# Patient Record
Sex: Female | Born: 1950
Health system: Southern US, Community
[De-identification: ages and names within clinical notes are randomized; demographics above are authoritative.]

## PROBLEM LIST (undated history)

## (undated) DIAGNOSIS — G8929 Other chronic pain: Secondary | ICD-10-CM

## (undated) DIAGNOSIS — K601 Chronic anal fissure: Secondary | ICD-10-CM

## (undated) DIAGNOSIS — M545 Low back pain, unspecified: Secondary | ICD-10-CM

## (undated) DIAGNOSIS — K641 Second degree hemorrhoids: Secondary | ICD-10-CM

## (undated) DIAGNOSIS — Z973 Presence of spectacles and contact lenses: Secondary | ICD-10-CM

## (undated) DIAGNOSIS — K644 Residual hemorrhoidal skin tags: Secondary | ICD-10-CM

## (undated) DIAGNOSIS — F419 Anxiety disorder, unspecified: Secondary | ICD-10-CM

## (undated) DIAGNOSIS — Z86010 Personal history of colonic polyps: Secondary | ICD-10-CM

## (undated) DIAGNOSIS — Z860101 Personal history of adenomatous and serrated colon polyps: Secondary | ICD-10-CM

## (undated) DIAGNOSIS — I10 Essential (primary) hypertension: Secondary | ICD-10-CM

## (undated) DIAGNOSIS — M1990 Unspecified osteoarthritis, unspecified site: Secondary | ICD-10-CM

## (undated) HISTORY — DX: Essential (primary) hypertension: I10

## (undated) HISTORY — DX: Anxiety disorder, unspecified: F41.9

## (undated) HISTORY — PX: BREAST BIOPSY: SHX20

## (undated) HISTORY — PX: OTHER SURGICAL HISTORY: SHX169

---

## 2014-02-23 ENCOUNTER — Emergency Department (HOSPITAL_COMMUNITY)
Admission: EM | Admit: 2014-02-23 | Discharge: 2014-02-23 | Disposition: A | Discharge status: Home / Self Care | Payer: Self-pay | Attending: Emergency Medicine | Admitting: Emergency Medicine

## 2014-02-23 ENCOUNTER — Encounter (HOSPITAL_COMMUNITY): Payer: Self-pay | Admitting: Emergency Medicine

## 2014-02-23 DIAGNOSIS — M5430 Sciatica, unspecified side: Secondary | ICD-10-CM | POA: Insufficient documentation

## 2014-02-23 DIAGNOSIS — Z880 Allergy status to penicillin: Secondary | ICD-10-CM | POA: Insufficient documentation

## 2014-02-23 DIAGNOSIS — R030 Elevated blood-pressure reading, without diagnosis of hypertension: Secondary | ICD-10-CM

## 2014-02-23 DIAGNOSIS — M25559 Pain in unspecified hip: Secondary | ICD-10-CM | POA: Insufficient documentation

## 2014-02-23 DIAGNOSIS — IMO0001 Reserved for inherently not codable concepts without codable children: Secondary | ICD-10-CM

## 2014-02-23 DIAGNOSIS — M5442 Lumbago with sciatica, left side: Secondary | ICD-10-CM

## 2014-02-23 DIAGNOSIS — M25569 Pain in unspecified knee: Secondary | ICD-10-CM | POA: Insufficient documentation

## 2014-02-23 DIAGNOSIS — I10 Essential (primary) hypertension: Secondary | ICD-10-CM | POA: Insufficient documentation

## 2014-02-23 MED ORDER — CYCLOBENZAPRINE HCL 10 MG PO TABS
10.0000 mg | ORAL_TABLET | Freq: Once | ORAL | Status: AC
Start: 2014-02-23 — End: 2014-02-23
  Administered 2014-02-23: 10 mg via ORAL
  Filled 2014-02-23: qty 1

## 2014-02-23 MED ORDER — HYDROCODONE-ACETAMINOPHEN 5-325 MG PO TABS: 1.0000 | ORAL_TABLET | ORAL | Status: DC | PRN

## 2014-02-23 MED ORDER — CYCLOBENZAPRINE HCL 10 MG PO TABS: 10.0000 mg | ORAL_TABLET | Freq: Two times a day (BID) | ORAL | Status: DC | PRN

## 2014-02-23 MED ORDER — HYDROCODONE-ACETAMINOPHEN 5-325 MG PO TABS
2.0000 | ORAL_TABLET | Freq: Once | ORAL | Status: AC
  Administered 2014-02-23: 2 via ORAL

## 2014-02-23 MED ORDER — IBUPROFEN 800 MG PO TABS: 800.0000 mg | ORAL_TABLET | Freq: Once | ORAL | Status: DC

## 2014-02-23 NOTE — ED Notes (Signed)
Pt /o lower back pain that radiates down left leg that started Wednesday. Denies any injury, pain is better after taking ibuprofen prior to arrival in er. Positive distal pulse present

## 2014-02-23 NOTE — ED Notes (Signed)
Pt c/o lower back pain that radiates down left leg. Pt describes pain as sharp and shooting. Pain began Wednesday morning. Pt denies hx of back pain. Denies any injury or strenuous activity prior.

## 2014-02-23 NOTE — ED Provider Notes (Signed)
CSN: [id number]     Arrival date & time 02/23/14  0900 History   First MD Initiated Contact with Patient 02/23/14 [phone]     Chief Complaint  Patient presents with  . Back Pain     (Consider location/radiation/quality/duration/timing/severity/associated sxs/prior Treatment) HPI Pt is a 63yo female presenting to ED today c/o left lower back and hip pain that started 5 days ago when she woke up.  Pain is constant, aching and sharp, waxing and waning, 10/10 at worse. Pain radiates from left lower back and buttock into left knee.  She has taken 800mg  ibuprofen with mild to moderate relief. Denies heavy lifting or falls. Denies hx of similar pain. Pt does report she does a lot of sitting during the day. Denies fever, n/v/d. Denies change in bowel or bladder habits. Pt does not have a PCP.    History reviewed. No pertinent past medical history. History reviewed. No pertinent past surgical history. History reviewed. No pertinent family history. History  Substance Use Topics  . Smoking status: Never Smoker   . Smokeless tobacco: Not on file  . Alcohol Use: No   OB History   Grav Para Term Preterm Abortions TAB SAB Ect Mult Living                 Review of Systems  Constitutional: Negative for fever, chills, diaphoresis and fatigue.  Respiratory: Negative for shortness of breath.   Cardiovascular: Negative for chest pain.  Gastrointestinal: Negative for nausea, vomiting and abdominal pain.  Genitourinary: Negative for dysuria, urgency, frequency, hematuria, flank pain and pelvic pain.  Musculoskeletal: Positive for arthralgias ( left hip), back pain and myalgias. Negative for neck pain and neck stiffness.  Skin: Negative for rash and wound.  All other systems reviewed and are negative.      Allergies  Codeine and Penicillins  Home Medications   Current Outpatient Rx  Name  Route  Sig  Dispense  Refill  . cyclobenzaprine (FLEXERIL) 10 MG tablet   Oral   Take 1 tablet (10 mg  total) by mouth 2 (two) times daily as needed for muscle spasms.   20 tablet   0   . HYDROcodone-acetaminophen (NORCO/VICODIN) 5-325 MG per tablet   Oral   Take 1-2 tablets by mouth every 4 (four) hours as needed for moderate pain or severe pain.   10 tablet   0    BP 175/89  Pulse 74  Temp(Src) 98.6 F (37 C) (Oral)  Resp 20  Ht 5\' 4"  (1.626 m)  Wt 195 lb (88.451 kg)  BMI 33.46 kg/m2  SpO2 96% Physical Exam  Nursing note and vitals reviewed. Constitutional: She appears well-developed and well-nourished. No distress.  HENT:  Head: Normocephalic and atraumatic.  Eyes: Conjunctivae are normal. No scleral icterus.  Neck: Normal range of motion.  Cardiovascular: Normal rate, regular rhythm and normal heart sounds.   Pulmonary/Chest: Effort normal. No respiratory distress.  Abdominal: Soft. She exhibits no distension. There is no tenderness.  Musculoskeletal: Normal range of motion. She exhibits tenderness. She exhibits no edema.  Tenderness in left buttock over sciatic notch and posterio-lateral left leg.   Neurological: She is alert.  Skin: Skin is warm and dry. No rash noted. She is not diaphoretic. No erythema.  Psychiatric: She has a normal mood and affect. Her behavior is normal.     ED Course  Procedures (including critical care time) Labs Review Labs Reviewed - No data to display Imaging Review No results found.  EKG Interpretation None      MDM   Final diagnoses:  Left-sided low back pain with sciatica  Elevated BP    Pt c/o left lower back and hip pain radiating into her left knee. Denies hx of trauma. No red flag symptoms.  Do not believe imaging needed at this time. Not concerned for emergent process taking place. Will tx symptomatically as needed for pain. Symptoms likely due to sciatica.  In triage pt's BP was 189/128.  Pt denies hx of HTN but she does not have a PCP. Pain medication given in ED and pt allowed to relax before recheck of BP.  Recheck-  175/89.  Will discharge pt home. Rx: flexeril, norco, and advised to continue taking ibuprofen. Strongly encouraged pt f/u with PCP for elevated BP and ongoing healthcare needs, resource guide provided.  Return precautions provided. Pt verbalized understanding and agreement with tx plan.      Noland Fordyce, PA-C 02/23/14 1006

## 2014-02-23 NOTE — ED Notes (Signed)
Erin PA at bedside,

## 2014-02-23 NOTE — ED Provider Notes (Signed)
Medical screening examination/treatment/procedure(s) were performed by non-physician practitioner and as supervising physician I was immediately available for consultation/collaboration.   EKG Interpretation None       Nat Christen, MD 02/23/14 1511

## 2014-02-23 NOTE — Discharge Instructions (Signed)
Emergency Department Resource Guide 1) Find a Doctor and Pay Out of Pocket Although you won't have to find out who is covered by your insurance plan, it is a good idea to ask around and get recommendations. You will then need to call the office and see if the doctor you have chosen will accept you as a new patient and what types of options they offer for patients who are self-pay. Some doctors offer discounts or will set up payment plans for their patients who do not have insurance, but you will need to ask so you aren't surprised when you get to your appointment.  2) Contact Your Local Health Department Not all health departments have doctors that can see patients for sick visits, but many do, so it is worth a call to see if yours does. If you don't know where your local health department is, you can check in your phone book. The CDC also has a tool to help you locate your state's health department, and many state websites also have listings of all of their local health departments.  3) Find a [facility] If your illness is not likely to be very severe or complicated, you may want to try a walk in clinic. These are popping up all over the country in pharmacies, drugstores, and shopping centers. They're usually staffed by nurse practitioners or physician assistants that have been trained to treat common illnesses and complaints. They're usually fairly quick and inexpensive. However, if you have serious medical issues or chronic medical problems, these are probably not your best option.  No Primary Care Doctor: - Call Health Connect at  [phone] - they can help you locate a primary care doctor that  accepts your insurance, provides certain services, etc. - Physician Referral Service- [phone]  Chronic Pain Problems: Organization         Address  Phone   Notes  [facility]  [phone] Patients need to be referred by their primary care doctor.   Medication  Assistance: Organization         Address  Phone   Notes  [facility] Medication [facility] [address] [place], [place] [address] [phone] --Must be a resident of [facility] -- Must have NO insurance coverage whatsoever (no Medicaid/ Medicare, etc.) -- The pt. MUST have a primary care doctor that directs their care regularly and follows them in the community   [facility]  [phone]   [employer]  [phone]    Agencies that provide inexpensive medical care: Organization         Address                                                       Phone                                                                            Notes  [place]  [phone]   [facility]    [phone])  [facility] [place], [place] [address] [phone]   [place]. [address], [place] [phone]   [facility]    [phone]   [facility]    [phone]   [place] and [place] [address], [place] Phone:  [phone], Fax:  [phone] Hours of Operation:  9 am - 6 pm, M-F.  Also accepts Medicaid/Medicare and self-pay.  [facility] for [address] [place], [address], [place] Phone: [phone], Fax: [phone]. Hours of Operation:  8:30 am - 5:30 pm, M-F.  Also accepts Medicaid and self-pay.  [facility] [address], [place] Phone: [phone]   [place], [place], [place] [phone], Ext. [phone] Mondays & Thursdays: 7-9 AM.  First 15 patients are seen on a first come, first serve basis.    [place] Providers:  Organization         Address                                                                       Phone                               Notes  [place] [facility] [address], [place] [phone] Also accepts self-pay patients.  [facility] [address], [place]  [phone]   [place], [address], [place] [phone]   [facility] [address] [place] [phone]   Lucianne Lei [address], [place]   [phone] Only accepts [place] Access [place] patients after they have their name applied to their card.   Self-Pay (no insurance) in [facility]:   Organization         Address                                                     Phone               Notes  Sickle Cell Patients, [facility] [place] [phone]   [facility] [place] [phone]   [facility] [place]  [place], [place], [place] [phone]   [facility]/Dr. Osei-Bonsu  [address] [place] or [address], [place] [phone] Phone number for both [place] and [place] locations is the same.  [facility] and [facility] [address] [place] [phone]   [facility] [address] [place] or [address] [phone]   [facility] [place], [place] [phone], phone; [phone], fax Sees patients 1st and 3rd Saturday of  every month.  Must not qualify for public or private insurance (i.e. Medicaid, Medicare, [place] Health Choice, Veterans' Benefits)  Household income should be no more than 200% of the poverty level The clinic cannot treat you if you are pregnant or think you are pregnant  Sexually transmitted diseases are not treated at the clinic.    Dental Care: Organization         Address                                  Phone                       Notes  [facility] Department of [facility] [place] [phone] Accepts children up to age 9 who are enrolled in [place] or [place]; pregnant women with a Medicaid card; and children who have applied for Medicaid or [place] Health Choice, but were declined, whose parents can pay a reduced fee at time of service.  [facility] Department of [facility]  [address], [place] [phone] Accepts children up to age 24 who are enrolled in [place] or [place]; pregnant women with a Medicaid card; and children who have applied for Medicaid or [place] Health Choice, but were declined, whose parents can pay a reduced fee at time of service.  [facility]  [place] [phone] Patients are seen by appointment only. Walk-ins are not accepted. [place] will see patients 45 years of age and older. Monday - Tuesday (8am-5pm) Most Wednesdays (8:30-5pm) $30 per visit, cash only  [facility] Adult Dental Access PROGRAM  [address], [facility] [phone] Patients are seen by appointment only. Walk-ins are not accepted. [place] will see patients 27 years of age and older. One Wednesday Evening (Monthly: Volunteer Based).  $30 per visit, cash only  [place]  [phone] for adults; Children under age 15, call Graduate Pediatric Dentistry at [phone]. Children aged 75-14, please call [phone] to request a pediatric application.  Dental services are provided in all areas of dental care including fillings, crowns and bridges, complete and partial dentures, implants, gum treatment, root canals, and extractions. Preventive care is also provided. Treatment is provided to both adults and children. Patients are selected via a lottery and there is often a waiting list.   [facility] [address], [place]  [phone] [email or web address]   [facility] [address] [place], [place] [phone], Ext. [phone] Second and Fourth Thursday of each month, opens at 6:30 AM; Clinic ends at 9 AM.  Patients are seen on a first-come first-served basis, and a limited number are seen during each clinic.   [facility]  [address] [place], [place] [phone]   Eligibility Requirements You must have lived in [place], [place], or [place] counties for at least the last three months.   You cannot be eligible for state or federal sponsored [employer], including [employer], [place], or [employer].   You generally cannot be eligible for healthcare insurance through your employer.    How to apply: Eligibility screenings are held every Tuesday and Wednesday afternoon from 1:00 pm until 4:00 pm. You do not need an appointment for the interview!  [facility] [address], [place], [place] [phone]   °[facility]  [phone]   °[facility]  [phone]   °[facility]  [phone]   ° °

## 2015-06-10 ENCOUNTER — Emergency Department (HOSPITAL_COMMUNITY)
Admission: EM | Admit: 2015-06-10 | Discharge: 2015-06-10 | Disposition: A | Discharge status: Home / Self Care | Payer: Self-pay | Attending: Emergency Medicine | Admitting: Emergency Medicine

## 2015-06-10 ENCOUNTER — Encounter (HOSPITAL_COMMUNITY): Payer: Self-pay | Admitting: Emergency Medicine

## 2015-06-10 ENCOUNTER — Emergency Department (HOSPITAL_COMMUNITY): Payer: Self-pay

## 2015-06-10 DIAGNOSIS — M5441 Lumbago with sciatica, right side: Secondary | ICD-10-CM | POA: Insufficient documentation

## 2015-06-10 DIAGNOSIS — M5431 Sciatica, right side: Secondary | ICD-10-CM

## 2015-06-10 DIAGNOSIS — Z720 Tobacco use: Secondary | ICD-10-CM | POA: Insufficient documentation

## 2015-06-10 DIAGNOSIS — Z880 Allergy status to penicillin: Secondary | ICD-10-CM | POA: Insufficient documentation

## 2015-06-10 DIAGNOSIS — R200 Anesthesia of skin: Secondary | ICD-10-CM | POA: Insufficient documentation

## 2015-06-10 DIAGNOSIS — Z79899 Other long term (current) drug therapy: Secondary | ICD-10-CM | POA: Insufficient documentation

## 2015-06-10 MED ORDER — HYDROCHLOROTHIAZIDE 25 MG PO TABS: 25.0000 mg | ORAL_TABLET | Freq: Every day | ORAL | Status: DC

## 2015-06-10 MED ORDER — METHOCARBAMOL 500 MG PO TABS: 500.0000 mg | ORAL_TABLET | Freq: Two times a day (BID) | ORAL | Status: DC

## 2015-06-10 MED ORDER — HYDROCODONE-ACETAMINOPHEN 5-325 MG PO TABS: 2.0000 | ORAL_TABLET | ORAL | Status: DC | PRN

## 2015-06-10 MED ORDER — PREDNISONE 10 MG PO TABS: ORAL_TABLET | ORAL | Status: DC

## 2015-06-10 NOTE — ED Notes (Signed)
Pt reports chronic lower back pain flare-up radiating to right leg "since end of April". Pt denies any new or recent injury. nad noted.

## 2015-06-10 NOTE — Discharge Instructions (Signed)
Back Pain, Adult Back pain is very common. The pain often gets better over time. The cause of back pain is usually not dangerous. Most people can learn to manage their back pain on their own.  HOME CARE   Stay active. Start with short walks on flat ground if you can. Try to walk farther each day.  Do not sit, drive, or stand in one place for more than 30 minutes. Do not stay in bed.  Do not avoid exercise or work. Activity can help your back heal faster.  Be careful when you bend or lift an object. Bend at your knees, keep the object close to you, and do not twist.  Sleep on a firm mattress. Lie on your side, and bend your knees. If you lie on your back, put a pillow under your knees.  Only take medicines as told by your doctor.  Put ice on the injured area.  Put ice in a plastic bag.  Place a towel between your skin and the bag.  Leave the ice on for 15-20 minutes, 03-04 times a day for the first 2 to 3 days. After that, you can switch between ice and heat packs.  Ask your doctor about back exercises or massage.  Avoid feeling anxious or stressed. Find good ways to deal with stress, such as exercise. GET HELP RIGHT AWAY IF:   Your pain does not go away with rest or medicine.  Your pain does not go away in 1 week.  You have new problems.  You do not feel well.  The pain spreads into your legs.  You cannot control when you poop (bowel movement) or pee (urinate).  Your arms or legs feel weak or lose feeling (numbness).  You feel sick to your stomach (nauseous) or throw up (vomit).  You have belly (abdominal) pain.  You feel like you may pass out (faint). MAKE SURE YOU:   Understand these instructions.  Will watch your condition.  Will get help right away if you are not doing well or get worse. Document Released: 05/09/2008 Document Revised: 02/13/2012 Document Reviewed: 03/25/2014 [facility] Patient Information 2015 [place], [place]. This information is not intended  to replace advice given to you by your health care provider. Make sure you discuss any questions you have with your health care provider. Sciatica Sciatica is pain, weakness, numbness, or tingling along the path of the sciatic nerve. The nerve starts in the lower back and runs down the back of each leg. The nerve controls the muscles in the lower leg and in the back of the knee, while also providing sensation to the back of the thigh, lower leg, and the sole of your foot. Sciatica is a symptom of another medical condition. For instance, nerve damage or certain conditions, such as a herniated disk or bone spur on the spine, pinch or put pressure on the sciatic nerve. This causes the pain, weakness, or other sensations normally associated with sciatica. Generally, sciatica only affects one side of the body. CAUSES   Herniated or slipped disc.  Degenerative disk disease.  A pain disorder involving the narrow muscle in the buttocks (piriformis syndrome).  Pelvic injury or fracture.  Pregnancy.  Tumor (rare). SYMPTOMS  Symptoms can vary from mild to very severe. The symptoms usually travel from the low back to the buttocks and down the back of the leg. Symptoms can include:  Mild tingling or dull aches in the lower back, leg, or hip.  Numbness in the back  of the calf or sole of the foot.  Burning sensations in the lower back, leg, or hip.  Sharp pains in the lower back, leg, or hip.  Leg weakness.  Severe back pain inhibiting movement. These symptoms may get worse with coughing, sneezing, laughing, or prolonged sitting or standing. Also, being overweight may worsen symptoms. DIAGNOSIS  Your caregiver will perform a physical exam to look for common symptoms of sciatica. He or she may ask you to do certain movements or activities that would trigger sciatic nerve pain. Other tests may be performed to find the cause of the sciatica. These may include:  Blood tests.  X-rays.  Imaging  tests, such as an MRI or CT scan. TREATMENT  Treatment is directed at the cause of the sciatic pain. Sometimes, treatment is not necessary and the pain and discomfort goes away on its own. If treatment is needed, your caregiver may suggest:  Over-the-counter medicines to relieve pain.  Prescription medicines, such as anti-inflammatory medicine, muscle relaxants, or narcotics.  Applying heat or ice to the painful area.  Steroid injections to lessen pain, irritation, and inflammation around the nerve.  Reducing activity during periods of pain.  Exercising and stretching to strengthen your abdomen and improve flexibility of your spine. Your caregiver may suggest losing weight if the extra weight makes the back pain worse.  Physical therapy.  Surgery to eliminate what is pressing or pinching the nerve, such as a bone spur or part of a herniated disk. HOME CARE INSTRUCTIONS   Only take over-the-counter or prescription medicines for pain or discomfort as directed by your caregiver.  Apply ice to the affected area for 20 minutes, 3-4 times a day for the first 48-72 hours. Then try heat in the same way.  Exercise, stretch, or perform your usual activities if these do not aggravate your pain.  Attend physical therapy sessions as directed by your caregiver.  Keep all follow-up appointments as directed by your caregiver.  Do not wear high heels or shoes that do not provide proper support.  Check your mattress to see if it is too soft. A firm mattress may lessen your pain and discomfort. SEEK IMMEDIATE MEDICAL CARE IF:   You lose control of your bowel or bladder (incontinence).  You have increasing weakness in the lower back, pelvis, buttocks, or legs.  You have redness or swelling of your back.  You have a burning sensation when you urinate.  You have pain that gets worse when you lie down or awakens you at night.  Your pain is worse than you have experienced in the past.  Your  pain is lasting longer than 4 weeks.  You are suddenly losing weight without reason. MAKE SURE YOU:  Understand these instructions.  Will watch your condition.  Will get help right away if you are not doing well or get worse. Document Released: 11/15/2001 Document Revised: 05/22/2012 Document Reviewed: 04/01/2012 [facility] Patient Information 2015 [place], [place]. This information is not intended to replace advice given to you by your health care provider. Make sure you discuss any questions you have with your health care provider. DASH Eating Plan DASH stands for "Dietary Approaches to Stop Hypertension." The DASH eating plan is a healthy eating plan that has been shown to reduce high blood pressure (hypertension). Additional health benefits may include reducing the risk of type 2 diabetes mellitus, heart disease, and stroke. The DASH eating plan may also help with weight loss. WHAT DO I NEED TO KNOW ABOUT THE  DASH EATING PLAN? For the DASH eating plan, you will follow these general guidelines:  Choose foods with a percent daily value for sodium of less than 5% (as listed on the food label).  Use salt-free seasonings or herbs instead of table salt or sea salt.  Check with your health care provider or pharmacist before using salt substitutes.  Eat lower-sodium products, often labeled as "lower sodium" or "no salt added."  Eat fresh foods.  Eat more vegetables, fruits, and low-fat dairy products.  Choose whole grains. Look for the word "whole" as the first word in the ingredient list.  Choose fish and skinless chicken or [place] more often than red meat. Limit fish, poultry, and meat to 6 oz (170 g) each day.  Limit sweets, desserts, sugars, and sugary drinks.  Choose heart-healthy fats.  Limit cheese to 1 oz (28 g) per day.  Eat more home-cooked food and less restaurant, buffet, and fast food.  Limit fried foods.  Cook foods using methods other than frying.  Limit canned  vegetables. If you do use them, rinse them well to decrease the sodium.  When eating at a restaurant, ask that your food be prepared with less salt, or no salt if possible. WHAT FOODS CAN I EAT? Seek help from a dietitian for individual calorie needs. Grains Whole grain or whole wheat bread. Brown rice. Whole grain or whole wheat pasta. Quinoa, bulgur, and whole grain cereals. Low-sodium cereals. Corn or whole wheat flour tortillas. Whole grain cornbread. Whole grain crackers. Low-sodium crackers. Vegetables Fresh or frozen vegetables (raw, steamed, roasted, or grilled). Low-sodium or reduced-sodium tomato and vegetable juices. Low-sodium or reduced-sodium tomato sauce and paste. Low-sodium or reduced-sodium canned vegetables.  Fruits All fresh, canned (in natural juice), or frozen fruits. Meat and Other Protein Products Ground beef (85% or leaner), grass-fed beef, or beef trimmed of fat. Skinless chicken or [place]. Ground chicken or [place]. Pork trimmed of fat. All fish and seafood. Eggs. Dried beans, peas, or lentils. Unsalted nuts and seeds. Unsalted canned beans. Dairy Low-fat dairy products, such as skim or 1% milk, 2% or reduced-fat cheeses, low-fat ricotta or cottage cheese, or plain low-fat yogurt. Low-sodium or reduced-sodium cheeses. Fats and Oils Tub margarines without trans fats. Light or reduced-fat mayonnaise and salad dressings (reduced sodium). Avocado. Safflower, olive, or canola oils. Natural peanut or almond butter. Other Unsalted popcorn and pretzels. The items listed above may not be a complete list of recommended foods or beverages. Contact your dietitian for more options. WHAT FOODS ARE NOT RECOMMENDED? Grains White bread. White pasta. White rice. Refined cornbread. Bagels and croissants. Crackers that contain trans fat. Vegetables Creamed or fried vegetables. Vegetables in a cheese sauce. Regular canned vegetables. Regular canned tomato sauce and paste. Regular tomato  and vegetable juices. Fruits Dried fruits. Canned fruit in light or heavy syrup. Fruit juice. Meat and Other Protein Products Fatty cuts of meat. Ribs, chicken wings, bacon, sausage, bologna, salami, chitterlings, fatback, hot dogs, bratwurst, and packaged luncheon meats. Salted nuts and seeds. Canned beans with salt. Dairy Whole or 2% milk, cream, half-and-half, and cream cheese. Whole-fat or sweetened yogurt. Full-fat cheeses or blue cheese. Nondairy creamers and whipped toppings. Processed cheese, cheese spreads, or cheese curds. Condiments Onion and garlic salt, seasoned salt, table salt, and sea salt. Canned and packaged gravies. Worcestershire sauce. Tartar sauce. Barbecue sauce. Teriyaki sauce. Soy sauce, including reduced sodium. Steak sauce. Fish sauce. Oyster sauce. Cocktail sauce. Horseradish. Ketchup and mustard. Meat flavorings and tenderizers. Bouillon cubes. Hot  sauce. Tabasco sauce. Marinades. Taco seasonings. Relishes. Fats and Oils Butter, stick margarine, lard, shortening, ghee, and bacon fat. Coconut, palm kernel, or palm oils. Regular salad dressings. Other Pickles and olives. Salted popcorn and pretzels. The items listed above may not be a complete list of foods and beverages to avoid. Contact your dietitian for more information. WHERE CAN I FIND MORE INFORMATION? National Heart, Lung, and Blood Institute: travelstabloid.com Document Released: 11/10/2011 Document Revised: 04/07/2014 Document Reviewed: 09/25/2013 [facility] Patient Information 2015 [place], [place]. This information is not intended to replace advice given to you by your health care provider. Make sure you discuss any questions you have with your health care provider.  Emergency Department Resource Guide 1) Find a Doctor and Pay Out of Pocket Although you won't have to find out who is covered by your insurance plan, it is a good idea to ask around and get recommendations. You  will then need to call the office and see if the doctor you have chosen will accept you as a new patient and what types of options they offer for patients who are self-pay. Some doctors offer discounts or will set up payment plans for their patients who do not have insurance, but you will need to ask so you aren't surprised when you get to your appointment.  2) Contact Your Local Health Department Not all health departments have doctors that can see patients for sick visits, but many do, so it is worth a call to see if yours does. If you don't know where your local health department is, you can check in your phone book. The CDC also has a tool to help you locate your state's health department, and many state websites also have listings of all of their local health departments.  3) Find a [facility] [facility] If your illness is not likely to be very severe or complicated, you may want to try a walk in clinic. These are popping up all over the country in pharmacies, drugstores, and shopping centers. They're usually staffed by nurse practitioners or physician assistants that have been trained to treat common illnesses and complaints. They're usually fairly quick and inexpensive. However, if you have serious medical issues or chronic medical problems, these are probably not your best option.  No Primary Care Doctor: - Call Health Connect at  [phone] - they can help you locate a primary care doctor that  accepts your insurance, provides certain services, etc. - Physician Referral Service- [phone]  Chronic Pain Problems: Organization         Address  Phone   Notes  [facility]  [phone] Patients need to be referred by their primary care doctor.   Medication Assistance: Organization         Address  Phone   Notes  [facility] Medication [facility] [address] [place], [place] [address] [phone] --Must be a resident of [facility] -- Must  have NO insurance coverage whatsoever (no Medicaid/ Medicare, etc.) -- The pt. MUST have a primary care doctor that directs their care regularly and follows them in the community   MedAssist  [phone]   [employer]  [phone]    Agencies that provide inexpensive medical care: Organization         Address  Phone   Notes  [facility]  [phone]   [facility]    [phone]   [facility]  [place], [place] [address] [phone]   [facility] [place] [address], [place] [phone]   Planned Parenthood    [phone]   [facility]    [phone]   [place] and [place] [address], [place] Phone:  [phone], Fax:  [phone] Hours of Operation:  9 am - 6 pm, M-F.  Also accepts Medicaid/Medicare and self-pay.  [facility] for [place], [address], [place] Phone: [phone], Fax: [phone]. Hours of Operation:  8:30 am - 5:30 pm, M-F.  Also accepts Medicaid and self-pay.  [facility] [place] [address], [place] Phone: [phone]   [place], [place], [place] [phone], Ext. [phone] Mondays & Thursdays: 7-9 AM.  First 15 patients are seen on a first come, first serve basis.    [place] Providers:  Organization         Address  Phone   Notes  [facility] [address],  [phone] Also accepts self-pay patients.  [facility] [address], [place]  [phone]   [place], [address], [place] [phone]   [facility] [address] [place] [phone]   Lucianne Lei [address], [place]   [phone] Only accepts [place] Access [place] patients after  they have their name applied to their card.   Self-Pay (no insurance) in [facility]:  Organization         Address  Phone   Notes  Sickle Cell Patients, [facility] [place] [phone]   [facility] [place] [phone]   [facility] [place]  [place], [place],  [phone]   [facility]/Dr. Osei-Bonsu  [address] [place] or [address], [place] [phone] Phone number for both [place] and [place] locations is the same.  [facility] and [facility] [address] [place] [phone]   [facility] [address] [place] or [address] [phone]   [facility] [address] [place] [phone], phone; [phone], fax Sees patients 1st and 3rd Saturday of every month.  Must not qualify for public or private insurance (i.e. Medicaid, Medicare, [place] Health Choice, Veterans' Benefits)  Household income should be no more than 200% of the poverty level The clinic cannot treat you if you are pregnant or think you are pregnant  Sexually transmitted diseases are not treated at the clinic.    Dental Care: Organization         Address  Phone  Notes  [facility] Department of [facility] [place] [phone] Accepts children up to age 84 who are enrolled in [place] or [place]; pregnant women with a Medicaid card; and children who have applied for Medicaid or [place] Health Choice, but were declined, whose parents can pay a reduced fee at time of service.  [facility] Department of [facility]  [address], [place] [phone] Accepts children up to age 28 who are enrolled in [place] or [place]  Choice; pregnant women with a Medicaid card; and children who  have applied for Medicaid or [place] Health Choice, but were declined, whose parents can pay a reduced fee at time of service.  [place] Adult [facility] Access PRO[facility]  [place] [phone] Patients are seen by appointment only. Walk-ins are not accepted. [place] will see patients 76 years of age and older. Monday - Tuesday (8am-5pm) Most Wednesdays (8:30-5pm) $30 per visit, cash only  [facility] Adult Dental Access PROGRAM  [address], [facility] [phone] Patients are seen by appointment only. Walk-ins are not accepted. [place] will see patients 31 years of age and older. One Wednesday Evening (Monthly: Volunteer Based).  $30 per visit, cash only  [place]  [phone] for adults; Children under age 10, call Graduate Pediatric Dentistry at [phone]. Children aged 17-14, please call [phone] to request a pediatric application.  Dental services are provided in all areas of dental care including fillings, crowns and bridges, complete and partial dentures, implants, gum treatment, root canals, and extractions. Preventive care is also provided. Treatment is provided to both adults and children. Patients are selected via a lottery and there is often a waiting list.   [facility] [address] [place]  [phone] [email or web address]   [facility] [address] [place], [place] [phone], Ext. [phone] Second and Fourth Thursday of each month, opens at 6:30 AM; Clinic ends at 9 AM.  Patients are seen on a first-come first-served basis, and a limited number are seen during each clinic.   [facility]  [address] [place], [place] [phone]   Eligibility Requirements You must have lived in [place], [place], or [place] counties for at least the last three months.   You cannot be eligible for state or federal sponsored [employer], including [employer], [place], or [employer].   You generally cannot be eligible for healthcare insurance through your employer.    How to apply: Eligibility screenings are held every Tuesday and Wednesday afternoon from 1:00 pm until 4:00 pm. You do not need an appointment for the interview!  [facility] [address] [place], [place]   [place]  [place] Department  [place]  [phone]    Behavioral Health Resources in the Community: Intensive Outpatient Programs Organization         Address  Phone  Notes  Blaine Baggs. [address] [place], [place] [phone]   [facility] Outpatient [address] [place], [place]   ADS: Alcohol & Drug Svcs [address], [place], [place]   [place] [address],  [place], [place] or [phone]   Substance Abuse Resources Organization         Address  Phone  Notes  Alcohol and Drug Services  [phone]   [place]  [phone]   [place]   Chinita Pester  [phone]   Residential & Outpatient Substance Abuse Program  [phone]   Psychological Services Organization         Address  Phone  Notes  [facility] [place]  [place]  [phone]   [place] [address], [place] or [phone]  Mobile Crisis Teams Organization         Address  Phone  Notes  [facility], Mobile Crisis Care Unit  [phone]   [facility]  [address] [place], [place]   [place] Levels [address], [place] [phone]    Self-Help/Support Groups Organization         Address  Phone             Notes  [place]. of [place] -  variety of support groups  [place] Call for more information  Narcotics Anonymous (NA), [facility] [address], [employer] [place]  2 meetings at this location   Special educational needs teacher         Address  Phone  Notes  [facility] Residential Treatment [place],    [place]  [phone]   [facility]  [address] [place] [address], [place], [place]   [place], [place] [phone] Admissions: 8am-3pm M-F  Incentives Substance [place] [address]    [place], [place] [phone]   [facility] [address] [place], [place], [place]   [facility] [address],  [place], [place]   [facility] - Intensive Outpatient [address], [place], [place]   [facility] [place] [place].,  [place], [place] [phone] or [phone]   [facility] Treatment Services (RTS) [address] [place], [place] Accepts Medicaid  [facility] [place] [address]  [place] [phone] Substance Abuse/Addiction Treatment   [facility] Organization         Address  Phone  Notes  [facility]  [phone]   Domenic Schwab, PhD [address] [place], [place]   [phone] or [phone]   [place], [place] [phone]   [facility] [address] [place], [place] [phone] Insurance/Medicaid/sponsorship through [facility] [address]                                    [place], [place] [phone] [place] [address] [place], [place] [phone]    Dr. Adele Schilder  [phone]   [facility] Dept. 1) [address], [place] 2) [place] 3)  [place] [address], [place] [phone]  [phone]   [place] [phone] or [phone] (After Hours)

## 2015-06-10 NOTE — ED Provider Notes (Signed)
CSN: [id number]     Arrival date & time 06/10/15  1151 History   First MD Initiated Contact with Patient 06/10/15 1224     Chief Complaint  Patient presents with  . Back Pain     (Consider location/radiation/quality/duration/timing/severity/associated sxs/prior Treatment) Patient is a 64 y.o. female presenting with back pain. The history is provided by the patient. No language interpreter was used.  Back Pain Location:  Lumbar spine Quality:  Aching Radiates to:  R posterior upper leg Pain severity:  Moderate Pain is:  Unable to specify Onset quality:  Gradual Timing:  Constant Progression:  Worsening Chronicity:  New Context: not recent injury and not twisting   Relieved by:  Nothing Worsened by:  Nothing tried Ineffective treatments:  None tried Associated symptoms: numbness   Risk factors: no vascular disease     Past Medical History  Diagnosis Date  . Back pain    History reviewed. No pertinent past surgical history. History reviewed. No pertinent family history. History  Substance Use Topics  . Smoking status: Light Tobacco Smoker  . Smokeless tobacco: Not on file  . Alcohol Use: Yes     Comment: occasional   OB History    No data available     Review of Systems  Musculoskeletal: Positive for back pain.  Neurological: Positive for numbness.  All other systems reviewed and are negative.     Allergies  Codeine and Penicillins  Home Medications   Prior to Admission medications   Medication Sig Start Date End Date Taking? Authorizing Provider  HYDROcodone-acetaminophen (NORCO/VICODIN) 5-325 MG per tablet Take 1-2 tablets by mouth every 4 (four) hours as needed for moderate pain or severe pain. 02/23/14  Yes Noland Fordyce, PA-C  ibuprofen (ADVIL,MOTRIN) 200 MG tablet Take 800 mg by mouth every 6 (six) hours as needed.   Yes Historical Provider, MD  methocarbamol (ROBAXIN) 500 MG tablet Take 500 mg by mouth 4 (four) times daily.   Yes Historical Provider,  MD   BP 231/96 mmHg  Pulse 83  Temp(Src) 98.3 F (36.8 C) (Oral)  Resp 18  Ht 5\' 4"  (1.626 m)  Wt 183 lb (83.008 kg)  BMI 31.40 kg/m2  SpO2 99% Physical Exam  Constitutional: She appears well-developed and well-nourished.  HENT:  Head: Normocephalic.  Eyes: Pupils are equal, round, and reactive to light.  Neck: Normal range of motion.  Cardiovascular: Normal rate.   Pulmonary/Chest: Effort normal.  Abdominal: Soft.  Musculoskeletal: She exhibits tenderness.  Tender lower lumbar,   nv and ns intact  Neurological: She is alert.  Skin: Skin is warm.  Psychiatric: She has a normal mood and affect.  Nursing note and vitals reviewed.   ED Course  Procedures (including critical care time) Labs Review Labs Reviewed - No data to display  Imaging Review Dg Lumbar Spine Complete  06/10/2015   CLINICAL DATA:  64 year old female with lumbar back pain radiating to the right lower extremity for several months, now severe with difficulty walking. No known injury. Initial encounter.  EXAM: LUMBAR SPINE - COMPLETE 4+ VIEW  COMPARISON:  None.  FINDINGS: Normal lumbar segmentation. Grade 1 anterolisthesis at L4-L5 associated with moderate to severe facet hypertrophy. No pars fracture. Moderate L5-S1 facet hypertrophy. Mild lumbar disc space loss. Intermittent lumbar endplate spurring, most pronounced at L3. Visible lower thoracic levels appear intact. sacral ala and SI joints within normal limits.  IMPRESSION: 1. Grade 1 anterolisthesis with moderate to severe facet arthropathy at L4-L5. 2.  No acute osseous  abnormality identified in the lumbar spine.   Electronically Signed   By: Genevie Ann M.D.   On: 06/10/2015 12:54     EKG Interpretation None      MDM   Final diagnoses:  Sciatica, right  Low back pain with right-sided sciatica, unspecified back pain laterality    Schedule to see Dr. Aline Brochure for evaluation of back pain.  avs Prednisone taper Hydrocodone     Fransico Meadow,  PA-C 06/10/15 [place], MD 06/10/15 [phone]

## 2015-06-22 ENCOUNTER — Telehealth: Payer: Self-pay | Admitting: Orthopedic Surgery

## 2015-06-22 NOTE — Telephone Encounter (Signed)
Called back to patient; scheduled appointment accordingly. °

## 2015-06-22 NOTE — Telephone Encounter (Signed)
Call received from patient following Emergency Room visit 06/10/15, for problem of sciatica.  Patient had an Xray at that time, and was prescribed medication.  States she is still having flare-ups, and is requesting appointment.  Relayed it would be in August, unless approved by Dr Aline Brochure for a work-in slot this week.  Patient aware of self-pay minimum amount, and has also been given [place] contact information.  Please review and advise regarding scheduling.  Patient ph# [phone]

## 2015-06-22 NOTE — Telephone Encounter (Signed)
August is correct

## 2015-07-14 ENCOUNTER — Ambulatory Visit: Payer: Self-pay | Admitting: Orthopedic Surgery

## 2017-09-25 ENCOUNTER — Encounter: Payer: Self-pay | Admitting: Gastroenterology

## 2017-10-24 ENCOUNTER — Other Ambulatory Visit (HOSPITAL_COMMUNITY): Payer: Self-pay | Admitting: Internal Medicine

## 2017-10-24 DIAGNOSIS — Z1231 Encounter for screening mammogram for malignant neoplasm of breast: Secondary | ICD-10-CM

## 2017-10-24 DIAGNOSIS — Z780 Asymptomatic menopausal state: Secondary | ICD-10-CM

## 2017-10-25 ENCOUNTER — Other Ambulatory Visit (HOSPITAL_COMMUNITY): Payer: Self-pay | Admitting: Internal Medicine

## 2017-10-25 ENCOUNTER — Ambulatory Visit (HOSPITAL_COMMUNITY)
Admission: RE | Admit: 2017-10-25 | Discharge: 2017-10-25 | Disposition: A | Discharge status: Home / Self Care | Payer: BLUE CROSS/BLUE SHIELD | Source: Ambulatory Visit | Attending: Internal Medicine | Admitting: Internal Medicine

## 2017-10-25 DIAGNOSIS — R9389 Abnormal findings on diagnostic imaging of other specified body structures: Secondary | ICD-10-CM | POA: Insufficient documentation

## 2017-10-25 DIAGNOSIS — M545 Low back pain: Secondary | ICD-10-CM | POA: Diagnosis present

## 2017-10-25 DIAGNOSIS — M5136 Other intervertebral disc degeneration, lumbar region: Secondary | ICD-10-CM | POA: Diagnosis not present

## 2017-10-25 DIAGNOSIS — M4316 Spondylolisthesis, lumbar region: Secondary | ICD-10-CM | POA: Diagnosis not present

## 2017-10-25 DIAGNOSIS — R52 Pain, unspecified: Secondary | ICD-10-CM

## 2017-11-02 ENCOUNTER — Encounter: Payer: Self-pay | Admitting: Gastroenterology

## 2017-11-02 ENCOUNTER — Ambulatory Visit: Payer: BLUE CROSS/BLUE SHIELD | Admitting: Gastroenterology

## 2017-11-02 ENCOUNTER — Other Ambulatory Visit: Payer: Self-pay

## 2017-11-02 ENCOUNTER — Telehealth: Payer: Self-pay

## 2017-11-02 DIAGNOSIS — K625 Hemorrhage of anus and rectum: Secondary | ICD-10-CM | POA: Diagnosis not present

## 2017-11-02 DIAGNOSIS — K6289 Other specified diseases of anus and rectum: Secondary | ICD-10-CM

## 2017-11-02 HISTORY — DX: Hemorrhage of anus and rectum: K62.5

## 2017-11-02 MED ORDER — LIDOCAINE-HYDROCORTISONE ACE 3-2.5 % RE KIT: PACK | RECTAL | 0 refills | Status: DC

## 2017-11-02 MED ORDER — PEG 3350-KCL-NA BICARB-NACL 420 G PO SOLR: 4000.0000 mL | ORAL | 0 refills | Status: DC

## 2017-11-02 MED ORDER — NA SULFATE-K SULFATE-MG SULF 17.5-3.13-1.6 GM/177ML PO SOLN: 1.0000 | ORAL | 0 refills | Status: DC

## 2017-11-02 NOTE — Progress Notes (Signed)
Subjective:    Patient ID: Michele Duncan, female    DOB: Jul 25, 1951, 66 y.o.   MRN: [id number]  [name], MD  HPI C/o back pain. BEEN HAVING TROUBLE WITH CONSTIPATION. COTTAGE CHEESE/BANANAS GOT SICK. NEVER COLONOSCOPY. HAS RECTAL BLEEDING WHEN SHE WIPES. RECTAL PAIN EVERY DAY AFTER BM(THROBBING, BURNING, SINCE OCT 2018). ALSO HAS RECTAL PRESSURE, ITCHING, SOILING. MEDS TRIED FOR RELIEF: PREP H. TAKING STOOL SOFTENER. THIS WEEK NO PROBLEMS WITH CONSTIPATION FOR YEARS. DRINK WATER. FIBER: SO-SO. FEELS CONSTIPATED  ONCE A MO. NO WORSE FOR YEARS. THIS WEEK BOWELS MOVING GOOD. NO ASPIRIN, BC/GOODY POWDERS, OR NAPROXEN/ALEVE. NO ETOH OR TOBACCO ABUSE.   PT DENIES FEVER, CHILLS, HEMATEMESIS, nausea, vomiting, melena, diarrhea, CHEST PAIN, SHORTNESS OF BREATH,  CHANGE IN BOWEL IN HABITS, abdominal pain, problems swallowing, OR heartburn or indigestion.  Past Medical History:  Diagnosis Date  . Anxiety   . Back pain   . HTN (hypertension)    PAST SURGICAL HISTORY: NONE  Allergies  Allergen Reactions  . Codeine Itching  . Penicillins Itching   Current Outpatient Medications  Medication Sig Dispense Refill  . Cholecalciferol (VITAMIN D3) 1000 units CAPS Take 7,000 Units by mouth daily.    Marland Kitchen losartan-hydrochlorothiazide (HYZAAR) 50-12.5 MG tablet Take 1 tablet by mouth daily.    . potassium chloride SA (K-DUR,KLOR-CON) 20 MEQ tablet Take 40 mEq by mouth daily.    Orlie Dakin Sodium 8.6-50 MG CAPS Take 1 capsule by mouth daily as needed.     Family History  Problem Relation Age of Onset  . Colon cancer Paternal Grandmother   . Colon polyps Neg Hx    Social History   Socioeconomic History  . Marital status: Married    Spouse name: None  . Number of children: None  . Years of education: None  . Highest education level: None  Social Needs  . Financial resource strain: None  . Food insecurity - worry: None  . Food insecurity - inability: None  . Transportation  needs - medical: None  . Transportation needs - non-medical: None  Occupational History  . None  Tobacco Use  . Smoking status: Former Smoker    Types: Cigarettes    Last attempt to quit: 12/05/2013    Years since quitting: 3.9  . Smokeless tobacco: Never Used  Substance and Sexual Activity  . Alcohol use: No    Frequency: Never  . Drug use: No  . Sexual activity: Yes  Other Topics Concern  . 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    Review of Systems PER HPI OTHERWISE ALL SYSTEMS ARE NEGATIVE.    Objective:   Physical Exam  Constitutional: She is oriented to person, place, and time. She appears well-developed and well-nourished. No distress.  HENT:  Head: Normocephalic and atraumatic.  Mouth/Throat: Oropharynx is clear and moist. No oropharyngeal exudate.  Eyes: Pupils are equal, round, and reactive to light. No scleral icterus.  Neck: Normal range of motion. Neck supple.  Cardiovascular: Normal rate, regular rhythm and normal heart sounds.  Pulmonary/Chest: Effort normal and breath sounds normal. No respiratory distress.  Abdominal: Soft. Bowel sounds are normal. She exhibits no distension. There is no tenderness.  Genitourinary: Rectal exam shows external hemorrhoid and tenderness. Rectal exam shows no mass and anal tone normal.     Genitourinary Comments: HEMORRHOIDS PINK, ENGORGED SLIGHTLY BUT NOT THROMBOSED  Musculoskeletal: She exhibits no edema.  Lymphadenopathy:    She has  no cervical adenopathy.  Neurological: She is alert and oriented to person, place, and time.  NO FOCAL DEFICITS  Psychiatric: She has a normal mood and affect.  Vitals reviewed.     Assessment & Plan:

## 2017-11-02 NOTE — Patient Instructions (Addendum)
DRINK WATER TO KEEP YOUR URINE LIGHT YELLOW.  CONTINUE STOOL SOFTENERS TWO TO THREE TIMES A DAY.   FOLLOW A HIGH FIBER DIET. AVOID ITEMS THAT CAUSE BLOATING & GAS. SEE INFO BELOW.   Complete COLONOSCOPY ON DEC 3 OR 10. IF YOU NEED HEMORRHOID SURGERY WE CAN GET IT DONE BEFORE DEC 31.   USE APOTHECARY CREAM FOUR TIMES  A DAY FOR 2 WEEKS  TO RELIEVE RECTAL PAIN/PRESSURE/BLEEDING.  USE PREPARATION H OR WITCH HAZEL WIPES FOUR TIMES  A DAY IF NEEDED TO RELIEVE RECTAL PAIN/PRESSURE/BLEEDING.  FOLLOW UP IN 6 MOS.    High-Fiber Diet A high-fiber diet changes your normal diet to include more whole grains, legumes, fruits, and vegetables. Changes in the diet involve replacing refined carbohydrates with unrefined foods. The calorie level of the diet is essentially unchanged. The Dietary Reference Intake (recommended amount) for adult males is 38 grams per day. For adult females, it is 25 grams per day. Pregnant and lactating women should consume 28 grams of fiber per day.Fiber is the intact part of a plant that is not broken down during digestion. Functional fiber is fiber that has been isolated from the plant to provide a beneficial effect in the body.  PURPOSE  Increase stool bulk.   Ease and regulate bowel movements.   Lower cholesterol.   REDUCE RISK OF COLON CANCER  INDICATIONS THAT YOU NEED MORE FIBER  Constipation and hemorrhoids.   Uncomplicated diverticulosis (intestine condition) and irritable bowel syndrome.   Weight management.   As a protective measure against hardening of the arteries (atherosclerosis), diabetes, and cancer.   GUIDELINES FOR INCREASING FIBER IN THE DIET  Start adding fiber to the diet slowly. A gradual increase of about 5 more grams (2 slices of whole-wheat bread, 2 servings of most fruits or vegetables, or 1 bowl of high-fiber cereal) per day is best. Too rapid an increase in fiber may result in constipation, flatulence, and bloating.   Drink enough  water and fluids to keep your urine clear or pale yellow. Water, juice, or caffeine-free drinks are recommended. Not drinking enough fluid may cause constipation.   Eat a variety of high-fiber foods rather than one type of fiber.   Try to increase your intake of fiber through using high-fiber foods rather than fiber pills or supplements that contain small amounts of fiber.   The goal is to change the types of food eaten. Do not supplement your present diet with high-fiber foods, but replace foods in your present diet.  INCLUDE A VARIETY OF FIBER SOURCES  Replace refined and processed grains with whole grains, canned fruits with fresh fruits, and incorporate other fiber sources. White rice, white breads, and most bakery goods contain little or no fiber.   Brown whole-grain rice, buckwheat oats, and many fruits and vegetables are all good sources of fiber. These include: broccoli, Brussels sprouts, cabbage, cauliflower, beets, sweet potatoes, white potatoes (skin on), carrots, tomatoes, eggplant, squash, berries, fresh fruits, and dried fruits.   Cereals appear to be the richest source of fiber. Cereal fiber is found in whole grains and bran. Bran is the fiber-rich outer coat of cereal grain, which is largely removed in refining. In whole-grain cereals, the bran remains. In breakfast cereals, the largest amount of fiber is found in those with "bran" in their names. The fiber content is sometimes indicated on the label.   You may need to include additional fruits and vegetables each day.   In baking, for 1 cup white flour,  you may use the following substitutions:   1 cup whole-wheat flour minus 2 tablespoons.   1/2 cup white flour plus 1/2 cup whole-wheat flour.

## 2017-11-02 NOTE — Progress Notes (Signed)
CC'D TO PCP °

## 2017-11-02 NOTE — Assessment & Plan Note (Signed)
ASSOCIATED WITH RECTAL PAIN AND SYMPTOMS NOT CONTROLLED. DIFFERENTIAL DIAGNOSIS INCLUDES HEMORRHOIDS, COLON POLYPS, AVMs, & LESS LIKELY COLON CA.  DRINK WATER TO KEEP YOUR URINE LIGHT YELLOW.  CONTINUE STOOL SOFTENERS TWO TO THREE TIMES A DAY.   FOLLOW A HIGH FIBER DIET. AVOID ITEMS THAT CAUSE BLOATING & GAS. SEE INFO BELOW.   Complete COLONOSCOPY ON DEC 3 OR 10. IF YOU NEED HEMORRHOID SURGERY WE CAN GET IT DONE BEFORE DEC 31. PHENERGAN 12.5 MG IV IN PREOP. DISCUSSED PROCEDURE, BENEFITS, & RISKS: < 1% chance of medication reaction, bleeding, perforation, or rupture of spleen/liver. USE APOTHECARY CREAM FOUR TIMES  A DAY FOR 2 WEEKS  TO RELIEVE RECTAL PAIN/PRESSURE/BLEEDING. USE PREPARATION H OR WITCH HAZEL WIPES FOUR TIMES  A DAY IF NEEDED TO RELIEVE RECTAL PAIN/PRESSURE/BLEEDING.  FOLLOW UP IN 6 MOS.

## 2017-11-02 NOTE — H&P (View-Only) (Signed)
Subjective:    Patient ID: Michele Duncan, female    DOB: [birth year], 66 y.o.   MRN: [id number]  [name], MD  HPI C/o back pain. BEEN HAVING TROUBLE WITH CONSTIPATION. COTTAGE CHEESE/BANANAS GOT SICK. NEVER COLONOSCOPY. HAS RECTAL BLEEDING WHEN SHE WIPES. RECTAL PAIN EVERY DAY AFTER BM(THROBBING, BURNING, SINCE OCT 2018). ALSO HAS RECTAL PRESSURE, ITCHING, SOILING. MEDS TRIED FOR RELIEF: PREP H. TAKING STOOL SOFTENER. THIS WEEK NO PROBLEMS WITH CONSTIPATION FOR YEARS. DRINK WATER. FIBER: SO-SO. FEELS CONSTIPATED  ONCE A MO. NO WORSE FOR YEARS. THIS WEEK BOWELS MOVING GOOD. NO ASPIRIN, BC/GOODY POWDERS, OR NAPROXEN/ALEVE. NO ETOH OR TOBACCO ABUSE.   PT DENIES FEVER, CHILLS, HEMATEMESIS, nausea, vomiting, melena, diarrhea, CHEST PAIN, SHORTNESS OF BREATH,  CHANGE IN BOWEL IN HABITS, abdominal pain, problems swallowing, OR heartburn or indigestion.  Past Medical History:  Diagnosis Date  . Anxiety   . Back pain   . HTN (hypertension)    PAST SURGICAL HISTORY: NONE  Allergies  Allergen Reactions  . Codeine Itching  . Penicillins Itching   Current Outpatient Medications  Medication Sig Dispense Refill  . Cholecalciferol (VITAMIN D3) 1000 units CAPS Take 7,000 Units by mouth daily.    Marland Kitchen losartan-hydrochlorothiazide (HYZAAR) 50-12.5 MG tablet Take 1 tablet by mouth daily.    . potassium chloride SA (K-DUR,KLOR-CON) 20 MEQ tablet Take 40 mEq by mouth daily.    Orlie Dakin Sodium 8.6-50 MG CAPS Take 1 capsule by mouth daily as needed.     Family History  Problem Relation Age of Onset  . Colon cancer Paternal Grandmother   . Colon polyps Neg Hx    Social History   Socioeconomic History  . Marital status: Married    Spouse name: None  . Number of children: None  . Years of education: None  . Highest education level: None  Social Needs  . Financial resource strain: None  . Food insecurity - worry: None  . Food insecurity - inability: None  . Transportation  needs - medical: None  . Transportation needs - non-medical: None  Occupational History  . None  Tobacco Use  . Smoking status: Former Smoker    Types: Cigarettes    Last attempt to quit: 12/05/2013    Years since quitting: 3.9  . Smokeless tobacco: Never Used  Substance and Sexual Activity  . Alcohol use: No    Frequency: Never  . Drug use: No  . Sexual activity: Yes  Other Topics Concern  . 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    Review of Systems PER HPI OTHERWISE ALL SYSTEMS ARE NEGATIVE.    Objective:   Physical Exam  Constitutional: She is oriented to person, place, and time. She appears well-developed and well-nourished. No distress.  HENT:  Head: Normocephalic and atraumatic.  Mouth/Throat: Oropharynx is clear and moist. No oropharyngeal exudate.  Eyes: Pupils are equal, round, and reactive to light. No scleral icterus.  Neck: Normal range of motion. Neck supple.  Cardiovascular: Normal rate, regular rhythm and normal heart sounds.  Pulmonary/Chest: Effort normal and breath sounds normal. No respiratory distress.  Abdominal: Soft. Bowel sounds are normal. She exhibits no distension. There is no tenderness.  Genitourinary: Rectal exam shows external hemorrhoid and tenderness. Rectal exam shows no mass and anal tone normal.     Genitourinary Comments: HEMORRHOIDS PINK, ENGORGED SLIGHTLY BUT NOT THROMBOSED  Musculoskeletal: She exhibits no edema.  Lymphadenopathy:    She has  no cervical adenopathy.  Neurological: She is alert and oriented to person, place, and time.  NO FOCAL DEFICITS  Psychiatric: She has a normal mood and affect.  Vitals reviewed.     Assessment & Plan:

## 2017-11-02 NOTE — Telephone Encounter (Signed)
Received fax from [facility] was rejected. Rx for Tri-Lyte sent to pharmacy. Instructions faxed to pharmacy for her to pick up when she gets med. Called and informed pt. She is ok with having Tri-Lyte. She is also aware to get fleets enema and Dulcolax tablets.

## 2017-11-02 NOTE — Progress Notes (Signed)
ON RECALL  °

## 2017-11-06 ENCOUNTER — Other Ambulatory Visit (HOSPITAL_COMMUNITY): Payer: Self-pay

## 2017-11-06 ENCOUNTER — Encounter (HOSPITAL_COMMUNITY): Payer: Self-pay

## 2017-11-06 ENCOUNTER — Encounter (HOSPITAL_COMMUNITY): Payer: Self-pay | Admitting: *Deleted

## 2017-11-06 ENCOUNTER — Encounter (HOSPITAL_COMMUNITY)
Admission: RE | Disposition: A | Discharge status: Home / Self Care | Payer: Self-pay | Source: Ambulatory Visit | Attending: Gastroenterology

## 2017-11-06 ENCOUNTER — Ambulatory Visit (HOSPITAL_COMMUNITY)
Admission: RE | Admit: 2017-11-06 | Discharge: 2017-11-06 | Disposition: A | Discharge status: Home / Self Care | Payer: BLUE CROSS/BLUE SHIELD | Source: Ambulatory Visit | Attending: Gastroenterology | Admitting: Gastroenterology

## 2017-11-06 ENCOUNTER — Other Ambulatory Visit: Payer: Self-pay

## 2017-11-06 ENCOUNTER — Ambulatory Visit (HOSPITAL_COMMUNITY): Payer: Self-pay

## 2017-11-06 DIAGNOSIS — K6289 Other specified diseases of anus and rectum: Secondary | ICD-10-CM

## 2017-11-06 DIAGNOSIS — K644 Residual hemorrhoidal skin tags: Secondary | ICD-10-CM | POA: Insufficient documentation

## 2017-11-06 DIAGNOSIS — D128 Benign neoplasm of rectum: Secondary | ICD-10-CM | POA: Diagnosis not present

## 2017-11-06 DIAGNOSIS — Z800 Family history of malignant neoplasm of digestive organs: Secondary | ICD-10-CM | POA: Diagnosis not present

## 2017-11-06 DIAGNOSIS — Q438 Other specified congenital malformations of intestine: Secondary | ICD-10-CM | POA: Diagnosis not present

## 2017-11-06 DIAGNOSIS — Z79899 Other long term (current) drug therapy: Secondary | ICD-10-CM | POA: Insufficient documentation

## 2017-11-06 DIAGNOSIS — K921 Melena: Secondary | ICD-10-CM | POA: Diagnosis not present

## 2017-11-06 DIAGNOSIS — D125 Benign neoplasm of sigmoid colon: Secondary | ICD-10-CM | POA: Diagnosis not present

## 2017-11-06 DIAGNOSIS — I10 Essential (primary) hypertension: Secondary | ICD-10-CM | POA: Insufficient documentation

## 2017-11-06 DIAGNOSIS — K625 Hemorrhage of anus and rectum: Secondary | ICD-10-CM

## 2017-11-06 DIAGNOSIS — K5730 Diverticulosis of large intestine without perforation or abscess without bleeding: Secondary | ICD-10-CM | POA: Insufficient documentation

## 2017-11-06 DIAGNOSIS — K648 Other hemorrhoids: Secondary | ICD-10-CM | POA: Diagnosis not present

## 2017-11-06 DIAGNOSIS — Z87891 Personal history of nicotine dependence: Secondary | ICD-10-CM | POA: Diagnosis not present

## 2017-11-06 HISTORY — PX: COLONOSCOPY: SHX5424

## 2017-11-06 SURGERY — COLONOSCOPY
Anesthesia: Moderate Sedation

## 2017-11-06 MED ORDER — EPINEPHRINE PF 1 MG/10ML IJ SOSY
PREFILLED_SYRINGE | INTRAMUSCULAR | Status: DC | PRN
  Administered 2017-11-06: 1 mL

## 2017-11-06 MED ORDER — SPOT INK MARKER SYRINGE KIT
PACK | SUBMUCOSAL | Status: DC | PRN
  Administered 2017-11-06: 1 mL via SUBMUCOSAL

## 2017-11-06 MED ORDER — SPOT INK MARKER SYRINGE KIT
PACK | SUBMUCOSAL | Status: AC
Start: 2017-11-06 — End: 2017-11-07
  Filled 2017-11-06: qty 5

## 2017-11-06 MED ORDER — MEPERIDINE HCL 100 MG/ML IJ SOLN
INTRAMUSCULAR | Status: AC
Start: 2017-11-06 — End: 2017-11-07
  Filled 2017-11-06: qty 2

## 2017-11-06 MED ORDER — MIDAZOLAM HCL 5 MG/5ML IJ SOLN
INTRAMUSCULAR | Status: AC
  Filled 2017-11-06: qty 10

## 2017-11-06 MED ORDER — SODIUM CHLORIDE 0.9% FLUSH
INTRAVENOUS | Status: AC
  Filled 2017-11-06: qty 10

## 2017-11-06 MED ORDER — MEPERIDINE HCL 100 MG/ML IJ SOLN
INTRAMUSCULAR | Status: DC | PRN
Start: 2017-11-06 — End: 2017-11-06
  Administered 2017-11-06: 50 mg via INTRAVENOUS
  Administered 2017-11-06 (×2): 25 mg via INTRAVENOUS

## 2017-11-06 MED ORDER — MIDAZOLAM HCL 5 MG/5ML IJ SOLN
INTRAMUSCULAR | Status: DC | PRN
  Administered 2017-11-06: 2 mg via INTRAVENOUS
  Administered 2017-11-06: 1 mg via INTRAVENOUS
  Administered 2017-11-06: 2 mg via INTRAVENOUS

## 2017-11-06 MED ORDER — LIDOCAINE HCL 2 % EX GEL
CUTANEOUS | Status: AC
Start: 2017-11-06 — End: 2017-11-07
  Filled 2017-11-06: qty 30

## 2017-11-06 MED ORDER — PROMETHAZINE HCL 25 MG/ML IJ SOLN
12.5000 mg | Freq: Once | INTRAMUSCULAR | Status: AC
  Administered 2017-11-06: 12.5 mg via INTRAVENOUS

## 2017-11-06 MED ORDER — EPINEPHRINE PF 1 MG/10ML IJ SOSY
PREFILLED_SYRINGE | INTRAMUSCULAR | Status: AC
  Filled 2017-11-06: qty 10

## 2017-11-06 MED ORDER — PROMETHAZINE HCL 25 MG/ML IJ SOLN
INTRAMUSCULAR | Status: AC
  Filled 2017-11-06: qty 1

## 2017-11-06 MED ORDER — SODIUM CHLORIDE 0.9 % IV SOLN
INTRAVENOUS | Status: DC
  Administered 2017-11-06: 13:00:00 via INTRAVENOUS

## 2017-11-06 MED ORDER — STERILE WATER FOR IRRIGATION IR SOLN
Status: DC | PRN
  Administered 2017-11-06: 100 mL

## 2017-11-06 NOTE — Interval H&P Note (Signed)
History and Physical Interval Note:  11/06/2017 1:29 PM  Michele Duncan  has presented today for surgery, with the diagnosis of rectal bleeding, rectal pain  The various methods of treatment have been discussed with the patient and family. After consideration of risks, benefits and other options for treatment, the patient has consented to  Procedure(s) with comments: COLONOSCOPY (N/A) - 1:45pm as a surgical intervention .  The patient's history has been reviewed, patient examined, no change in status, stable for surgery.  I have reviewed the patient's chart and labs.  Questions were answered to the patient's satisfaction.     [employer]

## 2017-11-06 NOTE — Op Note (Signed)
[facility] Patient Name: Michele Duncan Procedure Date: 11/06/2017 2:06 PM MRN: [id number] Date of Birth: 1950/12/28 Attending MD: Barney Drain MD, MD CSN: [id number] Age: 66 Admit Type: Outpatient Procedure:                Colonoscopy WITH COLD SNARE/SNARE CAUTERY                            POLYPECTOMY & SQ INJECTION Indications:              Hematochezia Providers:                Barney Drain MD, MD, Janeece Riggers, RN, Rosina Lowenstein,                            RN Referring MD:             [name] MD, MD Medicines:                Promethazine 12.5 mg IV, Meperidine 100 mg IV,                            Midazolam 5 mg IV Complications:            No immediate complications. Estimated Blood Loss:     Estimated blood loss was minimal. Procedure:                Pre-Anesthesia Assessment:                           - Prior to the procedure, a History and Physical                            was performed, and patient medications and                            allergies were reviewed. The patient's tolerance of                            previous anesthesia was also reviewed. The risks                            and benefits of the procedure and the sedation                            options and risks were discussed with the patient.                            All questions were answered, and informed consent                            was obtained. Prior Anticoagulants: The patient has                            taken ibuprofen, last dose was 1 day prior to  procedure. ASA Grade Assessment: II - A patient                            with mild systemic disease. After reviewing the                            risks and benefits, the patient was deemed in                            satisfactory condition to undergo the procedure.                            After obtaining informed consent, the colonoscope                            was passed under direct  vision. Throughout the                            procedure, the patient's blood pressure, pulse, and                            oxygen saturations were monitored continuously. The                            EC-[id number] ([id number]) scope was introduced through                            the anus and advanced to the 10 cm into the ileum.                            The colonoscopy was technically difficult and                            complex due to restricted mobility of the colon,                            significant looping and the patient's discomfort                            during the procedure. Successful completion of the                            procedure was aided by increasing the dose of                            sedation medication, straightening and shortening                            the scope to obtain bowel loop reduction and                            COLOWRAP. The patient tolerated the procedure  fairly well. The quality of the bowel preparation                            was excellent. The terminal ileum, ileocecal valve,                            appendiceal orifice, and rectum were photographed. Scope In: 2:35:39 PM Scope Out: 3:10:14 PM Scope Withdrawal Time: 0 hours 27 minutes 49 seconds  Total Procedure Duration: 0 hours 34 minutes 35 seconds  Findings:      The terminal ileum appeared normal.      A 15 mm polyp was found in the sigmoid colon. The polyp was       pedunculated. Area was successfully injected with 1 mL of a 1:10,000       solution of epinephrine for drug delivery. The polyp was removed with a       hot snare. Resection and retrieval were complete. Area was tattooed with       an injection of 1 mL of Spot (carbon black).      Two sessile polyps were found in the rectum and sigmoid colon. The       polyps were 3 to 6 mm in size. These polyps were removed with a cold       snare. Resection and retrieval were complete.       Multiple small and large-mouthed diverticula were found in the       recto-sigmoid colon, sigmoid colon and cecum.      The recto-sigmoid colon, sigmoid colon and descending colon were       moderately redundant.      Non-bleeding internal hemorrhoids were found during retroflexion. The       hemorrhoids were small.      External hemorrhoids were found during retroflexion. The hemorrhoids       were large. Impression:               - The examined portion of the ileum was normal.                           - One 15 mm polyp in the sigmoid colon, removed                            with a hot snare. Resected and retrieved. Injected.                            Tattooed.                           - Two 3 to 6 mm polyps in the rectum and in the                            sigmoid colon, removed with a cold snare. Resected                            and retrieved.                           - MODERATE Diverticulosis in the recto-sigmoid  colon, in the sigmoid colon and in the cecum.                           - EXTREMELY Redundant LEFT colon.                           - RECTAL BLEEDING DUE TO LARGESIGMOID COLON POLYP                            AND INTERNAL HEMORRHOIDS                           - External hemorrhoids. Moderate Sedation:      Moderate (conscious) sedation was administered by the endoscopy nurse       and supervised by the endoscopist. The following parameters were       monitored: oxygen saturation, heart rate, blood pressure, and response       to care. Total physician intraservice time was 51 minutes. Recommendation:           - Repeat colonoscopy 1-3 YEARS for surveillance                            WITH PROPOFOL.                           - High fiber diet.                           - Continue present medications.                           - Await pathology results.                           - Patient has a contact number available for                             emergencies. The signs and symptoms of potential                            delayed complications were discussed with the                            patient. Return to normal activities tomorrow.                            Written discharge instructions were provided to the                            patient. Procedure Code(s):        --- Professional ---                           [phone], Colonoscopy, flexible; with removal of                            tumor(s),  polyp(s), or other lesion(s) by snare                            technique                           45381, Colonoscopy, flexible; with directed                            submucosal injection(s), any substance                           99152, Moderate sedation services provided by the                            same physician or other qualified health care                            professional performing the diagnostic or                            therapeutic service that the sedation supports,                            requiring the presence of an independent trained                            observer to assist in the monitoring of the                            patient's level of consciousness and physiological                            status; initial 15 minutes of intraservice time,                            patient age 76 years or older                           [phone], Moderate sedation services; each additional                            15 minutes intraservice time                           99153, Moderate sedation services; each additional                            15 minutes intraservice time Diagnosis Code(s):        --- Professional ---                           K64.4, Residual hemorrhoidal skin tags                           K64.8, Other hemorrhoids  D12.5, Benign neoplasm of sigmoid colon                           K62.1, Rectal polyp                           K92.1,  Melena (includes Hematochezia)                           K57.30, Diverticulosis of large intestine without                            perforation or abscess without bleeding                           Q43.8, Other specified congenital malformations of                            intestine CPT copyright 2016 [employer]. All rights reserved. The codes documented in this report are preliminary and upon coder review may  be revised to meet current compliance requirements. Barney Drain, MD Barney Drain MD, MD 11/06/2017 3:23:18 PM This report has been signed electronically. Number of Addenda: 0

## 2017-11-06 NOTE — Discharge Instructions (Signed)
YOUR RECTAL BLEEDING IS DUE TO small internal hemorrhoids AND THE LARGE SIGMOID COLON POLYP. You have LARGE EXTERNAL HEMORRHOIDS. YOU HAVE diverticulosis IN YOUR LEFT AND RIGHT COLON. YOU HAD THREE POLYPS REMOVED. ONE WAS LARGER AND I TATTOOED THE BASE.    DRINK WATER TO KEEP YOUR URINE LIGHT YELLOW.  FOLLOW A HIGH FIBER DIET. AVOID ITEMS THAT CAUSE BLOATING. See info below.  YOUR BIOPSY RESULTS WILL BE AVAILABLE IN MY CHART AFTER DEC 7 AND MY OFFICE WILL CONTACT YOU IN 10-14 DAYS WITH YOUR RESULTS.   CONTINUE APOTHECARY CREAM FOR 2 WEEKS. USE PREPARATION H FOUR TIMES  A DAY IF NEEDED TO RELIEVE RECTAL PAIN/PRESSURE/BLEEDING. IF YOU SYMPTOMS CANNOT BE CONTROLLED WITH THE CREAMS, SEE SURGERY TO FIX YOUR HEMORRHOIDS.   Next colonoscopy in 1-3 years.  Colonoscopy Care After Read the instructions outlined below and refer to this sheet in the next week. These discharge instructions provide you with general information on caring for yourself after you leave the hospital. While your treatment has been planned according to the most current medical practices available, unavoidable complications occasionally occur. If you have any problems or questions after discharge, call DR. Hazelee Harbold, [phone].  ACTIVITY  You may resume your regular activity, but move at a slower pace for the next 24 hours.   Take frequent rest periods for the next 24 hours.   Walking will help get rid of the air and reduce the bloated feeling in your belly (abdomen).   No driving for 24 hours (because of the medicine (anesthesia) used during the test).   You may shower.   Do not sign any important legal documents or operate any machinery for 24 hours (because of the anesthesia used during the test).    NUTRITION  Drink plenty of fluids.   You may resume your normal diet as instructed by your doctor.   Begin with a light meal and progress to your normal diet. Heavy or fried foods are harder to digest and may make  you feel sick to your stomach (nauseated).   Avoid alcoholic beverages for 24 hours or as instructed.    MEDICATIONS  You may resume your normal medications.   WHAT YOU CAN EXPECT TODAY  Some feelings of bloating in the abdomen.   Passage of more gas than usual.   Spotting of blood in your stool or on the toilet paper  .  IF YOU HAD POLYPS REMOVED DURING THE COLONOSCOPY:  Eat a soft diet IF YOU HAVE NAUSEA, BLOATING, ABDOMINAL PAIN, OR VOMITING.    FINDING OUT THE RESULTS OF YOUR TEST Not all test results are available during your visit. DR. Oneida Alar WILL CALL YOU WITHIN 14 DAYS OF YOUR PROCEDUE WITH YOUR RESULTS. Do not assume everything is normal if you have not heard from DR. Tanairi Cypert, CALL HER OFFICE AT [phone].  SEEK IMMEDIATE MEDICAL ATTENTION AND CALL THE OFFICE: [phone] IF:  You have more than a spotting of blood in your stool.   Your belly is swollen (abdominal distention).   You are nauseated or vomiting.   You have a temperature over 101F.   You have abdominal pain or discomfort that is severe or gets worse throughout the day.  High-Fiber Diet A high-fiber diet changes your normal diet to include more whole grains, legumes, fruits, and vegetables. Changes in the diet involve replacing refined carbohydrates with unrefined foods. The calorie level of the diet is essentially unchanged. The Dietary Reference Intake (recommended amount) for adult males is 38 grams  per day. For adult females, it is 25 grams per day. Pregnant and lactating women should consume 28 grams of fiber per day. Fiber is the intact part of a plant that is not broken down during digestion. Functional fiber is fiber that has been isolated from the plant to provide a beneficial effect in the body. PURPOSE  Increase stool bulk.   Ease and regulate bowel movements.   Lower cholesterol.   REDUCE RISK OF COLON CANCER  INDICATIONS THAT YOU NEED MORE FIBER  Constipation and hemorrhoids.     Uncomplicated diverticulosis (intestine condition) and irritable bowel syndrome.   Weight management.   As a protective measure against hardening of the arteries (atherosclerosis), diabetes, and cancer.   GUIDELINES FOR INCREASING FIBER IN THE DIET  Start adding fiber to the diet slowly. A gradual increase of about 5 more grams (2 slices of whole-wheat bread, 2 servings of most fruits or vegetables, or 1 bowl of high-fiber cereal) per day is best. Too rapid an increase in fiber may result in constipation, flatulence, and bloating.   Drink enough water and fluids to keep your urine clear or pale yellow. Water, juice, or caffeine-free drinks are recommended. Not drinking enough fluid may cause constipation.   Eat a variety of high-fiber foods rather than one type of fiber.   Try to increase your intake of fiber through using high-fiber foods rather than fiber pills or supplements that contain small amounts of fiber.   The goal is to change the types of food eaten. Do not supplement your present diet with high-fiber foods, but replace foods in your present diet.   INCLUDE A VARIETY OF FIBER SOURCES  Replace refined and processed grains with whole grains, canned fruits with fresh fruits, and incorporate other fiber sources. White rice, white breads, and most bakery goods contain little or no fiber.   Brown whole-grain rice, buckwheat oats, and many fruits and vegetables are all good sources of fiber. These include: broccoli, Brussels sprouts, cabbage, cauliflower, beets, sweet potatoes, white potatoes (skin on), carrots, tomatoes, eggplant, squash, berries, fresh fruits, and dried fruits.   Cereals appear to be the richest source of fiber. Cereal fiber is found in whole grains and bran. Bran is the fiber-rich outer coat of cereal grain, which is largely removed in refining. In whole-grain cereals, the bran remains. In breakfast cereals, the largest amount of fiber is found in those with  "bran" in their names. The fiber content is sometimes indicated on the label.   You may need to include additional fruits and vegetables each day.   In baking, for 1 cup white flour, you may use the following substitutions:   1 cup whole-wheat flour minus 2 tablespoons.   1/2 cup white flour plus 1/2 cup whole-wheat flour.   Polyps, Colon  A polyp is extra tissue that grows inside your body. Colon polyps grow in the large intestine. The large intestine, also called the colon, is part of your digestive system. It is a long, hollow tube at the end of your digestive tract where your body makes and stores stool. Most polyps are not dangerous. They are benign. This means they are not cancerous. But over time, some types of polyps can turn into cancer. Polyps that are smaller than a pea are usually not harmful. But larger polyps could someday become or may already be cancerous. To be safe, doctors remove all polyps and test them.   PREVENTION There is not one sure way to prevent polyps.  You might be able to lower your risk of getting them if you:  Eat more fruits and vegetables and less fatty food.   Do not smoke.   Avoid alcohol.   Exercise every day.   Lose weight if you are overweight.   Eating more calcium and folate can also lower your risk of getting polyps. Some foods that are rich in calcium are milk, cheese, and broccoli. Some foods that are rich in folate are chickpeas, kidney beans, and spinach.    Diverticulosis Diverticulosis is a common condition that develops when small pouches (diverticula) form in the wall of the colon. The risk of diverticulosis increases with age. It happens more often in people who eat a low-fiber diet. Most individuals with diverticulosis have no symptoms. Those individuals with symptoms usually experience belly (abdominal) pain, constipation, or loose stools (diarrhea).  HOME CARE INSTRUCTIONS  Increase the amount of fiber in your diet as directed  by your caregiver or dietician. This may reduce symptoms of diverticulosis.   Drink at least 6 to 8 glasses of water each day to prevent constipation.   Try not to strain when you have a bowel movement.   Avoiding nuts and seeds to prevent complications is NOT NECESSARY.   FOODS HAVING HIGH FIBER CONTENT INCLUDE:  Fruits. Apple, peach, pear, tangerine, raisins, prunes.   Vegetables. Brussels sprouts, asparagus, broccoli, cabbage, carrot, cauliflower, romaine lettuce, spinach, summer squash, tomato, winter squash, zucchini.   Starchy Vegetables. Baked beans, kidney beans, lima beans, split peas, lentils, potatoes (with skin).   Grains. Whole wheat bread, brown rice, bran flake cereal, plain oatmeal, white rice, shredded wheat, bran muffins.   SEEK IMMEDIATE MEDICAL CARE IF:  You develop increasing pain or severe bloating.   You have an oral temperature above 101F.   You develop vomiting or bowel movements that are bloody or black.   Hemorrhoids Hemorrhoids are dilated (enlarged) veins around the rectum. Sometimes clots will form in the veins. This makes them swollen and painful. These are called thrombosed hemorrhoids. Causes of hemorrhoids include:  Constipation.   Straining to have a bowel movement.   HEAVY LIFTING   HOME CARE INSTRUCTIONS  Eat a well balanced diet and drink 6 to 8 glasses of water every day to avoid constipation. You may also use a bulk laxative.   Avoid straining to have bowel movements.   Keep anal area dry and clean.   Do not use a donut shaped pillow or sit on the toilet for long periods. This increases blood pooling and pain.   Move your bowels when your body has the urge; this will require less straining and will decrease pain and pressure.

## 2017-11-09 MED ORDER — LIDOCAINE-HYDROCORTISONE ACE 3-2.5 % RE KIT: PACK | RECTAL | 0 refills | Status: DC

## 2017-11-09 NOTE — Progress Notes (Signed)
PT is aware.

## 2017-11-09 NOTE — Addendum Note (Signed)
Addended by: Danie Binder on: 11/09/2017 10:43 AM   Modules accepted: Orders

## 2017-11-09 NOTE — Progress Notes (Signed)
PT REQUESTS REFILL ON APOTHECARY CREAM. RX SENT.

## 2017-11-09 NOTE — Progress Notes (Signed)
Pt is aware and said she would like to have some more of the Apothecary cream.

## 2017-11-10 ENCOUNTER — Encounter (HOSPITAL_COMMUNITY): Payer: Self-pay | Admitting: Gastroenterology

## 2017-11-16 ENCOUNTER — Encounter (HOSPITAL_COMMUNITY): Payer: Self-pay

## 2017-11-16 ENCOUNTER — Ambulatory Visit (HOSPITAL_COMMUNITY)
Admission: RE | Admit: 2017-11-16 | Discharge: 2017-11-16 | Disposition: A | Discharge status: Home / Self Care | Payer: BLUE CROSS/BLUE SHIELD | Source: Ambulatory Visit | Attending: Internal Medicine | Admitting: Internal Medicine

## 2017-11-16 DIAGNOSIS — Z780 Asymptomatic menopausal state: Secondary | ICD-10-CM | POA: Insufficient documentation

## 2017-11-16 DIAGNOSIS — Z1231 Encounter for screening mammogram for malignant neoplasm of breast: Secondary | ICD-10-CM | POA: Diagnosis not present

## 2017-11-17 ENCOUNTER — Telehealth: Payer: Self-pay | Admitting: Gastroenterology

## 2017-11-17 NOTE — Telephone Encounter (Signed)
[place] PATIENT, SHE IS HAVING AND SWELLING AND WANTS TO KNOW IF THIS CAN BE HER DIVERTICULOSIS

## 2017-11-17 NOTE — Telephone Encounter (Signed)
Pt said she had an episode this Am with extreme abdominal pain for about an hour. The pain was across her mid abdomen and more to the right. She is not having any pain at this time, just wanted to let us know about it. She had nausea with it and a couple of stools, but not diarrhea.  Her abdomen is swollen and she said her temperature is 97.1 and her norm is 96.8. The only thing she had done different this morning was to restart her vitamins. I told her if she has the pain again and it is severe to go immediately to the ED. She wanted to know if we had a physician on call after hours that could see her, and I told her no, she would have to go to the ED. She said she feels fine now, but just wanted to let us know. Forwarding FYI to Walden Field, NP who is the hospital extender today. Also, FYI for Dr. Oneida Alar for next week.

## 2017-11-21 NOTE — Telephone Encounter (Signed)
REVIEWED-NO ADDITIONAL RECOMMENDATIONS. 

## 2017-11-21 NOTE — Telephone Encounter (Signed)
Noted. If she has recurrence let us know

## 2017-11-21 NOTE — Telephone Encounter (Signed)
Pt is aware, has been doing well and will call if needed.

## 2017-12-29 ENCOUNTER — Telehealth: Payer: Self-pay | Admitting: Gastroenterology

## 2017-12-29 DIAGNOSIS — K649 Unspecified hemorrhoids: Secondary | ICD-10-CM

## 2017-12-29 NOTE — Telephone Encounter (Signed)
Please call patient at [phone]. She has questions about how her bill was coded from her procedure back in May 2018

## 2017-12-29 NOTE — Telephone Encounter (Signed)
Patient is wanting a hemorrhoid banding with [facility]. She is asking if she can have this done in the office instead of making OV and then getting scheduled at the hospital. If she needs an OV prior can I use an URG spot instead of her waiting until mid March to be seen? Please advise and I will call her back at [phone]

## 2017-12-29 NOTE — Telephone Encounter (Signed)
Per billing department:  I had this claim reviewed by coders and they advised me the claim is coded correctly.  I then called [employer] and spoke to Jan D Ref# [id number] who did advise if services were preventative then covered at 100% but since the claim is medical then subj to pt ded, coin so therefore the claim did process correctly

## 2017-12-29 NOTE — Telephone Encounter (Signed)
Routing to Dr. Oneida Alar for her approval

## 2017-12-29 NOTE — Telephone Encounter (Signed)
I spoke with the patient and she was told by the representative that we needed to call the provider line on the back of the card in regards to the coding of this procedure.

## 2017-12-29 NOTE — Telephone Encounter (Signed)
I sent the concerns to billing for follow-up.

## 2018-01-01 ENCOUNTER — Telehealth: Payer: Self-pay

## 2018-01-01 ENCOUNTER — Telehealth: Payer: Self-pay | Admitting: Nurse Practitioner

## 2018-01-01 NOTE — Telephone Encounter (Signed)
I made the patient aware.  

## 2018-01-01 NOTE — Telephone Encounter (Signed)
Error

## 2018-01-01 NOTE — Telephone Encounter (Signed)
Opened in error

## 2018-01-01 NOTE — Telephone Encounter (Signed)
Received fax from [facility] for refill of Apothecary Hemorrhoid cream (apply rectally up to four times daily for 2 weeks).

## 2018-01-01 NOTE — Telephone Encounter (Signed)
PLEASE CALL PT. SHE HAD A TCS IN DEC 2018. HER EXTERNAL HEMORRHOIDS WERE LARGE AND HER INTERNAL HEMORRHOIDS ARE SMALL. SHE IS NOT A CANDIDATE FOR ENDOSCOPIC INTERVENTION. SHE NEEDS TO SEE SURGERY TO FIX HER HEMORRHOIDS. WE CAN REFER HER TO SURGERY IN [place], [place], OR [place]

## 2018-01-02 NOTE — Telephone Encounter (Signed)
Routing to Doris to call the patient 

## 2018-01-02 NOTE — Telephone Encounter (Signed)
PT would like to be referred to surgeon in [place].

## 2018-01-02 NOTE — Telephone Encounter (Signed)
Refill request completed, faxed to [facility].

## 2018-01-02 NOTE — Addendum Note (Signed)
Addended by: Inge Rise on: 01/02/2018 08:52 AM   Modules accepted: Orders

## 2018-01-02 NOTE — Telephone Encounter (Signed)
OK to refill Apothecary Hemorrhoid cream (apply actually up to 4 times daily for 2 weeks).  30 grams. No refills.

## 2018-01-02 NOTE — Telephone Encounter (Signed)
Referral faxed to central [facility] surgery

## 2018-01-08 ENCOUNTER — Telehealth: Payer: Self-pay | Admitting: Gastroenterology

## 2018-01-08 NOTE — Telephone Encounter (Signed)
[facility] Surgery and no appointment has been scheduled. Called made pt aware. Nothing further needed

## 2018-01-08 NOTE — Telephone Encounter (Signed)
Pt called asking for DS to follow up on being referred. I told her that MS had sent a referral to [facility] on 1/29 and they would be contacting her. Patient then started telling me her pain isn't getting any better and she is taking her husband's pain pills and wants to know when she can get scheduled. I told her that MS can call her back. [phone]

## 2018-02-16 ENCOUNTER — Ambulatory Visit: Payer: Self-pay | Admitting: Surgery

## 2018-02-16 NOTE — H&P (View-Only) (Signed)
Michele Duncan Documented: 01/22/2018 2:55 PM Location: [facility] Patient #: [id number] DOB: [birth year] Married / Language: Cleophus Molt / Race: White Female   History of Present Illness Adin Hector MD; 01/22/2018 4:17 PM) The patient is a 67 year old female who presents with hemorrhoids. Note for "Hemorrhoids": ` ` `  Patient sent for surgical consultation at the request of Dr. Barney Drain  Chief Complaint: Hemorrhoids  The patient is a woman struggling with rectal bleeding and some perianal discomfort. Moderate constipation as well. Underwent screening colonoscopy. Some polyps removed. To this adenomas. Still having complaints of external hemorrhoids. Surgical consultation requested.Patient has sharp pain with bowel movements. Very severe. She has had this for almost 6 months. Went to see gastroenterology. Had some rectal bleeding. Underwent colonoscopy. Some adenoma polyp was removed. Found to have some small internal and moderate external hemorrhoids. She wished to have the hemorrhoids removed and was disappointed it did not happen endoscopically. Gastroenterology explained cannot do external removals with endoscopy. Surgical consultation requested.  Patient has chronic constipation. Started on a stool softener. Sometimes moves bowels every days. Sometimes it takes several days to get them going. No prior anorectal intervention. She had vaginal deliveries without episiotomies. She never had any prior surgery. She normally can walk about 15 minutes without difficulty. The anal pain is limited her activity level. No cardiopulmonary issues. She does not smoke. No personal nor family history of GI/colon cancer, inflammatory bowel disease, irritable bowel syndrome, allergy such as Celiac Sprue, dietary/dairy problems, colitis, ulcers nor gastritis. No recent sick contacts/gastroenteritis. No travel outside the country. No changes in diet. No dysphagia to  solids or liquids. No significant heartburn or reflux. No hematochezia, hematemesis, coffee ground emesis. No evidence of prior gastric/peptic ulceration. .   (Review of systems as stated in this history (HPI) or in the review of systems. Otherwise all other 12 point ROS are negative)   Past Surgical History Dalbert Mayotte, CMA; 01/22/2018 2:55 PM) Colon Polyp Removal - Colonoscopy  Hemorrhoidectomy  Knee Surgery  Right. Laparoscopic Inguinal Hernia Surgery  Right. Oral Surgery  Tonsillectomy  Ventral / Umbilical Hernia Surgery  Right.  Diagnostic Studies History Dalbert Mayotte, [place]; 01/22/2018 2:55 PM) Colonoscopy  1-5 years ago Mammogram  within last year Pap Smear  >5 years ago  Allergies Dalbert Mayotte, CMA; 01/22/2018 2:57 PM) Penicillins  Codeine/Codeine Derivatives   Medication History Dalbert Mayotte, CMA; 01/22/2018 3:00 PM) Losartan Potassium-HCTZ (50-12.5MG  Tablet, Oral) Active. AmLODIPine Besylate (5MG  Tablet, Oral) Active. Lidocaine-Hydrocortisone Ace (3-0.5% Cream, Rectal) Active. Vitamin D (Oral) Specific strength unknown - Active. Potassium Chloride (1.5MEQ/ML Solution, Intravenous) Active. Sennosides-Docusate Sodium (10-65MG  Tablet, Oral) Active. Medications Reconciled  Social History Dalbert Mayotte, [place]; 01/22/2018 2:55 PM) Alcohol use  Remotely quit alcohol use. Caffeine use  Tea. No drug use  Tobacco use  Former smoker.  Family History Dalbert Mayotte, [place]; 01/22/2018 2:55 PM) Arthritis  Mother. Breast Cancer  Family Members In General, Mother. Cerebrovascular Accident  Father. Colon Cancer  Family Members In General. Depression  Father. Heart Disease  Father. Hypertension  Father. Prostate Cancer  Father.  Pregnancy / Birth History Dalbert Mayotte, [place]; 01/22/2018 2:55 PM) Age at menarche  30 years. Age of menopause  11-50 Grav[place]  2 Length (months) of breastfeeding  7-12 Maternal age  25-30 Para   2  Other Problems Dalbert Mayotte, [place]; 01/22/2018 2:55 PM) Arthritis  Back Pain  Hemorrhoids  High blood pressure  Hypercholesterolemia  Inguinal Hernia  Umbilical Hernia Repair  Review of Systems Dalbert Mayotte CMA; 01/22/2018 2:56 PM) General Not Present- Appetite Loss, Chills, Fatigue, Fever, Night Sweats, Weight Gain and Weight Loss. Skin Not Present- Change in Wart/Mole, Dryness, Hives, Jaundice, New Lesions, Non-Healing Wounds, Rash and Ulcer. HEENT Not Present- Earache, Hearing Loss, Hoarseness, Nose Bleed, Oral Ulcers, Ringing in the Ears, Seasonal Allergies, Sinus Pain, Sore Throat, Visual Disturbances, Wears glasses/contact lenses and Yellow Eyes. Respiratory Not Present- Bloody sputum, Chronic Cough, Difficulty Breathing, Snoring and Wheezing. Breast Not Present- Breast Mass, Breast Pain, Nipple Discharge and Skin Changes. Cardiovascular Not Present- Chest Pain, Difficulty Breathing Lying Down, Leg Cramps, Palpitations, Rapid Heart Rate, Shortness of Breath and Swelling of Extremities. Gastrointestinal Present- Hemorrhoids. Not Present- Abdominal Pain, Bloating, Bloody Stool, Change in Bowel Habits, Chronic diarrhea, Constipation, Difficulty Swallowing, Excessive gas, Gets full quickly at meals, Indigestion, Nausea, Rectal Pain and Vomiting. Female Genitourinary Not Present- Frequency, Nocturia, Painful Urination, Pelvic Pain and Urgency. Musculoskeletal Not Present- Back Pain, Joint Pain, Joint Stiffness, Muscle Pain, Muscle Weakness and Swelling of Extremities. Neurological Not Present- Decreased Memory, Fainting, Headaches, Numbness, Seizures, Tingling, Tremor, Trouble walking and Weakness. Psychiatric Not Present- Anxiety, Bipolar, Change in Sleep Pattern, Depression, Fearful and Frequent crying. Endocrine Not Present- Cold Intolerance, Excessive Hunger, Hair Changes, Heat Intolerance, Hot flashes and New Diabetes. Hematology Not Present- Blood Thinners, Easy  Bruising, Excessive bleeding, Gland problems, HIV and Persistent Infections.  Vitals Dalbert Mayotte CMA; 01/22/2018 3:00 PM) 01/22/2018 3:00 PM Weight: 168.2 lb Height: 61in Body Surface Area: 1.76 m Body Mass Index: 31.78 kg/m  Temp.: 96.76F  Pulse: 97 (Regular)  BP: 140/90 (Sitting, Left Arm, Standard)       Physical Exam Adin Hector MD; 01/22/2018 3:12 PM) General Mental Status-Alert. General Appearance-Not in acute distress, Not Sickly. Orientation-Oriented X3. Hydration-Well hydrated. Voice-Normal.  Integumentary Global Assessment Upon inspection and palpation of skin surfaces of the - Axillae: non-tender, no inflammation or ulceration, no drainage. and Distribution of scalp and body hair is normal. General Characteristics Temperature - normal warmth is noted.  Head and Neck Head-normocephalic, atraumatic with no lesions or palpable masses. Face Global Assessment - atraumatic, no absence of expression. Neck Global Assessment - no abnormal movements, no bruit auscultated on the right, no bruit auscultated on the left, no decreased range of motion, non-tender. Trachea-midline. Thyroid Gland Characteristics - non-tender.  Eye Eyeball - Left-Extraocular movements intact, No Nystagmus. Eyeball - Right-Extraocular movements intact, No Nystagmus. Cornea - Left-No Hazy. Cornea - Right-No Hazy. Sclera/Conjunctiva - Left-No scleral icterus, No Discharge. Sclera/Conjunctiva - Right-No scleral icterus, No Discharge. Pupil - Left-Direct reaction to light normal. Pupil - Right-Direct reaction to light normal.  ENMT Ears Pinna - Left - no drainage observed, no generalized tenderness observed. Right - no drainage observed, no generalized tenderness observed. Nose and Sinuses External Inspection of the Nose - no destructive lesion observed. Inspection of the nares - Left - quiet respiration. Right - quiet respiration. Mouth and  Throat Lips - Upper Lip - no fissures observed, no pallor noted. Lower Lip - no fissures observed, no pallor noted. Nasopharynx - no discharge present. Oral Cavity/Oropharynx - Tongue - no dryness observed. Oral Mucosa - no cyanosis observed. Hypopharynx - no evidence of airway distress observed.  Chest and Lung Exam Inspection Movements - Normal and Symmetrical. Accessory muscles - No use of accessory muscles in breathing. Palpation Palpation of the chest reveals - Non-tender. Auscultation Breath sounds - Normal and Clear.  Cardiovascular Auscultation Rhythm - Regular. Murmurs & Other Heart Sounds - Auscultation of  the heart reveals - No Murmurs and No Systolic Clicks.  Abdomen Inspection Inspection of the abdomen reveals - No Visible peristalsis and No Abnormal pulsations. Umbilicus - No Bleeding, No Urine drainage. Palpation/Percussion Palpation and Percussion of the abdomen reveal - Soft, Non Tender, No Rebound tenderness, No Rigidity (guarding) and No Cutaneous hyperesthesia. Note: Abdomen soft. Not severely distended. No distasis recti. No umbilical or other anterior abdominal wall hernias   Female Genitourinary Sexual Maturity Tanner 5 - Adult hair pattern. Note: No vaginal bleeding nor discharge   Rectal Note: ` ` ` External hemorrhoids x3 rather small but increased sphincter tone and obvious posterior midline fissure. Very sensitive. Barely tolerates finger.  Held off on anoscopic exam. No abscess. No pilonidal disease. No condyloma.   Peripheral Vascular Upper Extremity Inspection - Left - No Cyanotic nailbeds, Not Ischemic. Right - No Cyanotic nailbeds, Not Ischemic.  Neurologic Neurologic evaluation reveals -normal attention span and ability to concentrate, able to name objects and repeat phrases. Appropriate fund of knowledge , normal sensation and normal coordination. Mental Status Affect - not angry, not paranoid. Cranial Nerves-Normal  Bilaterally. Gait-Normal.  Neuropsychiatric Mental status exam performed with findings of-able to articulate well with normal speech/language, rate, volume and coherence, thought content normal with ability to perform basic computations and apply abstract reasoning and no evidence of hallucinations, delusions, obsessions or homicidal/suicidal ideation.  Musculoskeletal Global Assessment Spine, Ribs and Pelvis - no instability, subluxation or laxity. Right Upper Extremity - no instability, subluxation or laxity.  Lymphatic Head & Neck  General Head & Neck Lymphatics: Bilateral - Description - No Localized lymphadenopathy. Axillary  General Axillary Region: Bilateral - Description - No Localized lymphadenopathy. Femoral & Inguinal  Generalized Femoral & Inguinal Lymphatics: Left - Description - No Localized lymphadenopathy. Right - Description - No Localized lymphadenopathy.    Assessment & Plan Adin Hector MD; 01/22/2018 3:28 PM) ANAL FISSURE (K60.2) Impression: Sharp dyschezia and rectal pain classic for anal fissure confirmed on exam. I think it is the main problem that she is having at this time.  I recommended topical diltiazem therapy in the hopes to allow the fissure to heal. If she gets her bowels under better control the fissure heals, do not have to do anything about the hemorrhoids necessarily. If no resolution in symptoms or persistent hemorrhoidal complaints, plan examination under anesthesia with probable partial internal sphincterotomy or hemorrhoidectomy. Current Plans The patient was instructed to call back in 2 weeks with progress Started DILTIAZEM GEL, 2% (External Gel), 1 (one) application four times daily, 15 Gram, 01/22/2018, Ref. x3. Local Order: Apply on anus for 3-6 weeks to allow fissure to heal Pt Education - [facility] Anal Fissure (Lois Ostrom) PROLAPSED INTERNAL HEMORRHOIDS, GRADE 3 (K64.2) Impression: At least external hemorrhoids. Strongly suspicious for  partially prolapse. Not particularly large at this time. Recommend she switch from stool softener to fiber supplement as there is side benefits to fiber. I would help her avoid the extremes of constipation and diarrhea and make these worse.  If fissure is healed and she still struggling with external hemorrhoids, will require surgical treatment and/or removal. Try and treat the fissure first to simplify things if possible. Current Plans Pt Education - [facility] Hemorrhoids (Jaely Silman): discussed with patient and provided information. Pt Education - Pamphlet Given - The Hemorrhoid Book: discussed with patient and provided information. PROLAPSED INTERNAL HEMORRHOIDS, GRADE 2 (K64.1) EXTERNAL HEMORRHOIDS WITH COMPLICATION (E99.3) Impression: External hemorrhoids x3. They are small and not particularly inflamed. On the way to get  rid of this is with surgery. However I would treat the fissure first and see how she does. If all her pain and symptoms resolve when she stays on a high-fiber diet, hopefully no surgery needed. CHRONIC CONSTIPATION (K59.09) Current Plans Pt Education - [facility] Constipation (AT) Pt Education - [facility] Good Bowel Health (Marcy Sookdeo)   Addendum: No improvement with diltiazem.  Plan examination under anesthesia with partial internal sphincterotomy and probable hemorrhoidal ligation/removal external hemorrhoid tags as well  Adin Hector, M.D., F.A.C.S. Gastrointestinal and Minimally Invasive Surgery [facility] [address] [place], [place] [address] [phone] Main / Paging

## 2018-02-16 NOTE — H&P (Signed)
Michele Duncan Documented: 01/22/2018 2:55 PM Location: [facility] Patient #: [id number] DOB: 1951/10/05 Married / Language: Cleophus Molt / Race: White Female   History of Present Illness Michele Hector MD; 01/22/2018 4:17 PM) The patient is a 67 year old female who presents with hemorrhoids. Note for "Hemorrhoids": ` ` `  Patient sent for surgical consultation at the request of Dr. Barney Drain  Chief Complaint: Hemorrhoids  The patient is a woman struggling with rectal bleeding and some perianal discomfort. Moderate constipation as well. Underwent screening colonoscopy. Some polyps removed. To this adenomas. Still having complaints of external hemorrhoids. Surgical consultation requested.Patient has sharp pain with bowel movements. Very severe. She has had this for almost 6 months. Went to see gastroenterology. Had some rectal bleeding. Underwent colonoscopy. Some adenoma polyp was removed. Found to have some small internal and moderate external hemorrhoids. She wished to have the hemorrhoids removed and was disappointed it did not happen endoscopically. Gastroenterology explained cannot do external removals with endoscopy. Surgical consultation requested.  Patient has chronic constipation. Started on a stool softener. Sometimes moves bowels every days. Sometimes it takes several days to get them going. No prior anorectal intervention. She had vaginal deliveries without episiotomies. She never had any prior surgery. She normally can walk about 15 minutes without difficulty. The anal pain is limited her activity level. No cardiopulmonary issues. She does not smoke. No personal nor family history of GI/colon cancer, inflammatory bowel disease, irritable bowel syndrome, allergy such as Celiac Sprue, dietary/dairy problems, colitis, ulcers nor gastritis. No recent sick contacts/gastroenteritis. No travel outside the country. No changes in diet. No dysphagia to  solids or liquids. No significant heartburn or reflux. No hematochezia, hematemesis, coffee ground emesis. No evidence of prior gastric/peptic ulceration. .   (Review of systems as stated in this history (HPI) or in the review of systems. Otherwise all other 12 point ROS are negative)   Past Surgical History Dalbert Mayotte, CMA; 01/22/2018 2:55 PM) Colon Polyp Removal - Colonoscopy  Hemorrhoidectomy  Knee Surgery  Right. Laparoscopic Inguinal Hernia Surgery  Right. Oral Surgery  Tonsillectomy  Ventral / Umbilical Hernia Surgery  Right.  Diagnostic Studies History Dalbert Mayotte, [place]; 01/22/2018 2:55 PM) Colonoscopy  1-5 years ago Mammogram  within last year Pap Smear  >5 years ago  Allergies Dalbert Mayotte, CMA; 01/22/2018 2:57 PM) Penicillins  Codeine/Codeine Derivatives   Medication History Dalbert Mayotte, CMA; 01/22/2018 3:00 PM) Losartan Potassium-HCTZ (50-12.5MG  Tablet, Oral) Active. AmLODIPine Besylate (5MG  Tablet, Oral) Active. Lidocaine-Hydrocortisone Ace (3-0.5% Cream, Rectal) Active. Vitamin D (Oral) Specific strength unknown - Active. Potassium Chloride (1.5MEQ/ML Solution, Intravenous) Active. Sennosides-Docusate Sodium (10-65MG  Tablet, Oral) Active. Medications Reconciled  Social History Dalbert Mayotte, [place]; 01/22/2018 2:55 PM) Alcohol use  Remotely quit alcohol use. Caffeine use  Tea. No drug use  Tobacco use  Former smoker.  Family History Dalbert Mayotte, [place]; 01/22/2018 2:55 PM) Arthritis  Mother. Breast Cancer  Family Members In General, Mother. Cerebrovascular Accident  Father. Colon Cancer  Family Members In General. Depression  Father. Heart Disease  Father. Hypertension  Father. Prostate Cancer  Father.  Pregnancy / Birth History Dalbert Mayotte, [place]; 01/22/2018 2:55 PM) Age at menarche  68 years. Age of menopause  90-50 Gravida  2 Length (months) of breastfeeding  7-12 Maternal age  65-30 Para   2  Other Problems Dalbert Mayotte, [place]; 01/22/2018 2:55 PM) Arthritis  Back Pain  Hemorrhoids  High blood pressure  Hypercholesterolemia  Inguinal Hernia  Umbilical Hernia Repair  Review of Systems Dalbert Mayotte CMA; 01/22/2018 2:56 PM) General Not Present- Appetite Loss, Chills, Fatigue, Fever, Night Sweats, Weight Gain and Weight Loss. Skin Not Present- Change in Wart/Mole, Dryness, Hives, Jaundice, New Lesions, Non-Healing Wounds, Rash and Ulcer. HEENT Not Present- Earache, Hearing Loss, Hoarseness, Nose Bleed, Oral Ulcers, Ringing in the Ears, Seasonal Allergies, Sinus Pain, Sore Throat, Visual Disturbances, Wears glasses/contact lenses and Yellow Eyes. Respiratory Not Present- Bloody sputum, Chronic Cough, Difficulty Breathing, Snoring and Wheezing. Breast Not Present- Breast Mass, Breast Pain, Nipple Discharge and Skin Changes. Cardiovascular Not Present- Chest Pain, Difficulty Breathing Lying Down, Leg Cramps, Palpitations, Rapid Heart Rate, Shortness of Breath and Swelling of Extremities. Gastrointestinal Present- Hemorrhoids. Not Present- Abdominal Pain, Bloating, Bloody Stool, Change in Bowel Habits, Chronic diarrhea, Constipation, Difficulty Swallowing, Excessive gas, Gets full quickly at meals, Indigestion, Nausea, Rectal Pain and Vomiting. Female Genitourinary Not Present- Frequency, Nocturia, Painful Urination, Pelvic Pain and Urgency. Musculoskeletal Not Present- Back Pain, Joint Pain, Joint Stiffness, Muscle Pain, Muscle Weakness and Swelling of Extremities. Neurological Not Present- Decreased Memory, Fainting, Headaches, Numbness, Seizures, Tingling, Tremor, Trouble walking and Weakness. Psychiatric Not Present- Anxiety, Bipolar, Change in Sleep Pattern, Depression, Fearful and Frequent crying. Endocrine Not Present- Cold Intolerance, Excessive Hunger, Hair Changes, Heat Intolerance, Hot flashes and New Diabetes. Hematology Not Present- Blood Thinners, Easy  Bruising, Excessive bleeding, Gland problems, HIV and Persistent Infections.  Vitals Dalbert Mayotte CMA; 01/22/2018 3:00 PM) 01/22/2018 3:00 PM Weight: 168.2 lb Height: 61in Body Surface Area: 1.76 m Body Mass Index: 31.78 kg/m  Temp.: 96.35F  Pulse: 97 (Regular)  BP: 140/90 (Sitting, Left Arm, Standard)       Physical Exam Michele Hector MD; 01/22/2018 3:12 PM) General Mental Status-Alert. General Appearance-Not in acute distress, Not Sickly. Orientation-Oriented X3. Hydration-Well hydrated. Voice-Normal.  Integumentary Global Assessment Upon inspection and palpation of skin surfaces of the - Axillae: non-tender, no inflammation or ulceration, no drainage. and Distribution of scalp and body hair is normal. General Characteristics Temperature - normal warmth is noted.  Head and Neck Head-normocephalic, atraumatic with no lesions or palpable masses. Face Global Assessment - atraumatic, no absence of expression. Neck Global Assessment - no abnormal movements, no bruit auscultated on the right, no bruit auscultated on the left, no decreased range of motion, non-tender. Trachea-midline. Thyroid Gland Characteristics - non-tender.  Eye Eyeball - Left-Extraocular movements intact, No Nystagmus. Eyeball - Right-Extraocular movements intact, No Nystagmus. Cornea - Left-No Hazy. Cornea - Right-No Hazy. Sclera/Conjunctiva - Left-No scleral icterus, No Discharge. Sclera/Conjunctiva - Right-No scleral icterus, No Discharge. Pupil - Left-Direct reaction to light normal. Pupil - Right-Direct reaction to light normal.  ENMT Ears Pinna - Left - no drainage observed, no generalized tenderness observed. Right - no drainage observed, no generalized tenderness observed. Nose and Sinuses External Inspection of the Nose - no destructive lesion observed. Inspection of the nares - Left - quiet respiration. Right - quiet respiration. Mouth and  Throat Lips - Upper Lip - no fissures observed, no pallor noted. Lower Lip - no fissures observed, no pallor noted. Nasopharynx - no discharge present. Oral Cavity/Oropharynx - Tongue - no dryness observed. Oral Mucosa - no cyanosis observed. Hypopharynx - no evidence of airway distress observed.  Chest and Lung Exam Inspection Movements - Normal and Symmetrical. Accessory muscles - No use of accessory muscles in breathing. Palpation Palpation of the chest reveals - Non-tender. Auscultation Breath sounds - Normal and Clear.  Cardiovascular Auscultation Rhythm - Regular. Murmurs & Other Heart Sounds - Auscultation of  the heart reveals - No Murmurs and No Systolic Clicks.  Abdomen Inspection Inspection of the abdomen reveals - No Visible peristalsis and No Abnormal pulsations. Umbilicus - No Bleeding, No Urine drainage. Palpation/Percussion Palpation and Percussion of the abdomen reveal - Soft, Non Tender, No Rebound tenderness, No Rigidity (guarding) and No Cutaneous hyperesthesia. Note: Abdomen soft. Not severely distended. No distasis recti. No umbilical or other anterior abdominal wall hernias   Female Genitourinary Sexual Maturity Tanner 5 - Adult hair pattern. Note: No vaginal bleeding nor discharge   Rectal Note: ` ` ` External hemorrhoids x3 rather small but increased sphincter tone and obvious posterior midline fissure. Very sensitive. Barely tolerates finger.  Held off on anoscopic exam. No abscess. No pilonidal disease. No condyloma.   Peripheral Vascular Upper Extremity Inspection - Left - No Cyanotic nailbeds, Not Ischemic. Right - No Cyanotic nailbeds, Not Ischemic.  Neurologic Neurologic evaluation reveals -normal attention span and ability to concentrate, able to name objects and repeat phrases. Appropriate fund of knowledge , normal sensation and normal coordination. Mental Status Affect - not angry, not paranoid. Cranial Nerves-Normal  Bilaterally. Gait-Normal.  Neuropsychiatric Mental status exam performed with findings of-able to articulate well with normal speech/language, rate, volume and coherence, thought content normal with ability to perform basic computations and apply abstract reasoning and no evidence of hallucinations, delusions, obsessions or homicidal/suicidal ideation.  Musculoskeletal Global Assessment Spine, Ribs and Pelvis - no instability, subluxation or laxity. Right Upper Extremity - no instability, subluxation or laxity.  Lymphatic Head & Neck  General Head & Neck Lymphatics: Bilateral - Description - No Localized lymphadenopathy. Axillary  General Axillary Region: Bilateral - Description - No Localized lymphadenopathy. Femoral & Inguinal  Generalized Femoral & Inguinal Lymphatics: Left - Description - No Localized lymphadenopathy. Right - Description - No Localized lymphadenopathy.    Assessment & Plan Michele Hector MD; 01/22/2018 3:28 PM) ANAL FISSURE (K60.2) Impression: Sharp dyschezia and rectal pain classic for anal fissure confirmed on exam. I think it is the main problem that she is having at this time.  I recommended topical diltiazem therapy in the hopes to allow the fissure to heal. If she gets her bowels under better control the fissure heals, do not have to do anything about the hemorrhoids necessarily. If no resolution in symptoms or persistent hemorrhoidal complaints, plan examination under anesthesia with probable partial internal sphincterotomy or hemorrhoidectomy. Current Plans The patient was instructed to call back in 2 weeks with progress Started DILTIAZEM GEL, 2% (External Gel), 1 (one) application four times daily, 15 Gram, 01/22/2018, Ref. x3. Local Order: Apply on anus for 3-6 weeks to allow fissure to heal Pt Education - [facility] Anal Fissure (Alisah Grandberry) PROLAPSED INTERNAL HEMORRHOIDS, GRADE 3 (K64.2) Impression: At least external hemorrhoids. Strongly suspicious for  partially prolapse. Not particularly large at this time. Recommend she switch from stool softener to fiber supplement as there is side benefits to fiber. I would help her avoid the extremes of constipation and diarrhea and make these worse.  If fissure is healed and she still struggling with external hemorrhoids, will require surgical treatment and/or removal. Try and treat the fissure first to simplify things if possible. Current Plans Pt Education - CCS Hemorrhoids (Lanyia Jewel): discussed with patient and provided information. Pt Education - Pamphlet Given - The Hemorrhoid Book: discussed with patient and provided information. PROLAPSED INTERNAL HEMORRHOIDS, GRADE 2 (K64.1) EXTERNAL HEMORRHOIDS WITH COMPLICATION (W96.0) Impression: External hemorrhoids x3. They are small and not particularly inflamed. On the way to get  rid of this is with surgery. However I would treat the fissure first and see how she does. If all her pain and symptoms resolve when she stays on a high-fiber diet, hopefully no surgery needed. CHRONIC CONSTIPATION (K59.09) Current Plans Pt Education - CCS Constipation (AT) Pt Education - CCS Good Bowel Health (Mliss Wedin)   Addendum: No improvement with diltiazem.  Plan examination under anesthesia with partial internal sphincterotomy and probable hemorrhoidal ligation/removal external hemorrhoid tags as well  Michele Duncan, M.D., F.A.C.S. Gastrointestinal and Minimally Invasive Surgery [facility] [address], [place], [place] [address] [phone] Main / Paging

## 2018-02-20 ENCOUNTER — Encounter (HOSPITAL_BASED_OUTPATIENT_CLINIC_OR_DEPARTMENT_OTHER): Payer: Self-pay | Admitting: *Deleted

## 2018-02-20 ENCOUNTER — Other Ambulatory Visit: Payer: Self-pay

## 2018-02-20 NOTE — Progress Notes (Addendum)
SPOKE W/ PT VIA PHONE FOR PRE-OP INTERVIEW.  NPO AFTER MN.  ARRIVE AT 0630.  NEEDS ISTAT 8 AND EKG.  MAY TAKE TYLENOL IF NEEDED AM DOS W/ SIPS OF WATER.  REVIEWED RECTAL PREP ORDERS W/ PT.  PT VERBALIZED UNDERSTANDING RECTAL PREP AND IF SHE HAS ANY QUESTIONS ABOUT PREP SHE IS TO CALL DR GROSS OFFICE AS PER ORDER.

## 2018-02-27 ENCOUNTER — Encounter: Payer: Self-pay | Admitting: Gastroenterology

## 2018-02-28 ENCOUNTER — Encounter (HOSPITAL_BASED_OUTPATIENT_CLINIC_OR_DEPARTMENT_OTHER): Payer: Self-pay

## 2018-02-28 ENCOUNTER — Ambulatory Visit (HOSPITAL_BASED_OUTPATIENT_CLINIC_OR_DEPARTMENT_OTHER): Payer: BLUE CROSS/BLUE SHIELD | Admitting: Certified Registered"

## 2018-02-28 ENCOUNTER — Encounter (HOSPITAL_BASED_OUTPATIENT_CLINIC_OR_DEPARTMENT_OTHER)
Admission: RE | Disposition: A | Discharge status: Home / Self Care | Payer: Self-pay | Source: Ambulatory Visit | Attending: Surgery

## 2018-02-28 ENCOUNTER — Ambulatory Visit (HOSPITAL_BASED_OUTPATIENT_CLINIC_OR_DEPARTMENT_OTHER)
Admission: RE | Admit: 2018-02-28 | Discharge: 2018-02-28 | Disposition: A | Discharge status: Home / Self Care | Payer: BLUE CROSS/BLUE SHIELD | Source: Ambulatory Visit | Attending: Surgery | Admitting: Surgery

## 2018-02-28 ENCOUNTER — Other Ambulatory Visit: Payer: Self-pay

## 2018-02-28 DIAGNOSIS — E7800 Pure hypercholesterolemia, unspecified: Secondary | ICD-10-CM | POA: Diagnosis not present

## 2018-02-28 DIAGNOSIS — Z87891 Personal history of nicotine dependence: Secondary | ICD-10-CM | POA: Diagnosis not present

## 2018-02-28 DIAGNOSIS — I10 Essential (primary) hypertension: Secondary | ICD-10-CM | POA: Insufficient documentation

## 2018-02-28 DIAGNOSIS — Z800 Family history of malignant neoplasm of digestive organs: Secondary | ICD-10-CM | POA: Insufficient documentation

## 2018-02-28 DIAGNOSIS — Z79899 Other long term (current) drug therapy: Secondary | ICD-10-CM | POA: Insufficient documentation

## 2018-02-28 DIAGNOSIS — K642 Third degree hemorrhoids: Secondary | ICD-10-CM | POA: Insufficient documentation

## 2018-02-28 DIAGNOSIS — K601 Chronic anal fissure: Secondary | ICD-10-CM | POA: Diagnosis present

## 2018-02-28 DIAGNOSIS — K644 Residual hemorrhoidal skin tags: Secondary | ICD-10-CM | POA: Diagnosis not present

## 2018-02-28 DIAGNOSIS — K6289 Other specified diseases of anus and rectum: Secondary | ICD-10-CM | POA: Diagnosis not present

## 2018-02-28 DIAGNOSIS — M545 Low back pain: Secondary | ICD-10-CM | POA: Insufficient documentation

## 2018-02-28 DIAGNOSIS — G8929 Other chronic pain: Secondary | ICD-10-CM | POA: Insufficient documentation

## 2018-02-28 DIAGNOSIS — K641 Second degree hemorrhoids: Secondary | ICD-10-CM | POA: Diagnosis not present

## 2018-02-28 DIAGNOSIS — Z885 Allergy status to narcotic agent status: Secondary | ICD-10-CM | POA: Insufficient documentation

## 2018-02-28 DIAGNOSIS — F419 Anxiety disorder, unspecified: Secondary | ICD-10-CM | POA: Diagnosis not present

## 2018-02-28 DIAGNOSIS — Z880 Allergy status to penicillin: Secondary | ICD-10-CM | POA: Diagnosis not present

## 2018-02-28 DIAGNOSIS — M1990 Unspecified osteoarthritis, unspecified site: Secondary | ICD-10-CM | POA: Insufficient documentation

## 2018-02-28 DIAGNOSIS — K5909 Other constipation: Secondary | ICD-10-CM | POA: Diagnosis not present

## 2018-02-28 DIAGNOSIS — Z8249 Family history of ischemic heart disease and other diseases of the circulatory system: Secondary | ICD-10-CM | POA: Diagnosis not present

## 2018-02-28 HISTORY — DX: Low back pain, unspecified: M54.50

## 2018-02-28 HISTORY — DX: Low back pain: M54.5

## 2018-02-28 HISTORY — DX: Personal history of colonic polyps: Z86.010

## 2018-02-28 HISTORY — DX: Personal history of adenomatous and serrated colon polyps: Z86.0101

## 2018-02-28 HISTORY — PX: SPHINCTEROTOMY: SHX5279

## 2018-02-28 HISTORY — DX: Other chronic pain: G89.29

## 2018-02-28 HISTORY — DX: Unspecified osteoarthritis, unspecified site: M19.90

## 2018-02-28 HISTORY — DX: Second degree hemorrhoids: K64.1

## 2018-02-28 HISTORY — DX: Chronic anal fissure: K60.1

## 2018-02-28 HISTORY — DX: Presence of spectacles and contact lenses: Z97.3

## 2018-02-28 HISTORY — DX: Residual hemorrhoidal skin tags: K64.4

## 2018-02-28 HISTORY — PX: HEMORRHOID SURGERY: SHX153

## 2018-02-28 LAB — POCT I-STAT, CHEM 8
BUN: 7 mg/dL (ref 6–20)
CHLORIDE: 103 mmol/L (ref 101–111)
CREATININE: 0.7 mg/dL (ref 0.44–1.00)
Calcium, Ion: 1.22 mmol/L (ref 1.15–1.40)
GLUCOSE: 98 mg/dL (ref 65–99)
HEMATOCRIT: 40 % (ref 36.0–46.0)
Hemoglobin: 13.6 g/dL (ref 12.0–15.0)
Potassium: 4.1 mmol/L (ref 3.5–5.1)
Sodium: 143 mmol/L (ref 135–145)
TCO2: 24 mmol/L (ref 22–32)

## 2018-02-28 SURGERY — SPHINCTEROTOMY, ANAL
Anesthesia: General | Site: Rectum

## 2018-02-28 MED ORDER — PROPOFOL 10 MG/ML IV BOLUS
INTRAVENOUS | Status: DC | PRN
  Administered 2018-02-28: 150 mg via INTRAVENOUS

## 2018-02-28 MED ORDER — SUGAMMADEX SODIUM 200 MG/2ML IV SOLN
INTRAVENOUS | Status: DC | PRN
  Administered 2018-02-28: 200 mg via INTRAVENOUS

## 2018-02-28 MED ORDER — FENTANYL CITRATE (PF) 100 MCG/2ML IJ SOLN
25.0000 ug | INTRAMUSCULAR | Status: DC | PRN
  Administered 2018-02-28 (×2): 25 ug via INTRAVENOUS
  Filled 2018-02-28: qty 1

## 2018-02-28 MED ORDER — DIBUCAINE 1 % RE OINT
TOPICAL_OINTMENT | RECTAL | Status: DC | PRN
  Administered 2018-02-28: 1 via RECTAL

## 2018-02-28 MED ORDER — ONDANSETRON HCL 4 MG/2ML IJ SOLN
INTRAMUSCULAR | Status: DC | PRN
  Administered 2018-02-28: 4 mg via INTRAVENOUS

## 2018-02-28 MED ORDER — LIDOCAINE 2% (20 MG/ML) 5 ML SYRINGE
INTRAMUSCULAR | Status: DC | PRN
  Administered 2018-02-28: 40 mg via INTRAVENOUS
  Administered 2018-02-28: 60 mg via INTRAVENOUS

## 2018-02-28 MED ORDER — EPHEDRINE SULFATE-NACL 50-0.9 MG/10ML-% IV SOSY
PREFILLED_SYRINGE | INTRAVENOUS | Status: DC | PRN
  Administered 2018-02-28: 5 mg via INTRAVENOUS

## 2018-02-28 MED ORDER — ROCURONIUM BROMIDE 100 MG/10ML IV SOLN
INTRAVENOUS | Status: DC | PRN
  Administered 2018-02-28: 50 mg via INTRAVENOUS

## 2018-02-28 MED ORDER — PHENYLEPHRINE 40 MCG/ML (10ML) SYRINGE FOR IV PUSH (FOR BLOOD PRESSURE SUPPORT)
PREFILLED_SYRINGE | INTRAVENOUS | Status: AC
  Filled 2018-02-28: qty 10

## 2018-02-28 MED ORDER — LIDOCAINE 2% (20 MG/ML) 5 ML SYRINGE
INTRAMUSCULAR | Status: DC | PRN
  Administered 2018-02-28: 1.5 mg/kg/h via INTRAVENOUS

## 2018-02-28 MED ORDER — LIDOCAINE 2% (20 MG/ML) 5 ML SYRINGE
INTRAMUSCULAR | Status: AC
  Filled 2018-02-28: qty 5

## 2018-02-28 MED ORDER — FENTANYL CITRATE (PF) 100 MCG/2ML IJ SOLN
INTRAMUSCULAR | Status: AC
  Filled 2018-02-28: qty 2

## 2018-02-28 MED ORDER — BUPIVACAINE LIPOSOME 1.3 % IJ SUSP
INTRAMUSCULAR | Status: DC | PRN
  Administered 2018-02-28: 20 mL

## 2018-02-28 MED ORDER — GABAPENTIN 300 MG PO CAPS
300.0000 mg | ORAL_CAPSULE | ORAL | Status: AC
  Administered 2018-02-28: 300 mg via ORAL
  Filled 2018-02-28: qty 1

## 2018-02-28 MED ORDER — DEXAMETHASONE SODIUM PHOSPHATE 10 MG/ML IJ SOLN
INTRAMUSCULAR | Status: DC | PRN
  Administered 2018-02-28: 10 mg via INTRAVENOUS

## 2018-02-28 MED ORDER — CHLORHEXIDINE GLUCONATE CLOTH 2 % EX PADS
6.0000 | MEDICATED_PAD | Freq: Once | CUTANEOUS | Status: DC
  Filled 2018-02-28: qty 6

## 2018-02-28 MED ORDER — LACTATED RINGERS IV SOLN
INTRAVENOUS | Status: DC
  Administered 2018-02-28 (×2): via INTRAVENOUS
  Filled 2018-02-28: qty 1000

## 2018-02-28 MED ORDER — BUPIVACAINE-EPINEPHRINE 0.25% -1:200000 IJ SOLN
INTRAMUSCULAR | Status: DC | PRN
  Administered 2018-02-28: 20 mL

## 2018-02-28 MED ORDER — CELECOXIB 200 MG PO CAPS
200.0000 mg | ORAL_CAPSULE | ORAL | Status: AC
  Administered 2018-02-28: 200 mg via ORAL
  Filled 2018-02-28: qty 1

## 2018-02-28 MED ORDER — ACETAMINOPHEN 500 MG PO TABS
ORAL_TABLET | ORAL | Status: AC
  Filled 2018-02-28: qty 2

## 2018-02-28 MED ORDER — ACETAMINOPHEN 500 MG PO TABS
1000.0000 mg | ORAL_TABLET | ORAL | Status: AC
  Administered 2018-02-28: 1000 mg via ORAL
  Filled 2018-02-28: qty 2

## 2018-02-28 MED ORDER — OXYCODONE HCL 5 MG PO TABS: 5.0000 mg | ORAL_TABLET | Freq: Four times a day (QID) | ORAL | 0 refills | Status: DC | PRN

## 2018-02-28 MED ORDER — CLINDAMYCIN PHOSPHATE 900 MG/50ML IV SOLN
900.0000 mg | INTRAVENOUS | Status: AC
  Administered 2018-02-28: 900 mg via INTRAVENOUS
  Filled 2018-02-28: qty 50

## 2018-02-28 MED ORDER — MIDAZOLAM HCL 5 MG/5ML IJ SOLN
INTRAMUSCULAR | Status: DC | PRN
  Administered 2018-02-28: 2 mg via INTRAVENOUS

## 2018-02-28 MED ORDER — FENTANYL CITRATE (PF) 100 MCG/2ML IJ SOLN
INTRAMUSCULAR | Status: DC | PRN
  Administered 2018-02-28 (×2): 50 ug via INTRAVENOUS

## 2018-02-28 MED ORDER — ROCURONIUM BROMIDE 10 MG/ML (PF) SYRINGE
PREFILLED_SYRINGE | INTRAVENOUS | Status: AC
  Filled 2018-02-28: qty 5

## 2018-02-28 MED ORDER — METOCLOPRAMIDE HCL 5 MG/ML IJ SOLN
10.0000 mg | Freq: Once | INTRAMUSCULAR | Status: DC | PRN
  Filled 2018-02-28: qty 2

## 2018-02-28 MED ORDER — DEXAMETHASONE SODIUM PHOSPHATE 10 MG/ML IJ SOLN
INTRAMUSCULAR | Status: AC
  Filled 2018-02-28: qty 1

## 2018-02-28 MED ORDER — PROPOFOL 10 MG/ML IV BOLUS
INTRAVENOUS | Status: AC
  Filled 2018-02-28: qty 20

## 2018-02-28 MED ORDER — CELECOXIB 200 MG PO CAPS
ORAL_CAPSULE | ORAL | Status: AC
  Filled 2018-02-28: qty 1

## 2018-02-28 MED ORDER — SUGAMMADEX SODIUM 200 MG/2ML IV SOLN
INTRAVENOUS | Status: AC
  Filled 2018-02-28: qty 2

## 2018-02-28 MED ORDER — LIDOCAINE 2% (20 MG/ML) 5 ML SYRINGE
INTRAMUSCULAR | Status: AC
Start: 2018-02-28 — End: 2018-02-28
  Filled 2018-02-28: qty 5

## 2018-02-28 MED ORDER — ONDANSETRON HCL 4 MG/2ML IJ SOLN
INTRAMUSCULAR | Status: AC
  Filled 2018-02-28: qty 2

## 2018-02-28 MED ORDER — KETOROLAC TROMETHAMINE 30 MG/ML IJ SOLN
INTRAMUSCULAR | Status: AC
  Filled 2018-02-28: qty 1

## 2018-02-28 MED ORDER — MEPERIDINE HCL 25 MG/ML IJ SOLN
6.2500 mg | INTRAMUSCULAR | Status: DC | PRN
  Filled 2018-02-28: qty 1

## 2018-02-28 MED ORDER — EPHEDRINE 5 MG/ML INJ
INTRAVENOUS | Status: AC
  Filled 2018-02-28: qty 10

## 2018-02-28 MED ORDER — PHENYLEPHRINE 40 MCG/ML (10ML) SYRINGE FOR IV PUSH (FOR BLOOD PRESSURE SUPPORT)
PREFILLED_SYRINGE | INTRAVENOUS | Status: DC | PRN
Start: 2018-02-28 — End: 2018-02-28
  Administered 2018-02-28 (×3): 40 ug via INTRAVENOUS

## 2018-02-28 MED ORDER — GENTAMICIN SULFATE 40 MG/ML IJ SOLN
340.0000 mg | INTRAVENOUS | Status: AC
  Administered 2018-02-28: 340 mg via INTRAVENOUS
  Filled 2018-02-28: qty 8.5

## 2018-02-28 MED ORDER — GABAPENTIN 300 MG PO CAPS
ORAL_CAPSULE | ORAL | Status: AC
Start: 2018-02-28 — End: 2018-02-28
  Filled 2018-02-28: qty 1

## 2018-02-28 MED ORDER — OXYCODONE HCL 5 MG PO TABS
5.0000 mg | ORAL_TABLET | ORAL | Status: DC | PRN
  Administered 2018-02-28: 5 mg via ORAL
  Filled 2018-02-28: qty 1

## 2018-02-28 MED ORDER — CLINDAMYCIN PHOSPHATE 900 MG/50ML IV SOLN
INTRAVENOUS | Status: AC
Start: 2018-02-28 — End: 2018-02-28
  Filled 2018-02-28: qty 50

## 2018-02-28 MED ORDER — MIDAZOLAM HCL 2 MG/2ML IJ SOLN
INTRAMUSCULAR | Status: AC
  Filled 2018-02-28: qty 2

## 2018-02-28 MED ORDER — OXYCODONE HCL 5 MG PO TABS
ORAL_TABLET | ORAL | Status: AC
  Filled 2018-02-28: qty 1

## 2018-02-28 SURGICAL SUPPLY — 57 items
BENZOIN TINCTURE PRP APPL 2/3 (GAUZE/BANDAGES/DRESSINGS) ×3 IMPLANT
BLADE HEX COATED 2.75 (ELECTRODE) ×3 IMPLANT
BLADE SURG 10 STRL SS (BLADE) IMPLANT
BLADE SURG 15 STRL LF DISP TIS (BLADE) ×1 IMPLANT
BLADE SURG 15 STRL SS (BLADE) ×2
BRIEF STRETCH FOR OB PAD LRG (UNDERPADS AND DIAPERS) ×3 IMPLANT
CANISTER SUCTION 1200CC (MISCELLANEOUS) ×3 IMPLANT
COVER BACK TABLE 60X90IN (DRAPES) ×3 IMPLANT
COVER MAYO STAND STRL (DRAPES) ×3 IMPLANT
DECANTER SPIKE VIAL GLASS SM (MISCELLANEOUS) ×3 IMPLANT
DRAPE LAPAROTOMY 100X72 PEDS (DRAPES) ×3 IMPLANT
DRAPE LG THREE QUARTER DISP (DRAPES) IMPLANT
ELECT NEEDLE TIP 2.8 STRL (NEEDLE) IMPLANT
ELECT REM PT RETURN 9FT ADLT (ELECTROSURGICAL) ×3
ELECTRODE REM PT RTRN 9FT ADLT (ELECTROSURGICAL) ×1 IMPLANT
GAUZE SPONGE 4X4 12PLY STRL (GAUZE/BANDAGES/DRESSINGS) ×3 IMPLANT
GLOVE BIOGEL PI IND STRL 8 (GLOVE) ×1 IMPLANT
GLOVE BIOGEL PI INDICATOR 8 (GLOVE) ×2
GLOVE ECLIPSE 8.0 STRL XLNG CF (GLOVE) ×3 IMPLANT
GLOVE INDICATOR 8.0 STRL GRN (GLOVE) ×3 IMPLANT
GOWN STRL REUS W/ TWL LRG LVL3 (GOWN DISPOSABLE) ×1 IMPLANT
GOWN STRL REUS W/ TWL XL LVL3 (GOWN DISPOSABLE) ×1 IMPLANT
GOWN STRL REUS W/TWL LRG LVL3 (GOWN DISPOSABLE) ×2 IMPLANT
GOWN STRL REUS W/TWL XL LVL3 (GOWN DISPOSABLE) ×5 IMPLANT
IV CATH 22GX1 FEP (IV SOLUTION) ×3 IMPLANT
KIT TURNOVER CYSTO (KITS) ×3 IMPLANT
LEGGING LITHOTOMY PAIR STRL (DRAPES) IMPLANT
NEEDLE HYPO 22GX1.5 SAFETY (NEEDLE) ×3 IMPLANT
NS IRRIG 500ML POUR BTL (IV SOLUTION) ×3 IMPLANT
PACK BASIN DAY SURGERY FS (CUSTOM PROCEDURE TRAY) ×3 IMPLANT
PAD ABD 8X10 STRL (GAUZE/BANDAGES/DRESSINGS) ×3 IMPLANT
PENCIL BUTTON HOLSTER BLD 10FT (ELECTRODE) ×3 IMPLANT
SCRUB TECHNI CARE 4 OZ NO DYE (MISCELLANEOUS) ×3 IMPLANT
SHEARS HARMONIC 9CM CVD (BLADE) IMPLANT
SPONGE HEMORRHOID 8X3CM (HEMOSTASIS) IMPLANT
SPONGE SURGIFOAM ABS GEL 100 (HEMOSTASIS) IMPLANT
SPONGE SURGIFOAM ABS GEL 12-7 (HEMOSTASIS) IMPLANT
SURGILUBE 2OZ TUBE FLIPTOP (MISCELLANEOUS) ×3 IMPLANT
SUT CHROMIC 2 0 SH (SUTURE) ×6 IMPLANT
SUT CHROMIC 3 0 SH 27 (SUTURE) IMPLANT
SUT VIC AB 2-0 SH 27 (SUTURE)
SUT VIC AB 2-0 SH 27XBRD (SUTURE) IMPLANT
SUT VIC AB 2-0 UR6 27 (SUTURE) ×18 IMPLANT
SUT VICRYL 0 UR6 27IN ABS (SUTURE) IMPLANT
SUT VICRYL AB 2 0 TIE (SUTURE) IMPLANT
SUT VICRYL AB 2 0 TIES (SUTURE)
SYR 20CC LL (SYRINGE) ×3 IMPLANT
SYR BULB IRRIGATION 50ML (SYRINGE) ×3 IMPLANT
SYR CONTROL 10ML LL (SYRINGE) IMPLANT
TAPE CLOTH 2X10 TAN LF (GAUZE/BANDAGES/DRESSINGS) ×3 IMPLANT
TAPE CLOTH 3X10 TAN LF (GAUZE/BANDAGES/DRESSINGS) ×3 IMPLANT
TOWEL OR 17X24 6PK STRL BLUE (TOWEL DISPOSABLE) ×6 IMPLANT
TRAY DSU PREP LF (CUSTOM PROCEDURE TRAY) ×3 IMPLANT
TUBE CONNECTING 12'X1/4 (SUCTIONS) ×1
TUBE CONNECTING 12X1/4 (SUCTIONS) ×2 IMPLANT
UNDERPAD 30X30 (UNDERPADS AND DIAPERS) ×3 IMPLANT
YANKAUER SUCT BULB TIP NO VENT (SUCTIONS) ×3 IMPLANT

## 2018-02-28 NOTE — Op Note (Signed)
02/28/2018  9:49 AM  PATIENT:  Michele Duncan  67 y.o. 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 External hemorrhoids  PROCEDURE:   LATERAL INTERNAL SPHINCTEROTOMY HEMORRHOIDECTOMY x 2 ANORECTAL EXAM UNDER ANESTHESIA  SURGEON:  Adin Hector, MD  ASSISTANT: RN   ANESTHESIA:   Local field block Anorectal block General  0.25% bupivacaine with epinephrine at the beginning of the case.  Liposomal bupivacaine (Experel) at the end of the case.  EBL:  Total I/O In: 1000 [I.V.:1000] Out: 10 [Blood:10]  Delay start of Pharmacological VTE agent (>24hrs) due to surgical blood loss or risk of bleeding:  no  DRAINS: none   SPECIMEN:  Source of Specimen:  RIGHT POSTERIOR & ANTERIOR HEMORRHOIDS  DISPOSITION OF SPECIMEN:  PATHOLOGY  COUNTS:  YES  PLAN OF CARE: Discharge to home after PACU  PATIENT DISPOSITION:  PACU - hemodynamically stable.  INDICATION: Patient with chronic anal fissure refractory to bowel regimen & medical management.  Also at least external hemorrhoids with some internal hemorrhoids intermittently prolapsing with irritation.  I recommended examination and surgical treatment:  The anatomy & physiology of the anorectal region was discussed.  The pathophysiology of anal fissure and differential diagnosis was discussed.  Natural history progression  was discussed.   I stressed the importance of a bowel regimen to have daily soft bowel movements to minimize progression of disease.      The patient's condition is not adequately controlled.  Non-operative treatment has not healed the fissure.  Therefore, I recommended examination under anesthesia for better examination to confirm the diagnosis and treat by lateral internal sphincterotomy to relax the spasm better & allow the fissure to heal.  Technique, benefits, alternatives were discussed.   I noted a good likelihood this will help address the problem.  Risks such as bleeding, pain, incontinence, recurrence, heart attack, death, and other risks were discussed.    Educational handouts further explaining the pathology, treatment options, and bowel regimen were given as well.  The patient expressed understanding & wishes to proceed with surgery.  OR FINDINGS: Patient had a right posterior midline chronic anal fissure with a hypertensive sphincter.  Sphincterotomy location:  Left lateral anal canal.  60% distal internal sphincterotomy performed.  Patient also with pendulous anorectal region with circumferential external hemorrhoids.  A more dominant right posterior internal hemorrhoid with stretched out anal crypts prolapsing out = Grade 3.  Grade 2 right anterior with some external hemorrhoid as well.  Left lateral grade 2.  IDESCRIPTION:   Informed consent was confirmed. Patient underwent general anesthesia without difficulty. Patient was placed into prone positioning.  The perianal region was prepped and draped in sterile fashion. Surgical timeout confirmed or plan.  I did digital rectal examination and then transitioned over to anoscopy to get a sense of the anatomy.  I identified an anal fissure in the right posterior midline anal canal.  The sphincter tone was increased.  No stricture.  No abscess located.  No fistula.  Enlarged internal hemorrhoids especially right-sided with hypertrophic anal crypts.  Circumferential external hemorrhoid tissue is well  I went ahead and proceeded with internal sphinterotmy technique.  I used  a 2-0 Vicryl on a UR 6 and did a left lateral internal hemorrhoidal ligation about 5 cm proximal to the anal verge.  I ran that stitch distally/longitudinally.  I opened through the anoderm of the left lateral anal canal longitudinally.  I identified the internal and external sphincters.  Elevating the internal sphincter.  I proceeded with a partial internal sphincterotomy starting distally and moving proximally using cautery.  This involved the distal 60% thickness to be at the level of the proximal aspect of the anal fissure.  This provided improved relaxation of the anal sphincter.  I closed the anal canal wound vertically to the anal verge. Hemostasis was excellent.  I reexamined the anal canal.   Patient still had prominent hemorrhoids exposed and irritated.  I ended up doing 6: Hemorrhoidal arterial ligation and pexy with 2-0 Vicryl as above.  I ended up having to excise the right posterior internal hemorrhoid with its stretched out hypertrophic anal crypts as well as an external hemorrhoidal pile there.  He ended up excising a little bit of the right anterior internal and external hemorrhoidal pile as well.  I closed the external components of the wounds transversely with 2-0 chromic interrupted suture at the anal verge.  This allowed good approximation without any stricturing.  There is was no narrowing.  Hemostasis was excellent.  I repeated anoscopy and examination.  Hemostasis was good.  Patient is being extubated go to recovery room.  I am about to discuss the patient's status to the family.  Instructions are written as well.  Adin Hector, M.D., F.A.C.S. Gastrointestinal and Minimally Invasive Surgery [facility] [address] [place], [place] [address] [phone] Main / Paging

## 2018-02-28 NOTE — Discharge Instructions (Signed)
Post Anesthesia Home Care Instructions  Activity: Get plenty of rest for the remainder of the day. A responsible individual must stay with you for 24 hours following the procedure.  For the next 24 hours, DO NOT: -Drive a car -Paediatric nurse -Drink alcoholic beverages -Take any medication unless instructed by your physician -Make any legal decisions or sign important papers.  Meals: Start with liquid foods such as gelatin or soup. Progress to regular foods as tolerated. Avoid greasy, spicy, heavy foods. If nausea and/or vomiting occur, drink only clear liquids until the nausea and/or vomiting subsides. Call your physician if vomiting continues.  Special Instructions/Symptoms: Your throat may feel dry or sore from the anesthesia or the breathing tube placed in your throat during surgery. If this causes discomfort, gargle with warm salt water. The discomfort should disappear within 24 hours.  If you had a scopolamine patch placed behind your ear for the management of post- operative nausea and/or vomiting:  1. The medication in the patch is effective for 72 hours, after which it should be removed.  Wrap patch in a tissue and discard in the trash. Wash hands thoroughly with soap and water. 2. You may remove the patch earlier than 72 hours if you experience unpleasant side effects which may include dry mouth, dizziness or visual disturbances. 3. Avoid touching the patch. Wash your hands with soap and water after contact with the patch.   ANORECTAL SURGERY:  POST OPERATIVE INSTRUCTIONS  ######################################################################  EAT Start with a pureed / full liquid diet After 24 hours, gradually transition to a high fiber diet.    CONTROL PAIN Control pain so you can tolerate bowel movements,  walk, sleep, tolerate sneezing/coughing, and go up/down stairs.   HAVE A BOWEL MOVEMENT DAILY Keep your bowels regular to avoid problems.   Taking a fiber  supplement every day to keep bowels soft.   Try a laxative to override constipation. Use an antidairrheal to slow down diarrhea.   Call if not better after 2 tries  WALK Walk an hour a day.  Control your pain to do that.   CALL IF YOU HAVE PROBLEMS/CONCERNS Call if you are still struggling despite following these instructions. Call if you have concerns not answered by these instructions  ######################################################################    1. Take your usually prescribed home medications unless otherwise directed. 2. DIET: Follow a light bland diet the first 24 hours after arrival home, such as soup, liquids, crackers, etc.  Be sure to include lots of fluids daily.  Avoid fast food or heavy meals as your are more likely to get nauseated.  Eat a low fat the next few days after surgery.   3. PAIN CONTROL: a. Pain is best controlled by a usual combination of three different methods TOGETHER: i. Ice/Heat ii. Over the counter pain medication iii. Prescription pain medication b. Expect swelling and discomfort in the anus/rectal area.  Warm water baths (30-60 minutes up to 6 times a day, especially after bowel meovements) will help. Use ice for the first few days to help decrease swelling and bruising, then switch to heat such as warm towels, sitz baths, warm baths, etc to help relax tight/sore spots and speed recovery.  Some people prefer to use ice alone, heat alone, alternating between ice & heat.  Experiment to what works for you.   c. It is helpful to take an over-the-counter pain medication regularly for the first few weeks.  Choose one of the following that works best for you: i.  Naproxen (Aleve, etc)  Two 220mg  tabs twice a day ii. Ibuprofen (Advil, etc) Three 200mg  tabs four times a day (every meal & bedtime) iii. Acetaminophen (Tylenol, etc) 500-650mg  four times a day (every meal & bedtime) d. A  prescription for pain medication (such as oxycodone, hydrocodone,  etc) should be given to you upon discharge.  Take your pain medication as prescribed.  i. If you are having problems/concerns with the prescription medicine (does not control pain, nausea, vomiting, rash, itching, etc), please call us [phone] to see if we need to switch you to a different pain medicine that will work better for you and/or control your side effect better. ii. If you need a refill on your pain medication, please contact your pharmacy.  They will contact our office to request authorization. Prescriptions will not be filled after 5 pm or on week-ends.  Use a Sitz Bath 4-8 times a day for relief   [employer] A sitz bath is a warm water bath taken in the sitting position that covers only the hips and buttocks. It may be used for either healing or hygiene purposes. Sitz baths are also used to relieve pain, itching, or muscle spasms. The water may contain medicine. Moist heat will help you heal and relax.  HOME CARE INSTRUCTIONS  Take 3 to 4 sitz baths a day. 1. Fill the bathtub half full with warm water. 2. Sit in the water and open the drain a little. 3. Turn on the warm water to keep the tub half full. Keep the water running constantly. 4. Soak in the water for 15 to 20 minutes. 5. After the sitz bath, pat the affected area dry first.   4. KEEP YOUR BOWELS REGULAR a. The goal is one bowel movement a day b. Avoid getting constipated.  Between the surgery and the pain medications, it is common to experience some constipation.  Increasing fluid intake and taking a fiber supplement (such as Metamucil, Citrucel, FiberCon, MiraLax, etc) 2-3 times a day regularly will usually help prevent this problem from occurring.  A mild laxative (prune juice, Milk of Magnesia, MiraLax, etc) should be taken according to package directions if there are no bowel movements after 48 hours. c. Watch out for diarrhea.  If you have many loose bowel movements, simplify your diet to bland foods & liquids  for a few days.  Stop any stool softeners and decrease your fiber supplement.  Switching to mild anti-diarrheal medications (Kayopectate, Pepto Bismol) can help.  If this worsens or does not improve, please call us.  5. Wound Care  a. Remove your bandages with your first bowel movement, usually the day after surgery.  You may have packing if you had an abscess.  Let any packing or gauze fall come out.   b. Wear an absorbent pad or soft cotton balls in your underwear as needed to catch any drainage and help keep the area  c. Keep the area clean and dry.  Bathe / shower every day.  Keep the area clean by showering / bathing over the incision / wound.   It is okay to soak an open wound to help wash it.  Consider using a squeeze bottle filled with warm water to gently wash the anal area.  Wet wipes or showers / gentle washing after bowel movements is often less traumatic than regular toilet paper. d. Dennis Bast will often notice bleeding with bowel movements.  This should slow down by the end of the first week  of surgery.  Sitting on an ice pack can help. e. Expect some drainage.  This should slow down by the end of the first week of surgery, but you will have occasional bleeding or drainage up to a few months after surgery.  Wear an absorbent pad or soft cotton gauze in your underwear until the drainage stops.  6. ACTIVITIES as tolerated:   a. You may resume regular (light) daily activities beginning the next day--such as daily self-care, walking, climbing stairs--gradually increasing activities as tolerated.  If you can walk 30 minutes without difficulty, it is safe to try more intense activity such as jogging, treadmill, bicycling, low-impact aerobics, swimming, etc. b. Save the most intensive and strenuous activity for last such as sit-ups, heavy lifting, contact sports, etc  Refrain from any heavy lifting or straining until you are off narcotics for pain control.   c. DO NOT PUSH THROUGH PAIN.  Let pain be  your guide: If it hurts to do something, don't do it.  Pain is your body warning you to avoid that activity for another week until the pain goes down. d. You may drive when you are no longer taking prescription pain medication, you can comfortably sit for long periods of time, and you can safely maneuver your car and apply brakes. e. Dennis Bast may have sexual intercourse when it is comfortable.  7. FOLLOW UP in our office a. Please call [facility] at [phone] to set up an appointment to see your surgeon in the office for a follow-up appointment approximately 2-3 weeks after your surgery. b. Make sure that you call for this appointment the day you arrive home to ensure a convenient appointment time.  8. IF YOU HAVE DISABILITY OR FAMILY LEAVE FORMS, BRING THEM TO THE OFFICE FOR PROCESSING.  DO NOT GIVE THEM TO YOUR DOCTOR.        WHEN TO CALL us [phone]: 1. Poor pain control 2. Reactions / problems with new medications (rash/itching, nausea, etc)  3. Fever over 101.5 F (38.5 C) 4. Inability to urinate 5. Nausea and/or vomiting 6. Worsening swelling or bruising 7. Continued bleeding from incision. 8. Increased pain, redness, or drainage from the incision  The clinic staff is available to answer your questions during regular business hours (8:30am-5pm).  Please dont hesitate to call and ask to speak to one of our nurses for clinical concerns.   A surgeon from [facility] is always on call at the hospitals   If you have a medical emergency, go to the nearest emergency room or call 911.    [facility], [place], [place], [place], [place]  [address] ? MAIN: [phone] ? TOLL FREE: [phone] ? FAX [phone] [email or web address]   WHAT IS AN ANAL FISSURE? An anal fissure (fissure-in-ano) is a small, oval shaped tear in skin that lines the opening of the anus. Fissures typically cause severe pain and bleeding with bowel  movements. Fissures are quite common in the general population, but are often confused with other causes of pain and bleeding, such as hemorrhoids.  WHAT ARE THE SYMPTOMS OF AN ANAL FISSURE? The typical symptoms of an anal fissure include severe pain during, and especially after, a bowel movement, lasting from several minutes to a few hours. Patients may also notice bright red blood from the anus that can be seen on the toilet paper or on the stool. Between bowel movements, patients with anal fissures are often relatively symptom-free. Many patients are  fearful of having a bowel movement and may try to avoid defecation secondary to the pain.   WHAT CAUSES AN ANAL FISSURE? Fissures are usually caused by trauma to the inner lining of the anus. Patients with tight anal sphincter muscles (i.e., increased muscle tone) are more prone to developing anal fissures. A hard, dry bowel movement is typically responsible, but loose stools and diarrhea can also be the cause. Following a bowel movement, severe anal pain can produce spasm of the anal sphincter muscle, resulting in a decrease in blood flow to the site of the injury, thus impairing healing of the wound. The next bowel movement results in more pain, anal spasm, decreased blood flow to the area, and the cycle continues. Treatments are aimed at interrupting this cycle by relaxing the anal sphincter muscle to promote healing of the fissure.   Other, less common, causes include inflammatory conditions and certain anal infections or tumors. Anal fissures may be acute (recent onset) or chronic (present for a long period of time). Chronic fissures may be more difficult to treat, and may also have an external lump associated with the tear, called a sentinel pile or skin tag, as well as extra tissue just inside the anal canal (hypertrophied papilla) .  WHAT IS THE TREATMENT OF ANAL FISSURES? The majority of anal fissures do not require surgery. The most common  treatment for an acute anal fissure consists of making the stool more formed and bulky with a diet high in fiber and utilization of over-the-counter fiber supplementation (totaling 25-35 grams of fiber/day). Stool softeners and increasing water intake may be necessary to promote soft bowel movements and aid in the healing process. Topical anesthetics for pain and warm tub baths (sitz baths) for 10-20 minutes several times a day (especially after bowel movements) are soothing and promote relaxation of the anal muscles, which may help the healing process.  Other medications (such as diltiazem) may be prescribed that allow relaxation of the anal sphincter muscles. Your surgeon will go over benefits and side-effects of each of these with you. Narcotic pain medications are not recommended for anal fissures, as they promote constipation. Chronic fissures are generally more difficult to treat, and your surgeon may advise surgical treatment.  WILL THE PROBLEM RETURN? Fissures can recur easily, and it is quite common for a fully healed fissure to recur after a hard bowel movement or other trauma. Even when the pain and bleeding have subsided, it is very important to continue good bowel habits and a diet high in fiber as a lifestyle change. If the problem returns without an obvious cause, further assessment is warranted.  GETTING TO GOOD BOWEL HEALTH. Irregular bowel habits such as constipation and diarrhea can lead to many problems over time.  Having one soft bowel movement a day is the most important way to prevent further problems.  The anorectal canal is designed to handle stretching and feces to safely manage our ability to get rid of solid waste (feces, poop, stool) out of our body.  BUT, hard constipated stools can act like ripping concrete bricks and diarrhea can be a burning fire to this very sensitive area of our body, causing inflamed hemorrhoids, anal fissures, increasing risk is perirectal abscesses,  abdominal pain/bloating, an making irritable bowel worse.     The goal: ONE SOFT BOWEL MOVEMENT A DAY!  To have soft, regular bowel movements:   Drink at least 8 tall glasses of water a day.    Take plenty of fiber.  Fiber is the undigested part of plant food that passes into the colon, acting s natures broom to encourage bowel motility and movement.  Fiber can absorb and hold large amounts of water. This results in a larger, bulkier stool, which is soft and easier to pass. Work gradually over several weeks up to 6 servings a day of fiber (25g a day even more if needed) in the form of: o Vegetables -- Root (potatoes, carrots, turnips), leafy green (lettuce, salad greens, celery, spinach), or cooked high residue (cabbage, broccoli, etc) o Fruit -- Fresh (unpeeled skin & pulp), Dried (prunes, apricots, cherries, etc ),  or stewed ( applesauce)  o Whole grain breads, pasta, etc (whole wheat)  o Bran cereals   Bulking Agents -- This type of water-retaining fiber generally is easily obtained each day by one of the following:  o Psyllium bran -- The psyllium plant is remarkable because its ground seeds can retain so much water. This product is available as Metamucil, Konsyl, Effersyllium, Per Diem Fiber, or the less expensive generic preparation in drug and health food stores. Although labeled a laxative, it really is not a laxative.  o Methylcellulose -- This is another fiber derived from wood which also retains water. It is available as Citrucel. o Polyethylene Glycol - and artificial fiber commonly called Miralax or Glycolax.  It is helpful for people with gassy or bloated feelings with regular fiber o Flax Seed - a less gassy fiber than psyllium  No reading or other relaxing activity while on the toilet. If bowel movements take longer than 5 minutes, you are too constipated  AVOID CONSTIPATION.  High fiber and water intake usually takes care of this.  Sometimes a laxative is needed to stimulate  more frequent bowel movements, but   Laxatives are not a good long-term solution as it can wear the colon out. o Osmotics (Milk of Magnesia, Fleets phosphosoda, Magnesium citrate, MiraLax, GoLytely) are safer than  o Stimulants (Senokot, Castor Oil, Dulcolax, Ex Lax)    o Do not take laxatives for more than 7days in a row.   IF SEVERELY CONSTIPATED, try a Bowel Retraining Program: o Do not use laxatives.  o Eat a diet high in roughage, such as bran cereals and leafy vegetables.  o Drink six (6) ounces of prune or apricot juice each morning.  o Eat two (2) large servings of stewed fruit each day.  o Take one (1) heaping tablespoon of a psyllium-based bulking agent twice a day. Use sugar-free sweetener when possible to avoid excessive calories.  o Eat a normal breakfast.  o Set aside 15 minutes after breakfast to sit on the toilet, but do not strain to have a bowel movement.  o If you do not have a bowel movement by the third day, use an enema and repeat the above steps.   Controlling diarrhea o Switch to liquids and simpler foods for a few days to avoid stressing your intestines further. o Avoid dairy products (especially milk & ice cream) for a short time.  The intestines often can lose the ability to digest lactose when stressed. o Avoid foods that cause gassiness or bloating.  Typical foods include beans and other legumes, cabbage, broccoli, and dairy foods.  Every person has some sensitivity to other foods, so listen to our body and avoid those foods that trigger problems for you. o Adding fiber (Citrucel, Metamucil, psyllium, Miralax) gradually can help thicken stools by absorbing excess fluid and retrain the intestines to act  more normally.  Slowly increase the dose over a few weeks.  Too much fiber too soon can backfire and cause cramping & bloating. o Probiotics (such as active yogurt, Align, etc) may help repopulate the intestines and colon with normal bacteria and calm down a sensitive  digestive tract.  Most studies show it to be of mild help, though, and such products can be costly. o Medicines: - Bismuth subsalicylate (ex. Kayopectate, Pepto Bismol) every 30 minutes for up to 6 doses can help control diarrhea.  Avoid if pregnant. - Loperamide (Immodium) can slow down diarrhea.  Start with two tablets (4mg  total) first and then try one tablet every 6 hours.  Avoid if you are having fevers or severe pain.  If you are not better or start feeling worse, stop all medicines and call your doctor for advice o Call your doctor if you are getting worse or not better.  Sometimes further testing (cultures, endoscopy, X-ray studies, bloodwork, etc) may be needed to help diagnose and treat the cause of the diarrhea. o   WHAT CAN BE DONE IF THE FISSURE DOES NOT HEAL? A fissure that fails to respond to conservative measures should be re-examined. Persistent hard or loose bowel movements, scarring, or spasm of the internal anal muscle all contribute to delayed healing. Other medical problems such as inflammatory bowel disease (Crohns disease), infections, or anal tumors can cause symptoms similar to anal fissures. Patients suffering from persistent anal pain should be examined to exclude these symptoms. This may include a colonoscopy or an exam in the operating room under anesthesia.  WHAT DOES SURGERY INVOLVE? Surgical options for treating anal fissure include Botulinum toxin (Botox) injection into the anal sphincter and surgical division of a portion of the internal anal sphincter (lateral internal sphincterotomy). Both of these are performed typically as outpatient, same-day procedures, or occasionally in the office setting. The goal of these surgical options is to promote relaxation of the anal sphincter, thereby decreasing anal pain and spasm, allowing the fissure to heal. Botox injection results in healing in 50-80% of patients, while sphincterotomy is reported to be over 90% successful. If a  sentinel pile is present, it may be removed to promote healing of the fissure. All surgical procedures carry some risk, and a sphincterotomy can rarely interfere with ones ability to control gas and stool. Your colon and rectal surgeon will discuss these risks with you to determine the appropriate treatment for your particular situation.  HOW LONG IS THE RECOVERY AFTER SURGERY? It is important to note that complete healing with both medical and surgical treatments can take up to approximately 6-10 weeks. However, acute pain after surgery often disappears after a few days. Most patients will be able to return to work and resume daily activities in a few short days after the surgery.  CAN FISSURES LEAD TO COLON CANCER? Absolutely not. Persistent symptoms, however, need careful evaluation since other conditions other than an anal fissure can cause similar symptoms. Your colon and rectal surgeon may request additional tests, even if your fissure has successfully healed. A colonoscopy may be required to exclude other causes of rectal bleeding.   HEMORRHOIDS  The rectum is the last foot of your colon, and it naturally stretches to hold stool.  Hemorrhoidal piles are natural clusters of blood vessels that help the rectum and anal canal stretch to hold stool and allow bowel movements to eliminate feces.   Hemorrhoids are abnormally swollen blood vessels in the rectum.  Too much pressure in  the rectum causes hemorrhoids by forcing blood to stretch and bulge the walls of the veins, sometimes even rupturing them.  Hemorrhoids can become like varicose veins you might see on a person's legs.  Most people will develop a flare of hemorrhoids in their lifetime.  When bulging hemorrhoidal veins are irritated, they can swell, burn, itch, cause pain, and bleed.  Most flares will calm down gradually own within a few weeks.  However, once hemorrhoids are created, they are difficult to get rid of completely and tend to flare  more easily than the first flare.   Fortunately, good habits and simple medical treatment usually control hemorrhoids well, and surgery is needed only in severe cases. Types of Hemorrhoids:  Internal hemorrhoids usually don't initially hurt or itch; they are deep inside the rectum and usually have no sensation. If they begin to push out (prolapse), pain and burning can occur.  However, internal hemorrhoids can bleed.  Anal bleeding should not be ignored since bleeding could come from a dangerous source like colorectal cancer, so persistent rectal bleeding should be investigated by a doctor, sometimes with a colonoscopy.  External hemorrhoids cause most of the symptoms - pain, burning, and itching. Nonirritated hemorrhoids can look like small skin tags coming out of the anus.   Thrombosed hemorrhoids can form when a hemorrhoid blood vessel bursts and causes the hemorrhoid to suddenly swell.  A purple blood clot can form in it and become an excruciatingly painful lump at the anus. Because of these unpleasant symptoms, immediate incision and drainage by a surgeon at an office visit can provide much relief of the pain.    PREVENTION Avoiding the most frequent causes listed below will prevent most cases of hemorrhoids: Constipation Hard stools Diarrhea  Constant sitting  Straining with bowel movements Sitting on the toilet for a long time  Severe coughing  episodes Pregnancy / Childbirth  Heavy Lifting  Sometimes avoiding the above triggers is difficult:  How can you avoid sitting all day if you have a seated job? Also, we try to avoid coughing and diarrhea, but sometimes its beyond your control.  Still, there are some practical hints to help: Keep the anal and genital area clean.  Moistened tissues such as flushable wet wipes are less irritating than toilet paper.  Using irrigating showers or bottle irrigation washing gently cleans this sensitive area.   Avoid dry toilet paper when cleaning after  bowel movements.  Marland Kitchen Keep the anal and genital area dry.  Lightly pat the rectal area dry.  Avoid rubbing.  Talcum or baby powders can help GET YOUR STOOLS SOFT.   This is the most important way to prevent irritated hemorrhoids.  Hard stools are like sandpaper to the anorectal canal and will cause more problems.  The goal: ONE SOFT BOWEL MOVEMENT A DAY!  BMs from every other day to 3 times a day is a tolerable range Treat coughing, diarrhea and constipation early since irritated hemorrhoids may soon follow.  If your main job activity is seated, always stand or walk during your breaks. Make it a point to stand and walk at least 5 minutes every hour and try to shift frequently in your chair to avoid direct rectal pressure.  Always exhale as you strain or lift. Don't hold your breath.  Do not delay or try to prevent a bowel movement when the urge is present. Exercise regularly (walking or jogging 60 minutes a day) to stimulate the bowels to move. No reading or other  activity while on the toilet. If bowel movements take longer than 5 minutes, you are too constipated. AVOID CONSTIPATION Drink plenty of liquids (1 1/2 to 2 quarts of water and other fluids a day unless fluid restricted for another medical condition). Liquids that contain caffeine (coffee a, tea, soft drinks) can be dehydrating and should be avoided until constipation is controlled. Consider minimizing milk, as dairy products may be constipating. Eat plenty of fiber (30g a day ideal, more if needed).  Fiber is the undigested part of plant food that passes into the colon, acting as natures broom to encourage bowel motility and movement.  Fiber can absorb and hold large amounts of water. This results in a larger, bulkier stool, which is soft and easier to pass.  Eating foods high in fiber - 12 servings - such as  Vegetables: Root (potatoes, carrots, turnips), Leafy green (lettuce, salad greens, celery, spinach), High residue (cabbage, broccoli,  etc.) Fruit: Fresh, Dried (prunes, apricots, cherries), Stewed (applesauce)  Whole grain breads, pasta, whole wheat Bran cereals, muffins, etc. Consider adding supplemental bulking fiber which retains large volumes of water: Psyllium ground seeds (native plant from [place])--available as Metamucil, Konsyl, Effersyllium, Per Diem Fiber, or the less expensive generic forms.  Citrucel  (methylcellulose wood fiber) . FiberCon (Polycarbophil) Polyethylene Glycol - and artificial fiber commonly called Miralax or Glycolax.  It is helpful for people with gassy or bloated feelings with regular fiber Flax Seed - a less gassy natural fiber  Laxatives can be useful for a short period if constipation is severe Osmotics (Milk of Magnesia, Fleets Phospho-Soda, Magnesium Citrate)  Stimulants (Senokot,   Castor Oil,  Dulcolax, Ex-Lax)    Laxatives are not a good long-term solution as it can stress the bowels and cause too much mineral loss and dehydration.   Avoid taking laxatives for more than 7 days in a row.  AVOID DIARRHEA Switch to liquids and simpler foods for a few days to avoid stressing your intestines further. Avoid dairy products (especially milk & ice cream) for a short time.  The intestines often can lose the ability to digest lactose when stressed. Avoid foods that cause gassiness or bloating.  Typical foods include beans and other legumes, cabbage, broccoli, and dairy foods.  Every person has some sensitivity to other foods, so listen to your body and avoid those foods that trigger problems for you. Adding fiber (Citrucel, Metamucil, FiberCon, Flax seed, Miralax) gradually can help thicken stools by absorbing excess fluid and retrain the intestines to act more normally.  Slowly increase the dose over a few weeks.  Too much fiber too soon can backfire and cause cramping & bloating. Probiotics (such as active yogurt, Align, etc) may help repopulate the intestines and colon with normal bacteria  and calm down a sensitive digestive tract.  Most studies show it to be of mild help, though, and such products can be costly. Medicines: Bismuth subsalicylate (ex. Kayopectate, Pepto Bismol) every 30 minutes for up to 6 doses can help control diarrhea.  Avoid if pregnant. Loperamide (Immodium) can slow down diarrhea.  Start with two tablets (4mg  total) first and then try one tablet every 6 hours.  Avoid if you are having fevers or severe pain.  If you are not better or start feeling worse, stop all medicines and call your doctor for advice Call your doctor if you are getting worse or not better.  Sometimes further testing (cultures, endoscopy, X-ray studies, bloodwork, etc) may be needed to help diagnose and  treat the cause of the diarrhea.  TROUBLESHOOTING IRREGULAR BOWELS 1) Avoid extremes of bowel movements (no bad constipation/diarrhea) 2) Miralax 17gm mixed in 8oz. water or juice-daily. May use BID as needed.  3) Gas-x,Phazyme, etc. as needed for gas & bloating.  4) Soft,bland diet. No spicy,greasy,fried foods.  5) Prilosec over-the-counter as needed  6) May hold gluten/wheat products from diet to see if symptoms improve.  7)  May try probiotics (Align, Activa, etc) to help calm the bowels down 7) If symptoms become worse call back immediately.   TREATMENT OF HEMORRHOID FLARE If these preventive measures fail, you must take action right away! Hemorrhoids are one condition that can be mild in the morning and become intolerable by nightfall. Most hemorrhoidal flares take several weeks to calm down.  These suggestions can help: Warm soaks.  This helps more than any topical medication.  Use up to 8 times a day.  Usually sitz baths or sitting in a warm bathtub helps.  Sitting on moist warm towels are helpful.  Switching to ice packs/cool compresses can be helpful  Use a Sitz Bath 4-8 times a day for relief A sitz bath is a warm water bath taken in the sitting position that covers only the hips  and buttocks. It may be used for either healing or hygiene purposes. Sitz baths are also used to relieve pain, itching, or muscle spasms. The water may contain medicine. Moist heat will help you heal and relax.  HOME CARE INSTRUCTIONS  Take 3 to 4 sitz baths a day. 6. Fill the bathtub half full with warm water. 7. Sit in the water and open the drain a little. 8. Turn on the warm water to keep the tub half full. Keep the water running constantly. 9. Soak in the water for 15 to 20 minutes. 10. After the sitz bath, pat the affected area dry first. SEEK MEDICAL CARE IF:  You get worse instead of better. Stop the sitz baths if you get worse.  Normalize your bowels.  Extremes of diarrhea or constipation will make hemorrhoids worse.  One soft bowel movement a day is the goal.  Fiber can help get your bowels regular Wet wipes instead of toilet paper Pain control with a NSAID such as ibuprofen (Advil) or naproxen (Aleve) or acetaminophen (Tylenol) around the clock.  Narcotics are constipating and should be minimized if possible Topical creams contain steroids (bydrocortisone) or local anesthetic (xylocaine) can help make pain and itching more tolerable.   EVALUATION If hemorrhoids are still causing problems, you could benefit by an evaluation by a surgeon.  The surgeon will obtain a history and examine you.  If hemorrhoids are diagnosed, some therapies can be offered in the office, usually with an anoscope into the less sensitive area of the rectum: -injection of hemorrhoids (sclerotherapy) can scar the blood vessels of the swollen/enlarged hemorrhoids to help shrink them down to a more normal size -rubber banding of the enlarged hemorrhoids to help shrink them down to a more normal size -drainage of the blood clot causing a thrombosed hemorrhoid,  to relieve the severe pain   While 90% of the time such problems from hemorrhoids can be managed without preceding to surgery, sometimes the hemorrhoids  require a operation to control the problem (uncontrolled bleeding, prolapse, pain, etc.).   This involves being placed under general anesthesia where the surgeon can confirm the diagnosis and remove, suture, or staple the hemorrhoid(s).  Your surgeon can help you treat the problem appropriately.

## 2018-02-28 NOTE — Anesthesia Postprocedure Evaluation (Signed)
Anesthesia Post Note  Patient: JIANNA DRABIK  Procedure(s) Performed: LATERAL SPHINCTEROTOMY (N/A Rectum) HEMORRHOIDECTOMY (N/A Rectum) ANORECTAL EXAM UNDER ANESTHESIA (N/A Rectum)     Patient location during evaluation: PACU Anesthesia Type: General Level of consciousness: awake and alert Pain management: pain level controlled Vital Signs Assessment: post-procedure vital signs reviewed and stable Respiratory status: spontaneous breathing, nonlabored ventilation, respiratory function stable and patient connected to nasal cannula oxygen Cardiovascular status: blood pressure returned to baseline and stable Postop Assessment: no apparent nausea or vomiting Anesthetic complications: no    Last Vitals:  Vitals:   02/28/18 1045 02/28/18 1245  BP: 131/67 (!) 177/86  Pulse: 79 92  Resp: 14 16  Temp:  36.8 C  SpO2: 100% 100%    Last Pain:  Vitals:   02/28/18 1245  TempSrc: Oral  PainSc: 2                  Ryan P Ellender

## 2018-02-28 NOTE — Transfer of Care (Signed)
Immediate Anesthesia Transfer of Care Note  Patient: Michele Duncan  Procedure(s) Performed: LATERAL SPHINCTEROTOMY (N/A Rectum) HEMORRHOIDECTOMY (N/A Rectum) ANORECTAL EXAM UNDER ANESTHESIA (N/A Rectum)  Patient Location: PACU  Anesthesia Type:General  Level of Consciousness: awake and patient cooperative  Airway & Oxygen Therapy: Patient Spontanous Breathing and Patient connected to nasal cannula oxygen  Post-op Assessment: Report given to RN and Post -op Vital signs reviewed and stable  Post vital signs: Reviewed and stable  Last Vitals:  Vitals Value Taken Time  BP 127/69 02/28/2018 10:15 AM  Temp 36.6 C 02/28/2018 10:14 AM  Pulse 88 02/28/2018 10:17 AM  Resp 18 02/28/2018 10:16 AM  SpO2 100 % 02/28/2018 10:17 AM  Vitals shown include unvalidated device data.  Last Pain:  Vitals:   02/28/18 1014  TempSrc:   PainSc: 6       Patients Stated Pain Goal: 2 (16/10/96 0454)  Complications: No apparent anesthesia complications

## 2018-02-28 NOTE — Anesthesia Preprocedure Evaluation (Addendum)
Anesthesia Evaluation  Patient identified by MRN, date of birth, ID band Patient awake    Reviewed: Allergy & Precautions, NPO status , Patient's Chart, lab work & pertinent test results  Airway Mallampati: III  TM Distance: >3 FB Neck ROM: Full    Dental  (+) Caps, Poor Dentition   Pulmonary former smoker,    Pulmonary exam normal breath sounds clear to auscultation       Cardiovascular hypertension, Pt. on medications Normal cardiovascular exam Rhythm:Regular Rate:Normal     Neuro/Psych Anxiety negative neurological ROS     GI/Hepatic Neg liver ROS, Anal fissure Internal hemorrhoids   Endo/Other  negative endocrine ROS  Renal/GU negative Renal ROS  negative genitourinary   Musculoskeletal  (+) Arthritis , Chronic low back pain   Abdominal   Peds  Hematology negative hematology ROS (+)   Anesthesia Other Findings Anal fissure refractory to medical management  Reproductive/Obstetrics                            Anesthesia Physical Anesthesia Plan  ASA: II  Anesthesia Plan: General   Post-op Pain Management:    Induction: Intravenous  PONV Risk Score and Plan: 3 and Ondansetron, Dexamethasone, Midazolam and Treatment may vary due to age or medical condition  Airway Management Planned: Oral ETT  Additional Equipment:   Intra-op Plan:   Post-operative Plan: Extubation in OR  Informed Consent: I have reviewed the patients History and Physical, chart, labs and discussed the procedure including the risks, benefits and alternatives for the proposed anesthesia with the patient or authorized representative who has indicated his/her understanding and acceptance.   Dental advisory given  Plan Discussed with: CRNA and Surgeon  Anesthesia Plan Comments:        Anesthesia Quick Evaluation

## 2018-02-28 NOTE — Anesthesia Procedure Notes (Signed)
Procedure Name: Intubation Date/Time: 02/28/2018 8:42 AM Performed by: Gwyndolyn Saxon, CRNA Pre-anesthesia Checklist: Patient identified, Emergency Drugs available, Suction available, Patient being monitored and Timeout performed Patient Re-evaluated:Patient Re-evaluated prior to induction Oxygen Delivery Method: Circle system utilized Preoxygenation: Pre-oxygenation with 100% oxygen Induction Type: IV induction Ventilation: Mask ventilation without difficulty Laryngoscope Size: Miller and 2 Grade View: Grade I Tube type: Oral Tube size: 7.0 mm Number of attempts: 1 Placement Confirmation: ETT inserted through vocal cords under direct vision,  positive ETCO2,  CO2 detector and breath sounds checked- equal and bilateral Secured at: 21 cm Tube secured with: Tape Dental Injury: Teeth and Oropharynx as per pre-operative assessment

## 2018-02-28 NOTE — Interval H&P Note (Signed)
History and Physical Interval Note:  02/28/2018 7:15 AM  Michele Duncan  has presented today for surgery, with the diagnosis of Anal fissure refractory to medical management  The various methods of treatment have been discussed with the patient and family. After consideration of risks, benefits and other options for treatment, the patient has consented to  Procedure(s): ANAL SPHINCTEROTOMY ERAS PATHWAY (N/A) POSSIBLE HEMORRHOIDECTOMY (N/A) ANORECTAL EXAM UNDER ANESTHESIA (N/A) as a surgical intervention .    I have re-reviewed the the patient's records, history, medications, and allergies.  I have re-examined the patient.  I again discussed intraoperative plans and goals of post-operative recovery.  The patient agrees to proceed.  Michele Duncan  September 12, 1951 [id number]  Patient Care Team: [name], MD as PCP - General (Internal Medicine) Danie Binder, MD as Consulting Physician (Gastroenterology) Michael Boston, MD as Consulting Physician (General Surgery)  Patient Active Problem List   Diagnosis Date Noted  . Rectal bleeding 11/02/2017    Past Medical History:  Diagnosis Date  . Anxiety   . Arthritis    low back  . Chronic anal fissure   . Chronic low back pain   . Grade II internal hemorrhoids   . Hemorrhoids, external   . History of adenomatous polyp of colon    11-06-2017  . HTN (hypertension)   . Wears glasses     Past Surgical History:  Procedure Laterality Date  . BREAST BIOPSY Left 1970s   benign   . COLONOSCOPY N/A 11/06/2017   Procedure: COLONOSCOPY;  Surgeon: Danie Binder, MD;  Location: [facility];  Service: Endoscopy;  Laterality: N/A;  1:45pm    Social History   Socioeconomic History  . Marital status: Married    Spouse name: Not on file  . Number of children: Not on file  . Years of education: Not on file  . Highest education level: Not on file  Occupational History  . Not on file  Social Needs  . Financial resource strain: Not on  file  . Food insecurity:    Worry: Not on file    Inability: Not on file  . Transportation needs:    Medical: Not on file    Non-medical: Not on file  Tobacco Use  . Smoking status: Former Smoker    Types: Cigarettes    Last attempt to quit: 12/05/2013    Years since quitting: 4.2  . Smokeless tobacco: Never Used  Substance and Sexual Activity  . Alcohol use: No    Frequency: Never  . Drug use: No  . Sexual activity: Yes  Lifestyle  . Physical activity:    Days per week: Not on file    Minutes per session: Not on file  . Stress: Not on file  Relationships  . Social connections:    Talks on phone: Not on file    Gets together: Not on file    Attends religious service: Not on file    Active member of club or organization: Not on file    Attends meetings of clubs or organizations: Not on file    Relationship status: Not on file  . Intimate partner violence:    Fear of current or ex partner: Not on file    Emotionally abused: Not on file    Physically abused: Not on file    Forced sexual activity: Not on file  Other Topics Concern  . 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     Family History  Problem Relation Age of Onset  . Colon cancer Paternal Grandmother   . Colon polyps Neg Hx     Medications Prior to Admission  Medication Sig Dispense Refill Last Dose  . acetaminophen (TYLENOL) 650 MG CR tablet Take 650 mg by mouth every 8 (eight) hours as needed for pain.   02/27/2018 at Unknown time  . Cholecalciferol (VITAMIN D-3 PO) Take 7,000 Int'l Units by mouth daily.   02/27/2018 at Unknown time  . losartan-hydrochlorothiazide (HYZAAR) 50-12.5 MG tablet Take 1 tablet by mouth every morning.    02/27/2018 at Unknown time  . potassium chloride SA (K-DUR,KLOR-CON) 20 MEQ tablet Take 40 mEq by mouth every morning.    02/27/2018 at Unknown time  . Wheat Dextrin (BENEFIBER) POWD Take by mouth daily.   Past Week at Unknown time  .  diltiazem 2 % GEL Apply 1 application topically 2 (two) times daily as needed.   Unknown at Unknown time    Current Facility-Administered Medications  Medication Dose Route Frequency Provider Last Rate Last Dose  . Chlorhexidine Gluconate Cloth 2 % PADS 6 each  6 each Topical Once Michael Boston, MD       And  . Chlorhexidine Gluconate Cloth 2 % PADS 6 each  6 each Topical Once Michael Boston, MD      . clindamycin (CLEOCIN) IVPB 900 mg  900 mg Intravenous On Call to OR Michael Boston, MD       And  . gentamicin (GARAMYCIN) 5 mg/kg in dextrose 5 % 100 mL IVPB  5 mg/kg Intravenous On Call to OR Michael Boston, MD      . lactated ringers infusion   Intravenous Continuous Ellender, Karyl Kinnier, MD         Allergies  Allergen Reactions  . Penicillins Shortness Of Breath and Itching    Has patient had a PCN reaction causing immediate rash, facial/tongue/throat swelling, SOB or lightheadedness with hypotension: Yes Has patient had a PCN reaction causing severe rash involving mucus membranes or skin necrosis: No Has patient had a PCN reaction that required hospitalization: No Has patient had a PCN reaction occurring within the last 10 years: No If all of the above answers are "NO", then may proceed with Cephalosporin use.   . Codeine Itching    BP (!) 144/74 (BP Location: Left Arm, Patient Position: Sitting)   Pulse 72   Temp 98.4 F (36.9 C) (Oral)   Resp 18   Ht 5\' 4"  (1.626 m)   Wt 75.3 kg (166 lb)   SpO2 98%   BMI 28.49 kg/m   Labs: No results found for this or any previous visit (from the past 48 hour(s)).  Imaging / Studies: No results found.   Adin Hector, M.D., F.A.C.S. Gastrointestinal and Minimally Invasive Surgery [facility] [address] [place], [place] [address] [phone] Main / Paging  02/28/2018 7:15 AM   The patient's history has been reviewed, patient examined, no change in status, stable for surgery.  I have reviewed  the patient's chart and labs.  Questions were answered to the patient's satisfaction.     Adin Hector

## 2018-03-01 ENCOUNTER — Encounter (HOSPITAL_BASED_OUTPATIENT_CLINIC_OR_DEPARTMENT_OTHER): Payer: Self-pay | Admitting: Surgery

## 2018-03-03 ENCOUNTER — Other Ambulatory Visit: Payer: Self-pay | Admitting: Surgery

## 2018-03-03 MED ORDER — SULFAMETHOXAZOLE-TRIMETHOPRIM 800-160 MG PO TABS: 1.0000 | ORAL_TABLET | Freq: Two times a day (BID) | ORAL | 0 refills | Status: AC

## 2018-03-03 NOTE — Progress Notes (Signed)
Pt calls c/o urgency and burning with urination worsening since Thursday. Had been rx/d Cipro by PCP on 3/21, does not know what this was for. Will order bactrim, faxed to pharmacy, advised if no improvement to call PCP for further evaluation

## 2018-03-20 ENCOUNTER — Emergency Department (HOSPITAL_COMMUNITY)
Admission: EM | Admit: 2018-03-20 | Discharge: 2018-03-21 | Disposition: A | Discharge status: Home / Self Care | Payer: BLUE CROSS/BLUE SHIELD | Attending: Emergency Medicine | Admitting: Emergency Medicine

## 2018-03-20 ENCOUNTER — Encounter (HOSPITAL_COMMUNITY): Payer: Self-pay | Admitting: Emergency Medicine

## 2018-03-20 ENCOUNTER — Emergency Department (HOSPITAL_COMMUNITY): Payer: BLUE CROSS/BLUE SHIELD

## 2018-03-20 ENCOUNTER — Other Ambulatory Visit: Payer: Self-pay

## 2018-03-20 DIAGNOSIS — B9689 Other specified bacterial agents as the cause of diseases classified elsewhere: Secondary | ICD-10-CM

## 2018-03-20 DIAGNOSIS — Z79899 Other long term (current) drug therapy: Secondary | ICD-10-CM | POA: Insufficient documentation

## 2018-03-20 DIAGNOSIS — R1030 Lower abdominal pain, unspecified: Secondary | ICD-10-CM | POA: Diagnosis present

## 2018-03-20 DIAGNOSIS — K5903 Drug induced constipation: Secondary | ICD-10-CM | POA: Insufficient documentation

## 2018-03-20 DIAGNOSIS — Z87891 Personal history of nicotine dependence: Secondary | ICD-10-CM | POA: Insufficient documentation

## 2018-03-20 DIAGNOSIS — E876 Hypokalemia: Secondary | ICD-10-CM | POA: Insufficient documentation

## 2018-03-20 DIAGNOSIS — N760 Acute vaginitis: Secondary | ICD-10-CM | POA: Diagnosis not present

## 2018-03-20 LAB — COMPREHENSIVE METABOLIC PANEL
ALBUMIN: 4.4 g/dL (ref 3.5–5.0)
ALK PHOS: 51 U/L (ref 38–126)
ALT: 12 U/L — ABNORMAL LOW (ref 14–54)
AST: 15 U/L (ref 15–41)
Anion gap: 14 (ref 5–15)
BILIRUBIN TOTAL: 0.6 mg/dL (ref 0.3–1.2)
BUN: 6 mg/dL (ref 6–20)
CALCIUM: 9.7 mg/dL (ref 8.9–10.3)
CO2: 26 mmol/L (ref 22–32)
Chloride: 92 mmol/L — ABNORMAL LOW (ref 101–111)
Creatinine, Ser: 0.63 mg/dL (ref 0.44–1.00)
GFR calc Af Amer: >60 mL/min (ref >60)
GLUCOSE: 107 mg/dL — AB (ref 65–99)
Potassium: 3.1 mmol/L — ABNORMAL LOW (ref 3.5–5.1)
Sodium: 132 mmol/L — ABNORMAL LOW (ref 135–145)
TOTAL PROTEIN: 7.4 g/dL (ref 6.5–8.1)

## 2018-03-20 LAB — URINALYSIS, ROUTINE W REFLEX MICROSCOPIC
Bilirubin Urine: NEGATIVE
Glucose, UA: NEGATIVE mg/dL
Ketones, ur: NEGATIVE mg/dL
Leukocytes, UA: NEGATIVE
Nitrite: NEGATIVE
Protein, ur: NEGATIVE mg/dL
Specific Gravity, Urine: 1.002 — ABNORMAL LOW (ref 1.005–1.030)
pH: 7 (ref 5.0–8.0)

## 2018-03-20 LAB — CBC
HCT: 35.6 % — ABNORMAL LOW (ref 36.0–46.0)
Hemoglobin: 11.9 g/dL — ABNORMAL LOW (ref 12.0–15.0)
MCH: 28.6 pg (ref 26.0–34.0)
MCHC: 33.4 g/dL (ref 30.0–36.0)
MCV: 85.6 fL (ref 78.0–100.0)
Platelets: 311 10*3/uL (ref 150–400)
RBC: 4.16 MIL/uL (ref 3.87–5.11)
RDW: 12.4 % (ref 11.5–15.5)
WBC: 8.3 10*3/uL (ref 4.0–10.5)

## 2018-03-20 LAB — LIPASE, BLOOD: Lipase: 42 U/L (ref 11–51)

## 2018-03-20 MED ORDER — ONDANSETRON HCL 4 MG/2ML IJ SOLN
4.0000 mg | Freq: Once | INTRAMUSCULAR | Status: AC
  Administered 2018-03-20: 4 mg via INTRAVENOUS
  Filled 2018-03-20: qty 2

## 2018-03-20 NOTE — ED Triage Notes (Addendum)
Pt had an anal fissure repair and hemorrhoidectomy 3 weeks ago, had constipation since. Had been taking Miralax q2h until having a BM today around 1600. Pt reports increased abd discomfort since. Had some blood in BM and a very hard BM. Also c/o dysuria.

## 2018-03-21 LAB — WET PREP, GENITAL
SPERM: NONE SEEN
Trich, Wet Prep: NONE SEEN
Yeast Wet Prep HPF POC: NONE SEEN

## 2018-03-21 MED ORDER — POTASSIUM CHLORIDE CRYS ER 20 MEQ PO TBCR
20.0000 meq | EXTENDED_RELEASE_TABLET | Freq: Once | ORAL | Status: AC
  Administered 2018-03-21: 20 meq via ORAL
  Filled 2018-03-21: qty 1

## 2018-03-21 MED ORDER — METRONIDAZOLE 500 MG PO TABS
500.0000 mg | ORAL_TABLET | Freq: Once | ORAL | Status: AC
  Administered 2018-03-21: 500 mg via ORAL
  Filled 2018-03-21: qty 1

## 2018-03-21 MED ORDER — METRONIDAZOLE 500 MG PO TABS: 500.0000 mg | ORAL_TABLET | Freq: Two times a day (BID) | ORAL | 0 refills | Status: DC

## 2018-03-21 NOTE — ED Provider Notes (Signed)
[facility] EMERGENCY DEPARTMENT Provider Note   CSN: [id number] Arrival date & time: 03/20/18  2122     History   Chief Complaint Chief Complaint  Patient presents with  . Abdominal Pain    HPI Michele Duncan is a 67 y.o. female with past medical history significant for hemorrhoidectomy and anal fissure repair surgery 3 weeks ago by Dr. Johney Maine presenting with persistent rectal pain and constipation since the surgery.  She reports she was told would probably have pain for weeks after the surgery, but believes her pain was exacerbated by persistent constipation, although had a hard bm this afternoon after taking miralax q 2 hours since yesterday at her surgeons recommendation and her pain is finally starting to improve some.  Also reports having traces of blood on her stool with bms since surgery. denies current rectal pain, also no fevers, chills, or abdominal distention. She does have mild nausea without emesis.  She also describes burning vaginal pain without vaginal discharge, sx worsened with urination. She denies increased urinary frequency, hematuria, no back or flank pain. She has completed 2 courses of abx, bactrim and cipro without improvement.  The history is provided by the patient and the spouse.    Past Medical History:  Diagnosis Date  . Anxiety   . Arthritis    low back  . Chronic anal fissure   . Chronic low back pain   . Grade II internal hemorrhoids   . Hemorrhoids, external   . History of adenomatous polyp of colon    11-06-2017  . HTN (hypertension)   . Wears glasses     Patient Active Problem List   Diagnosis Date Noted  . Chronic posterior anal fissure 02/28/2018  . External hemorrhoids with pain 02/28/2018  . Rectal bleeding 11/02/2017    Past Surgical History:  Procedure Laterality Date  . BREAST BIOPSY Left 1970s   benign   . COLONOSCOPY N/A 11/06/2017   Procedure: COLONOSCOPY;  Surgeon: Danie Binder, MD;  [facility];  Service:  Endoscopy;  Laterality: N/A;  1:45pm  . HEMORRHOID SURGERY N/A 02/28/2018   Procedure: HEMORRHOIDECTOMY;  Surgeon: Michael Boston, MD;  Location: [facility];  Service: General;  Laterality: N/A;  . SPHINCTEROTOMY N/A 02/28/2018   Procedure: LATERAL SPHINCTEROTOMY;  Surgeon: Michael Boston, MD;  Location: [facility];  Service: General;  Laterality: N/A;     OB History   None      Home Medications    Prior to Admission medications   Medication Sig Start Date End Date Taking? Authorizing Provider  acetaminophen (TYLENOL) 650 MG CR tablet Take 650 mg by mouth every 8 (eight) hours as needed for pain.   Yes [provider]  aluminum hydroxide-magnesium carbonate (GAVISCON) 95-358 MG/15ML SUSP Take 15 mLs by mouth as needed for indigestion.   Yes [provider]  Ca Carbonate-Mag Hydroxide (ROLAIDS) 550-110 MG CHEW Chew 1-2 each by mouth daily as needed (for indigestion).   Yes [provider]  Cholecalciferol (VITAMIN D-3 PO) Take 7,000 Int'l Units by mouth daily.   Yes [provider]  diltiazem 2 % GEL Apply 1 application topically 2 (two) times daily as needed.   Yes [provider]  ibuprofen (ADVIL,MOTRIN) 200 MG tablet Take 600 mg by mouth every 6 (six) hours as needed.   Yes [provider]  losartan-hydrochlorothiazide (HYZAAR) 50-12.5 MG tablet Take 1 tablet by mouth every morning.    Yes [provider]  ondansetron (ZOFRAN-ODT) 4 MG disintegrating tablet 4 mg every 8 (eight) hours as needed for nausea or vomiting.  03/05/18  Yes [provider]  potassium chloride SA (K-DUR,KLOR-CON) 20 MEQ tablet Take 40 mEq by mouth every morning.    Yes [provider]  Wheat Dextrin (BENEFIBER) POWD Take by mouth daily.   Yes [provider]  fluconazole (DIFLUCAN) 200 MG tablet Take 200 mg by mouth once.  03/07/18   [provider]  metroNIDAZOLE (FLAGYL) 500 MG tablet Take  1 tablet (500 mg total) by mouth 2 (two) times daily. 03/21/18   Evalee Jefferson, PA-C  oxyCODONE (OXY IR/ROXICODONE) 5 MG immediate release tablet Take 1-2 tablets (5-10 mg total) by mouth every 6 (six) hours as needed for moderate pain, severe pain or breakthrough pain. Patient not taking: Reported on 03/20/2018 02/28/18   Michael Boston, MD    Family History Family History  Problem Relation Age of Onset  . Colon cancer Paternal Grandmother   . Colon polyps Neg Hx     Social History Social History   Tobacco Use  . Smoking status: Former Smoker    Types: Cigarettes    Last attempt to quit: 12/05/2013    Years since quitting: 4.2  . Smokeless tobacco: Never Used  Substance Use Topics  . Alcohol use: No    Frequency: Never  . Drug use: No     Allergies   Penicillins and Codeine   Review of Systems Review of Systems  Constitutional: Negative for fever.  HENT: Negative for congestion and sore throat.   Eyes: Negative.   Respiratory: Negative for chest tightness and shortness of breath.   Cardiovascular: Negative for chest pain.  Gastrointestinal: Positive for abdominal pain, anal bleeding and constipation. Negative for nausea.  Genitourinary: Positive for dysuria and vaginal pain.  Musculoskeletal: Negative for arthralgias, joint swelling and neck pain.  Skin: Negative.  Negative for rash and wound.  Neurological: Negative for dizziness, weakness, light-headedness, numbness and headaches.  Psychiatric/Behavioral: Negative.      Physical Exam Updated Vital Signs BP (!) 155/72   Pulse 80   Temp 98.3 F (36.8 C) (Oral)   Resp 16   Ht 5\' 4"  (1.626 m)   Wt 74.8 kg (165 lb)   SpO2 95%   BMI 28.32 kg/m   Physical Exam  Constitutional: She appears well-developed and well-nourished.  HENT:  Head: Normocephalic and atraumatic.  Eyes: Conjunctivae are normal.  Neck: Normal range of motion.  Cardiovascular: Normal rate, regular rhythm, normal heart sounds and intact distal  pulses.  Pulmonary/Chest: Effort normal and breath sounds normal. She has no wheezes.  Abdominal: Soft. Bowel sounds are normal. She exhibits no distension. There is no rigidity and no guarding.  No appreciable ttp on exam at this time.  Genitourinary: Uterus normal. Rectal exam shows no fissure. Uterus is not tender. Cervix exhibits no motion tenderness and no discharge. Right adnexum displays no mass and no tenderness. Left adnexum displays no mass and no tenderness. Vaginal discharge found.  Musculoskeletal: Normal range of motion.  Neurological: She is alert.  Skin: Skin is warm and dry.  Psychiatric: She has a normal mood and affect.  Nursing note and vitals reviewed.    ED Treatments / Results  Labs (all labs ordered are listed, but only abnormal results are displayed) Labs Reviewed  WET PREP, GENITAL - Abnormal; Notable for the following components:      Result Value   Clue Cells Wet Prep HPF POC PRESENT (*)  WBC, Wet Prep HPF POC MANY (*)    All other components within normal limits  COMPREHENSIVE METABOLIC PANEL - Abnormal; Notable for the following components:   Sodium 132 (*)    Potassium 3.1 (*)    Chloride 92 (*)    Glucose, Bld 107 (*)    ALT 12 (*)    All other components within normal limits  CBC - Abnormal; Notable for the following components:   Hemoglobin 11.9 (*)    HCT 35.6 (*)    All other components within normal limits  URINALYSIS, ROUTINE W REFLEX MICROSCOPIC - Abnormal; Notable for the following components:   Color, Urine COLORLESS (*)    Specific Gravity, Urine 1.002 (*)    Hgb urine dipstick SMALL (*)    Bacteria, UA RARE (*)    Squamous Epithelial / LPF 0-5 (*)    All other components within normal limits  LIPASE, BLOOD  GC/CHLAMYDIA PROBE AMP ([facility]) NOT AT [facility]    EKG None  Radiology Dg Abd 2 Views  Result Date: 03/20/2018 CLINICAL DATA:  Subacute onset of lower abdominal pain and constipation. Recent hemorrhoid removal. EXAM:  ABDOMEN - 2 VIEW COMPARISON:  Lumbar spine radiographs performed 10/25/2017 FINDINGS: The visualized bowel gas pattern is unremarkable. Scattered air filled loops of colon are seen; no abnormal dilatation of small bowel loops is seen to suggest small bowel obstruction. No free intra-abdominal air is identified, though evaluation for free air is limited on a single supine view. The visualized osseous structures are within normal limits; the sacroiliac joints are unremarkable in appearance. The visualized lung bases are essentially clear. IMPRESSION: Unremarkable bowel gas pattern; no free intra-abdominal air seen. No radiographic evidence of constipation. Electronically Signed   By: Garald Balding M.D.   On: 03/20/2018 23:39    Procedures Procedures (including critical care time)  Medications Ordered in ED Medications  metroNIDAZOLE (FLAGYL) tablet 500 mg (has no administration in time range)  ondansetron (ZOFRAN) injection 4 mg (4 mg Intravenous Given 03/20/18 2255)  potassium chloride SA (K-DUR,KLOR-CON) CR tablet 20 mEq (20 mEq Oral Given 03/21/18 0036)     Initial Impression / Assessment and Plan / ED Course  I have reviewed the triage vital signs and the nursing notes.  Pertinent labs & imaging results that were available during my care of the patient were reviewed by me and considered in my medical decision making (see chart for details).     Imaging and labs reviewed and discussed with pt and husband. No acute findings per work up or exam, suspect dysuria associated with bv, will cover with flagyl, first dose given. She does have some hypokalemia, pt reports hx of this and has k tabs at home, has not been taking, advised to add back.  Constipation probably from pain meds from recent surgery, advised to continue using miralax daily - discussed that it takes 1-2 days to see results of softer stools from this med and she should continue taking. No acute abd findings, imaging negative for free  air.  No obvious anal injury from passage of hard stool. Advised f/u with her surgeon as needed.   Final Clinical Impressions(s) / ED Diagnoses   Final diagnoses:  Bacterial vaginosis  Drug-induced constipation  Hypokalemia    ED Discharge Orders        Ordered    metroNIDAZOLE (FLAGYL) 500 MG tablet  2 times daily     03/21/18 0112       Evalee Jefferson, PA-C 03/22/18  1232    Margette Fast, MD 03/23/18 [phone]

## 2018-03-21 NOTE — Discharge Instructions (Addendum)
Continue taking your miralax, you may need to adjust your dosing until you find the right dose that works for you.  Start taking your potassium again as discussed as your level was low tonight.  Take the entire course of the flagyl for your vaginal infection.  You may look for Vagisil (no prescription needed) to help with vaginal irritation if needed for symptom relief.

## 2018-03-22 LAB — GC/CHLAMYDIA PROBE AMP (~~LOC~~) NOT AT ARMC
Chlamydia: NEGATIVE
NEISSERIA GONORRHEA: NEGATIVE

## 2018-03-24 ENCOUNTER — Emergency Department (HOSPITAL_COMMUNITY)
Admission: EM | Admit: 2018-03-24 | Discharge: 2018-03-24 | Disposition: A | Discharge status: Home / Self Care | Payer: BLUE CROSS/BLUE SHIELD | Attending: Emergency Medicine | Admitting: Emergency Medicine

## 2018-03-24 ENCOUNTER — Encounter (HOSPITAL_COMMUNITY): Payer: Self-pay | Admitting: Emergency Medicine

## 2018-03-24 ENCOUNTER — Emergency Department (HOSPITAL_COMMUNITY): Payer: BLUE CROSS/BLUE SHIELD

## 2018-03-24 ENCOUNTER — Other Ambulatory Visit: Payer: Self-pay

## 2018-03-24 DIAGNOSIS — R109 Unspecified abdominal pain: Secondary | ICD-10-CM

## 2018-03-24 DIAGNOSIS — R1013 Epigastric pain: Secondary | ICD-10-CM | POA: Insufficient documentation

## 2018-03-24 DIAGNOSIS — I10 Essential (primary) hypertension: Secondary | ICD-10-CM | POA: Diagnosis not present

## 2018-03-24 DIAGNOSIS — R1011 Right upper quadrant pain: Secondary | ICD-10-CM | POA: Insufficient documentation

## 2018-03-24 DIAGNOSIS — Z87891 Personal history of nicotine dependence: Secondary | ICD-10-CM | POA: Diagnosis not present

## 2018-03-24 LAB — URINALYSIS, ROUTINE W REFLEX MICROSCOPIC
BILIRUBIN URINE: NEGATIVE
GLUCOSE, UA: NEGATIVE mg/dL
Hgb urine dipstick: NEGATIVE
KETONES UR: NEGATIVE mg/dL
LEUKOCYTES UA: NEGATIVE
NITRITE: NEGATIVE
PH: 7 (ref 5.0–8.0)
PROTEIN: NEGATIVE mg/dL
Specific Gravity, Urine: 1.002 — ABNORMAL LOW (ref 1.005–1.030)

## 2018-03-24 LAB — COMPREHENSIVE METABOLIC PANEL
ALBUMIN: 4.9 g/dL (ref 3.5–5.0)
ALK PHOS: 56 U/L (ref 38–126)
ALT: 21 U/L (ref 14–54)
AST: 22 U/L (ref 15–41)
Anion gap: 13 (ref 5–15)
BILIRUBIN TOTAL: 0.7 mg/dL (ref 0.3–1.2)
BUN: 7 mg/dL (ref 6–20)
CO2: 25 mmol/L (ref 22–32)
CREATININE: 0.63 mg/dL (ref 0.44–1.00)
Calcium: 9.7 mg/dL (ref 8.9–10.3)
Chloride: 95 mmol/L — ABNORMAL LOW (ref 101–111)
GFR calc Af Amer: >60 mL/min (ref >60)
GLUCOSE: 104 mg/dL — AB (ref 65–99)
Potassium: 3.4 mmol/L — ABNORMAL LOW (ref 3.5–5.1)
Sodium: 133 mmol/L — ABNORMAL LOW (ref 135–145)
TOTAL PROTEIN: 7.7 g/dL (ref 6.5–8.1)

## 2018-03-24 LAB — CBC
HEMATOCRIT: 37.1 % (ref 36.0–46.0)
Hemoglobin: 12.3 g/dL (ref 12.0–15.0)
MCH: 28.8 pg (ref 26.0–34.0)
MCHC: 33.2 g/dL (ref 30.0–36.0)
MCV: 86.9 fL (ref 78.0–100.0)
PLATELETS: 317 10*3/uL (ref 150–400)
RBC: 4.27 MIL/uL (ref 3.87–5.11)
RDW: 12.6 % (ref 11.5–15.5)
WBC: 7.5 10*3/uL (ref 4.0–10.5)

## 2018-03-24 LAB — TROPONIN I: Troponin I: <0.03 ng/mL (ref <0.03)

## 2018-03-24 LAB — LIPASE, BLOOD: Lipase: 46 U/L (ref 11–51)

## 2018-03-24 MED ORDER — SUCRALFATE 1 GM/10ML PO SUSP
1.0000 g | Freq: Three times a day (TID) | ORAL | Status: DC
  Administered 2018-03-24: 1 g via ORAL
  Filled 2018-03-24: qty 10

## 2018-03-24 MED ORDER — IOPAMIDOL (ISOVUE-300) INJECTION 61%
100.0000 mL | Freq: Once | INTRAVENOUS | Status: AC | PRN
  Administered 2018-03-24: 100 mL via INTRAVENOUS

## 2018-03-24 MED ORDER — FAMOTIDINE 20 MG PO TABS
20.0000 mg | ORAL_TABLET | Freq: Once | ORAL | Status: AC
  Administered 2018-03-24: 20 mg via ORAL
  Filled 2018-03-24: qty 1

## 2018-03-24 MED ORDER — PANTOPRAZOLE SODIUM 20 MG PO TBEC: 20.0000 mg | DELAYED_RELEASE_TABLET | Freq: Every day | ORAL | 0 refills | Status: DC

## 2018-03-24 MED ORDER — PANTOPRAZOLE SODIUM 40 MG PO TBEC
40.0000 mg | DELAYED_RELEASE_TABLET | Freq: Every day | ORAL | Status: DC
  Administered 2018-03-24: 40 mg via ORAL
  Filled 2018-03-24: qty 1

## 2018-03-24 MED ORDER — FAMOTIDINE 20 MG PO TABS: 20.0000 mg | ORAL_TABLET | Freq: Two times a day (BID) | ORAL | 0 refills | Status: DC

## 2018-03-24 MED ORDER — GI COCKTAIL ~~LOC~~
30.0000 mL | Freq: Once | ORAL | Status: AC
Start: 2018-03-24 — End: 2018-03-24
  Administered 2018-03-24: 30 mL via ORAL
  Filled 2018-03-24: qty 30

## 2018-03-24 NOTE — ED Notes (Signed)
Pt remains in CT at this time.  

## 2018-03-24 NOTE — ED Notes (Signed)
Pt to CT. Will get EKG once patient gets back

## 2018-03-24 NOTE — ED Triage Notes (Signed)
Patient complains of nausea that started 1 week ago. She states she was here 03/20/2018 for vaginal pain and was given antibiotics. She states that nausea has been going on since before starting antibiotics.

## 2018-03-24 NOTE — ED Provider Notes (Signed)
Emergency Department Provider Note   I have reviewed the triage vital signs and the nursing notes.   HISTORY  Chief Complaint Abdominal Pain   HPI Michele Duncan is a 67 y.o. female with multiple medical problems documented below the presents to the emergency department today with her fourth day of abdominal pain with nausea.  Patient was seen a few days ago because she had vaginal discharge, itching and pelvic discomfort.  She is diagnosed with bacterial vaginosis that time and started on antibiotics but her symptoms have not improved.  She still has epigastric and right upper quadrant abdominal pain associate with nausea and a feeling of bloating that has been present since at least Monday.  She recently, 3 weeks ago, had a hemorrhoidectomy and anal fissure repair and had some constipation associated with that but she states that now she is having diarrhea or at least not hard stools anymore.  Has not had any vomiting but is nauseous.  No fevers.  No cough or chest pain. No other associated or modifying symptoms.    Past Medical History:  Diagnosis Date  . Anxiety   . Arthritis    low back  . Chronic anal fissure   . Chronic low back pain   . Grade II internal hemorrhoids   . Hemorrhoids, external   . History of adenomatous polyp of colon    11-06-2017  . HTN (hypertension)   . Wears glasses     Patient Active Problem List   Diagnosis Date Noted  . Chronic posterior anal fissure 02/28/2018  . External hemorrhoids with pain 02/28/2018  . Rectal bleeding 11/02/2017    Past Surgical History:  Procedure Laterality Date  . BREAST BIOPSY Left 1970s   benign   . COLONOSCOPY N/A 11/06/2017   Procedure: COLONOSCOPY;  Surgeon: Danie Binder, MD;  [facility];  Service: Endoscopy;  Laterality: N/A;  1:45pm  . HEMORRHOID SURGERY N/A 02/28/2018   Procedure: HEMORRHOIDECTOMY;  Surgeon: Michael Boston, MD;  Location: [facility];  Service: General;   Laterality: N/A;  . SPHINCTEROTOMY N/A 02/28/2018   Procedure: LATERAL SPHINCTEROTOMY;  Surgeon: Michael Boston, MD;  Location: [place];  Service: General;  Laterality: N/A;    Current Outpatient Rx  . Order #: [id number] Class: Historical Med  . Order #: [id number] Class: Historical Med  . Order #: [id number] Class: Historical Med  . Order #: [id number] Class: Historical Med  . Order #: [id number] Class: Historical Med  . Order #: [id number] Class: Historical Med  . Order #: [id number] Class: Print  . Order #: [id number] Class: Historical Med  . Order #: [id number] Class: Print  . Order #: [id number] Class: Historical Med  . Order #: [id number] Class: Historical Med  . Order #: [id number] Class: Print  . Order #: [id number] Class: Historical Med  . Order #: [id number] Class: Print  . Order #: [id number] Class: Historical Med    Allergies Penicillins and Codeine  Family History  Problem Relation Age of Onset  . Colon cancer Paternal Grandmother   . Colon polyps Neg Hx     Social History Social History   Tobacco Use  . Smoking status: Former Smoker    Types: Cigarettes    Last attempt to quit: 12/05/2013    Years since quitting: 4.3  . Smokeless tobacco: Never Used  Substance Use Topics  . Alcohol use: No    Frequency: Never  . Drug use: No    Review of Systems  All other systems negative except as documented in the HPI.  All pertinent positives and negatives as reviewed in the HPI. ____________________________________________   PHYSICAL EXAM:  VITAL SIGNS: ED Triage Vitals  Enc Vitals Group     BP 03/24/18 1432 (!) 157/87     Pulse Rate 03/24/18 1432 100     Resp 03/24/18 1432 16     Temp 03/24/18 1432 98.1 F (36.7 C)     Temp Source 03/24/18 1432 Oral     SpO2 03/24/18 1432 99 %     Weight 03/24/18 1430 165 lb (74.8 kg)     Height 03/24/18 1430 5\' 4"  (1.626 m)    Constitutional: Alert and oriented. Well appearing and in no acute distress. Eyes: Conjunctivae are  normal. PERRL. EOMI. Head: Atraumatic. Nose: No congestion/rhinnorhea. Mouth/Throat: Mucous membranes are moist.  Oropharynx non-erythematous. Neck: No stridor.  No meningeal signs.   Cardiovascular: Normal rate, regular rhythm. Good peripheral circulation. Grossly normal heart sounds.   Respiratory: Normal respiratory effort.  No retractions. Lungs CTAB. Gastrointestinal: Soft and ttp in RUQ and epigastric area. No distention.  Musculoskeletal: No lower extremity tenderness nor edema. No gross deformities of extremities. Neurologic:  Normal speech and language. No gross focal neurologic deficits are appreciated.  Skin:  Skin is warm, dry and intact. No rash noted.   ____________________________________________   LABS (all labs ordered are listed, but only abnormal results are displayed)  Labs Reviewed  COMPREHENSIVE METABOLIC PANEL - Abnormal; Notable for the following components:      Result Value   Sodium 133 (*)    Potassium 3.4 (*)    Chloride 95 (*)    Glucose, Bld 104 (*)    All other components within normal limits  URINALYSIS, ROUTINE W REFLEX MICROSCOPIC - Abnormal; Notable for the following components:   Color, Urine COLORLESS (*)    Specific Gravity, Urine 1.002 (*)    All other components within normal limits  LIPASE, BLOOD  CBC  TROPONIN I   ____________________________________________  EKG   EKG Interpretation  Date/Time:  Saturday March 24 2018 17:22:45 EDT Ventricular Rate:  60 PR Interval:    QRS Duration: 95 QT Interval:  410 QTC Calculation: 410 R Axis:   65 Text Interpretation:  Sinus rhythm Abnormal R-wave progression, early transition Baseline wander in lead(s) III aVL aVF V1 V2 No old tracing to compare Confirmed by Merrily Pew [phone]) on 03/24/2018 8:51:04 PM       ____________________________________________  RADIOLOGY  Ct Abdomen Pelvis W Contrast  Result Date: 03/24/2018 CLINICAL DATA:  Nausea for 1 week. On antibiotics. History  of hemorrhoid anal fissure surgery February 28, 2018. EXAM: CT ABDOMEN AND PELVIS WITH CONTRAST TECHNIQUE: Multidetector CT imaging of the abdomen and pelvis was performed using the standard protocol following bolus administration of intravenous contrast. CONTRAST:  149mL ISOVUE-300 IOPAMIDOL (ISOVUE-300) INJECTION 61% COMPARISON:  None. FINDINGS: LOWER CHEST: RIGHT lower lobe atelectasis/scarring. Included heart size is normal. No pericardial effusion. Mild gas distended distal esophagus associated with reflux. HEPATOBILIARY: Liver and gallbladder are normal. Mild focal fatty infiltration about the falciform ligament. PANCREAS: Normal. SPLEEN: Normal. ADRENALS/URINARY TRACT: Kidneys are orthotopic, demonstrating symmetric enhancement. No nephrolithiasis, hydronephrosis or solid renal masses. The unopacified ureters are normal in course and caliber. Delayed imaging through the kidneys demonstrates symmetric prompt contrast excretion within the proximal urinary collecting system. Urinary bladder is well distended and unremarkable. 11 mm LEFT adrenal nodule with greater than 50% washout compatible with benign adenoma. STOMACH/BOWEL: The stomach, small and large bowel are normal in course and caliber without  inflammatory changes. Mildly thickened noninflamed rectum consistent with history of internal hemorrhoids associated with diverticulum. Mild sigmoid colonic diverticulosis. Normal appendix. VASCULAR/LYMPHATIC: Aortoiliac vessels are normal in course and caliber. Minimal intimal thickening calcific atherosclerosis. No lymphadenopathy by CT size criteria. REPRODUCTIVE: Nonacute. 2.1 cm solidly enhancing LEFT mid uterine body intramural leiomyoma. OTHER: No intraperitoneal free fluid or free air. MUSCULOSKELETAL: Nonacute. Small LEFT proximal femur enchondroma. Grade 1 L4-5 anterolisthesis without spondylolysis. Severe L4-5 facet arthropathy. IMPRESSION: 1. No acute intra-abdominal or pelvic process. Electronically  Signed   By: Elon Alas M.D.   On: 03/24/2018 17:20    ____________________________________________   PROCEDURES  Procedure(s) performed:   Procedures   ____________________________________________   INITIAL IMPRESSION / ASSESSMENT AND PLAN / ED COURSE  Primary concern for this patient would be gallbladder or biliary colic.  Secondarily would be peptic ulcer disease, gastritis or esophagitis.  Also consider cardiac causes with her age and multiple risk factors.  Will start with labs, CT and EKG.  Will give GI cocktail and see how that helps but otherwise will treat symptomatically.  Gi cocktail totally resolved symptoms. Ct ok. Suspect gerd/gastritis/esophagtis. Will stasrt PPI and H2 blockers with pcp follow up.   Pertinent labs & imaging results that were available during my care of the patient were reviewed by me and considered in my medical decision making (see chart for details).  ____________________________________________  FINAL CLINICAL IMPRESSION(S) / ED DIAGNOSES  Final diagnoses:  Abdominal pain, unspecified abdominal location     MEDICATIONS GIVEN DURING THIS VISIT:  Medications  sucralfate (CARAFATE) 1 GM/10ML suspension 1 g (1 g Oral Given 03/24/18 1809)  pantoprazole (PROTONIX) EC tablet 40 mg (40 mg Oral Given 03/24/18 1808)  gi cocktail (Maalox,Lidocaine,Donnatal) (30 mLs Oral Given 03/24/18 1539)  iopamidol (ISOVUE-300) 61 % injection 100 mL (100 mLs Intravenous Contrast Given 03/24/18 1649)  famotidine (PEPCID) tablet 20 mg (20 mg Oral Given 03/24/18 1808)     NEW OUTPATIENT MEDICATIONS STARTED DURING THIS VISIT:  Discharge Medication List as of 03/24/2018  7:03 PM    START taking these medications   Details  famotidine (PEPCID) 20 MG tablet Take 1 tablet (20 mg total) by mouth 2 (two) times daily., Starting Sat 03/24/2018, Print    pantoprazole (PROTONIX) 20 MG tablet Take 1 tablet (20 mg total) by mouth daily., Starting Sat 03/24/2018, Print          Note:  This note was prepared with assistance of Dragon voice recognition software. Occasional wrong-word or sound-a-like substitutions may have occurred due to the inherent limitations of voice recognition software.   Merrily Pew, MD 03/24/18 2051

## 2018-05-23 DIAGNOSIS — I10 Essential (primary) hypertension: Secondary | ICD-10-CM | POA: Diagnosis not present

## 2018-05-23 DIAGNOSIS — R102 Pelvic and perineal pain: Secondary | ICD-10-CM | POA: Diagnosis not present

## 2018-05-23 DIAGNOSIS — E2839 Other primary ovarian failure: Secondary | ICD-10-CM | POA: Diagnosis not present

## 2018-05-23 DIAGNOSIS — N898 Other specified noninflammatory disorders of vagina: Secondary | ICD-10-CM | POA: Diagnosis not present

## 2018-05-23 DIAGNOSIS — Z780 Asymptomatic menopausal state: Secondary | ICD-10-CM | POA: Diagnosis not present

## 2018-08-23 DIAGNOSIS — E2839 Other primary ovarian failure: Secondary | ICD-10-CM | POA: Diagnosis not present

## 2018-08-23 DIAGNOSIS — I10 Essential (primary) hypertension: Secondary | ICD-10-CM | POA: Diagnosis not present

## 2018-08-23 DIAGNOSIS — N898 Other specified noninflammatory disorders of vagina: Secondary | ICD-10-CM | POA: Diagnosis not present

## 2018-08-23 DIAGNOSIS — E559 Vitamin D deficiency, unspecified: Secondary | ICD-10-CM | POA: Diagnosis not present

## 2018-08-23 DIAGNOSIS — Z780 Asymptomatic menopausal state: Secondary | ICD-10-CM | POA: Diagnosis not present

## 2018-08-23 DIAGNOSIS — M533 Sacrococcygeal disorders, not elsewhere classified: Secondary | ICD-10-CM | POA: Diagnosis not present

## 2018-08-23 DIAGNOSIS — Z23 Encounter for immunization: Secondary | ICD-10-CM | POA: Diagnosis not present

## 2018-09-13 ENCOUNTER — Telehealth: Payer: Self-pay | Admitting: Radiology

## 2018-09-13 ENCOUNTER — Ambulatory Visit (INDEPENDENT_AMBULATORY_CARE_PROVIDER_SITE_OTHER): Payer: Medicare Other | Admitting: Orthopaedic Surgery

## 2018-09-13 ENCOUNTER — Encounter: Payer: Self-pay | Admitting: Orthopaedic Surgery

## 2018-09-13 VITALS — BP 158/71 | HR 74 | Ht 64.0 in | Wt 166.0 lb

## 2018-09-13 DIAGNOSIS — M545 Low back pain: Secondary | ICD-10-CM

## 2018-09-13 DIAGNOSIS — G8929 Other chronic pain: Secondary | ICD-10-CM | POA: Diagnosis not present

## 2018-09-13 DIAGNOSIS — M5136 Other intervertebral disc degeneration, lumbar region: Secondary | ICD-10-CM

## 2018-09-13 MED ORDER — NAPROXEN 500 MG PO TABS: 500.0000 mg | ORAL_TABLET | Freq: Two times a day (BID) | ORAL | 5 refills | Status: DC
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
## 2018-09-13 NOTE — Progress Notes (Signed)
Subjective:    Patient ID: Michele Duncan, female    DOB: 1951-06-01, 67 y.o.   MRN: [id number]  HPI She has had pain of the lower back for over a year now.  She had x-rays done in November 2018 showing: IMPRESSION: Stable degenerative changes with anterolisthesis of L4 on L5 of a degenerative nature.  Changes suggestive of right renal stones.  She has been followed by Dr. Anastasio Champion and I have reviewed his notes.  She continues to have lower back pain.  She has no trauma, no weakness.  Nothing seems to help.  She is tired of hurting.  I will get MRI of the lumbar spine.    Review of Systems  Constitutional: Positive for activity change.  Musculoskeletal: Positive for arthralgias and back pain.  Psychiatric/Behavioral: The patient is nervous/anxious.   All other systems reviewed and are negative.  For Review of Systems, all other systems reviewed and are negative.  The following is a summary of the past history medically, past history surgically, known current medicines, social history and family history.  This information is gathered electronically by the computer from prior information and documentation.  I review this each visit and have found including this information at this point in the chart is beneficial and informative.   Past Medical History:  Diagnosis Date  . Anxiety   . Arthritis    low back  . Chronic anal fissure   . Chronic low back pain   . Grade II internal hemorrhoids   . Hemorrhoids, external   . History of adenomatous polyp of colon    11-06-2017  . HTN (hypertension)   . Wears glasses     Past Surgical History:  Procedure Laterality Date  . BREAST BIOPSY Left 1970s   benign   . COLONOSCOPY N/A 11/06/2017   Procedure: COLONOSCOPY;  Surgeon: Danie Binder, MD;  [facility];  Service: Endoscopy;  Laterality: N/A;  1:45pm  . HEMORRHOID SURGERY N/A 02/28/2018   Procedure: HEMORRHOIDECTOMY;  Surgeon: Michael Boston, MD;  Location: [facility];  Service: General;  Laterality: N/A;  . SPHINCTEROTOMY N/A 02/28/2018   Procedure: LATERAL SPHINCTEROTOMY;  Surgeon: Michael Boston, MD;  Location: [place];  Service: General;  Laterality: N/A;    Current Outpatient Medications on File Prior to Visit  Medication Sig Dispense Refill  . Cholecalciferol (VITAMIN D-3 PO) Take 7,000 Int'l Units by mouth daily.    Marland Kitchen estradiol (ESTRACE) 0.5 MG tablet Take 0.5 mg by mouth daily.  3  . losartan-hydrochlorothiazide (HYZAAR) 50-12.5 MG tablet Take 1 tablet by mouth every morning.     . NP THYROID 60 MG tablet Take 60 mg by mouth daily.  3  . pantoprazole (PROTONIX) 20 MG tablet Take 1 tablet (20 mg total) by mouth daily. 15 tablet 0  . potassium chloride SA (K-DUR,KLOR-CON) 20 MEQ tablet Take 40 mEq by mouth every morning.     . progesterone (PROMETRIUM) 200 MG capsule TAKE 1 CAPSULE BY MOUTH EVERY NIGHT  3  . acetaminophen (TYLENOL) 650 MG CR tablet Take 650 mg by mouth every 8 (eight) hours as needed for pain.    Marland Kitchen aluminum hydroxide-magnesium carbonate (GAVISCON) 95-358 MG/15ML SUSP Take 15 mLs by mouth as needed for indigestion.    . Ca Carbonate-Mag Hydroxide (ROLAIDS) 550-110 MG CHEW Chew 1-2 each by mouth daily as needed (for indigestion).    Marland Kitchen diltiazem 2 % GEL Apply 1 application topically 2 (two) times  daily as needed.    Marland Kitchen oxyCODONE (OXY IR/ROXICODONE) 5 MG immediate release tablet Take 1-2 tablets (5-10 mg total) by mouth every 6 (six) hours as needed for moderate pain, severe pain or breakthrough pain. (Patient not taking: Reported on 09/13/2018) 30 tablet 0   No current facility-administered medications on file prior to visit.     Social History   Socioeconomic History  . Marital status: Married    Spouse name: Not on file  . Number of children: Not on file  . Years of education: Not on file  . Highest education level: Not on file  Occupational History  . Not on file  Social Needs  .  Financial resource strain: Not on file  . Food insecurity:    Worry: Not on file    Inability: Not on file  . Transportation needs:    Medical: Not on file    Non-medical: Not on file  Tobacco Use  . Smoking status: Former Smoker    Types: Cigarettes    Last attempt to quit: 12/05/2013    Years since quitting: 4.7  . Smokeless tobacco: Never Used  Substance and Sexual Activity  . Alcohol use: No    Frequency: Never  . Drug use: No  . Sexual activity: Yes  Lifestyle  . Physical activity:    Days per week: Not on file    Minutes per session: Not on file  . Stress: Not on file  Relationships  . Social connections:    Talks on phone: Not on file    Gets together: Not on file    Attends religious service: Not on file    Active member of club or organization: Not on file    Attends meetings of clubs or organizations: Not on file    Relationship status: Not on file  . Intimate partner violence:    Fear of current or ex partner: Not on file    Emotionally abused: Not on file    Physically abused: Not on file    Forced sexual activity: Not on file  Other Topics Concern  . 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     Family History  Problem Relation Age of Onset  . Colon cancer Paternal Grandmother   . Cancer Mother   . Hypertension Father   . Heart disease Father   . Autoimmune disease Sister   . Lung disease Sister   . Colon polyps Neg Hx     BP (!) 158/71   Pulse 74   Ht 5\' 4"  (1.626 m)   Wt 166 lb (75.3 kg)   BMI 28.49 kg/m   Body mass index is 28.49 kg/m.      Objective:   Physical Exam  Constitutional: She is oriented to person, place, and time. She appears well-developed and well-nourished.  HENT:  Head: Normocephalic and atraumatic.  Eyes: Pupils are equal, round, and reactive to light. Conjunctivae and EOM are normal.  Neck: Normal range of motion. Neck supple.  Cardiovascular: Normal rate, regular  rhythm and intact distal pulses.  Pulmonary/Chest: Effort normal.  Abdominal: Soft.  Musculoskeletal:       Back:  Neurological: She is alert and oriented to person, place, and time. She has normal reflexes. She displays normal reflexes. No cranial nerve deficit. She exhibits normal muscle tone. Coordination normal.  Skin: Skin is warm and dry.  Psychiatric: She has a normal mood and affect.  Her behavior is normal. Judgment and thought content normal.          Assessment & Plan:   Encounter Diagnosis  Name Primary?  . Chronic midline low back pain without sciatica Yes   I will get MRI of the lumbar spine.  Return after the MRI.  Call if any problem.  Precautions discussed.   Electronically Signed Sanjuana Kava, MD 10/10/201911:25 AM

## 2018-09-13 NOTE — Telephone Encounter (Signed)
Called to schedule MRI Michele Duncan on Tuesday 15th October 2pm, arrive at 1:30   When I called her back she told me she is claustrophobic, I have reordered the scan and given her the number to call and schedule

## 2018-09-13 NOTE — Telephone Encounter (Signed)
I called central scheduling to cancel the MRI at [facility], they told me it was already cancelled, but when patient called [facility] they told her it was still scheduled. I have cancelled it myself and told patient to call and schedule at [place]

## 2018-09-18 ENCOUNTER — Ambulatory Visit (HOSPITAL_COMMUNITY): Payer: Self-pay

## 2018-09-23 ENCOUNTER — Ambulatory Visit
Admission: RE | Admit: 2018-09-23 | Discharge: 2018-09-23 | Disposition: A | Discharge status: Home / Self Care | Payer: Self-pay | Source: Ambulatory Visit | Attending: Orthopaedic Surgery | Admitting: Orthopaedic Surgery

## 2018-09-23 DIAGNOSIS — M5136 Other intervertebral disc degeneration, lumbar region: Secondary | ICD-10-CM

## 2018-09-23 DIAGNOSIS — M48061 Spinal stenosis, lumbar region without neurogenic claudication: Secondary | ICD-10-CM | POA: Diagnosis not present

## 2018-10-03 ENCOUNTER — Ambulatory Visit (INDEPENDENT_AMBULATORY_CARE_PROVIDER_SITE_OTHER): Payer: Medicare Other | Admitting: Orthopaedic Surgery

## 2018-10-03 ENCOUNTER — Encounter: Payer: Self-pay | Admitting: Orthopaedic Surgery

## 2018-10-03 VITALS — BP 142/87 | HR 85 | Ht 64.0 in | Wt 166.0 lb

## 2018-10-03 DIAGNOSIS — M5136 Other intervertebral disc degeneration, lumbar region: Secondary | ICD-10-CM | POA: Diagnosis not present

## 2018-10-03 DIAGNOSIS — G8929 Other chronic pain: Secondary | ICD-10-CM | POA: Diagnosis not present

## 2018-10-03 DIAGNOSIS — M545 Low back pain: Secondary | ICD-10-CM | POA: Diagnosis not present

## 2018-10-03 NOTE — Progress Notes (Signed)
Patient Michele Duncan, female DOB:Sep 01, 1951, 67 y.o. TXM:[id number]  Chief Complaint  Patient presents with  . Back Pain    MRI results    HPI  Michele Duncan is a 67 y.o. female who has continued lower back pain that has gotten worse with the cooler weather.  She had a MRI which showed: IMPRESSION: 1. Severe L4-5 facet arthrosis with grade 1 anterolisthesis and mild spinal canal stenosis. 2. Otherwise mild degenerative disc disease without stenosis.  I have explained the findings to her and recommended an epidural injection series.  I will set this up.  She is agreeable to this.   Body mass index is 28.49 kg/m.  ROS  Review of Systems  Constitutional: Positive for activity change.  Musculoskeletal: Positive for arthralgias and back pain.  Psychiatric/Behavioral: The patient is nervous/anxious.   All other systems reviewed and are negative.   All other systems reviewed and are negative.  The following is a summary of the past history medically, past history surgically, known current medicines, social history and family history.  This information is gathered electronically by the computer from prior information and documentation.  I review this each visit and have found including this information at this point in the chart is beneficial and informative.    Past Medical History:  Diagnosis Date  . Anxiety   . Arthritis    low back  . Chronic anal fissure   . Chronic low back pain   . Grade II internal hemorrhoids   . Hemorrhoids, external   . History of adenomatous polyp of colon    11-06-2017  . HTN (hypertension)   . Wears glasses     Past Surgical History:  Procedure Laterality Date  . BREAST BIOPSY Left 1970s   benign   . COLONOSCOPY N/A 11/06/2017   Procedure: COLONOSCOPY;  Surgeon: Danie Binder, MD;  [facility];  Service: Endoscopy;  Laterality: N/A;  1:45pm  . HEMORRHOID SURGERY N/A 02/28/2018   Procedure: HEMORRHOIDECTOMY;  Surgeon: Michael Boston, MD;  Location: [facility];  Service: General;  Laterality: N/A;  . SPHINCTEROTOMY N/A 02/28/2018   Procedure: LATERAL SPHINCTEROTOMY;  Surgeon: Michael Boston, MD;  Location: [place];  Service: General;  Laterality: N/A;    Family History  Problem Relation Age of Onset  . Colon cancer Paternal Grandmother   . Cancer Mother   . Hypertension Father   . Heart disease Father   . Autoimmune disease Sister   . Lung disease Sister   . Colon polyps Neg Hx     Social History Social History   Tobacco Use  . Smoking status: Former Smoker    Types: Cigarettes    Last attempt to quit: 12/05/2013    Years since quitting: 4.8  . Smokeless tobacco: Never Used  Substance Use Topics  . Alcohol use: No    Frequency: Never  . Drug use: No    Allergies  Allergen Reactions  . Penicillins Shortness Of Breath and Itching    Has patient had a PCN reaction causing immediate rash, facial/tongue/throat swelling, SOB or lightheadedness with hypotension: Yes Has patient had a PCN reaction causing severe rash involving mucus membranes or skin necrosis: No Has patient had a PCN reaction that required hospitalization: No Has patient had a PCN reaction occurring within the last 10 years: No If all of the above answers are "NO", then may proceed with Cephalosporin use.   . Codeine Itching  Current Outpatient Medications  Medication Sig Dispense Refill  . acetaminophen (TYLENOL) 650 MG CR tablet Take 650 mg by mouth every 8 (eight) hours as needed for pain.    Marland Kitchen aluminum hydroxide-magnesium carbonate (GAVISCON) 95-358 MG/15ML SUSP Take 15 mLs by mouth as needed for indigestion.    . Ca Carbonate-Mag Hydroxide (ROLAIDS) 550-110 MG CHEW Chew 1-2 each by mouth daily as needed (for indigestion).    . Cholecalciferol (VITAMIN D-3 PO) Take 7,000 Int'l Units by mouth daily.    Marland Kitchen diltiazem 2 % GEL Apply 1 application topically 2 (two) times daily as needed.    Marland Kitchen  estradiol (ESTRACE) 0.5 MG tablet Take 0.5 mg by mouth daily.  3  . losartan-hydrochlorothiazide (HYZAAR) 50-12.5 MG tablet Take 1 tablet by mouth every morning.     . naproxen (NAPROSYN) 500 MG tablet Take 1 tablet (500 mg total) by mouth 2 (two) times daily with a meal. 60 tablet 5  . NP THYROID 60 MG tablet Take 60 mg by mouth daily.  3  . oxyCODONE (OXY IR/ROXICODONE) 5 MG immediate release tablet Take 1-2 tablets (5-10 mg total) by mouth every 6 (six) hours as needed for moderate pain, severe pain or breakthrough pain. (Patient not taking: Reported on 09/13/2018) 30 tablet 0  . pantoprazole (PROTONIX) 20 MG tablet Take 1 tablet (20 mg total) by mouth daily. 15 tablet 0  . potassium chloride SA (K-DUR,KLOR-CON) 20 MEQ tablet Take 40 mEq by mouth every morning.     . progesterone (PROMETRIUM) 200 MG capsule TAKE 1 CAPSULE BY MOUTH EVERY NIGHT  3   No current facility-administered medications for this visit.      Physical Exam  Blood pressure (!) 142/87, pulse 85, height 5\' 4"  (1.626 m), weight 166 lb (75.3 kg).  Constitutional: overall normal hygiene, normal nutrition, well developed, normal grooming, normal body habitus. Assistive device:none  Musculoskeletal: gait and station Limp none, muscle tone and strength are normal, no tremors or atrophy is present.  .  Neurological: coordination overall normal.  Deep tendon reflex/nerve stretch intact.  Sensation normal.  Cranial nerves II-XII intact.   Skin:   Normal overall no scars, lesions, ulcers or rashes. No psoriasis.  Psychiatric: Alert and oriented x 3.  Recent memory intact, remote memory unclear.  Normal mood and affect. Well groomed.  Good eye contact.  Cardiovascular: overall no swelling, no varicosities, no edema bilaterally, normal temperatures of the legs and arms, no clubbing, cyanosis and good capillary refill.  Lymphatic: palpation is normal.  Spine/Pelvis examination:  Inspection:  Overall, sacoiliac joint benign and  hips nontender; without crepitus or defects.   Thoracic spine inspection: Alignment normal without kyphosis present   Lumbar spine inspection:  Alignment  with normal lumbar lordosis, without scoliosis apparent.   Thoracic spine palpation:  without tenderness of spinal processes   Lumbar spine palpation: without tenderness of lumbar area; without tightness of lumbar muscles    Range of Motion:   Lumbar flexion, forward flexion is normal without pain or tenderness    Lumbar extension is full without pain or tenderness   Left lateral bend is normal without pain or tenderness   Right lateral bend is normal without pain or tenderness   Straight leg raising is normal  Strength & tone: normal   Stability overall normal stability All other systems reviewed and are negative   The patient has been educated about the nature of the problem(s) and counseled on treatment options.  The patient appeared to understand  what I have discussed and is in agreement with it.  Encounter Diagnoses  Name Primary?  . Other intervertebral disc degeneration, lumbar region Yes  . Chronic midline low back pain without sciatica     PLAN Call if any problems.  Precautions discussed.  Continue current medications.   Return to clinic 1 month   To set up epidural.  Electronically Signed Sanjuana Kava, MD 10/30/201910:17 AM

## 2018-10-16 ENCOUNTER — Ambulatory Visit (INDEPENDENT_AMBULATORY_CARE_PROVIDER_SITE_OTHER): Payer: Medicare Other | Admitting: Physical Medicine and Rehabilitation

## 2018-10-16 ENCOUNTER — Ambulatory Visit (INDEPENDENT_AMBULATORY_CARE_PROVIDER_SITE_OTHER): Payer: Self-pay

## 2018-10-16 ENCOUNTER — Encounter (INDEPENDENT_AMBULATORY_CARE_PROVIDER_SITE_OTHER): Payer: Self-pay | Admitting: Physical Medicine and Rehabilitation

## 2018-10-16 VITALS — BP 157/94 | HR 81 | Temp 98.6°F | Ht 64.0 in | Wt 166.0 lb

## 2018-10-16 DIAGNOSIS — G8929 Other chronic pain: Secondary | ICD-10-CM

## 2018-10-16 DIAGNOSIS — M47816 Spondylosis without myelopathy or radiculopathy, lumbar region: Secondary | ICD-10-CM | POA: Diagnosis not present

## 2018-10-16 DIAGNOSIS — M545 Low back pain: Secondary | ICD-10-CM

## 2018-10-16 DIAGNOSIS — M25511 Pain in right shoulder: Secondary | ICD-10-CM

## 2018-10-16 DIAGNOSIS — M25512 Pain in left shoulder: Secondary | ICD-10-CM | POA: Diagnosis not present

## 2018-10-16 DIAGNOSIS — M533 Sacrococcygeal disorders, not elsewhere classified: Secondary | ICD-10-CM

## 2018-10-16 MED ORDER — METHYLPREDNISOLONE ACETATE 80 MG/ML IJ SUSP
80.0000 mg | Freq: Once | INTRAMUSCULAR | Status: AC
  Administered 2018-10-16: 80 mg

## 2018-10-16 NOTE — Progress Notes (Signed)
 .  Numeric Pain Rating Scale and Functional Assessment Average Pain 10 Pain Right Now 6 My pain is constant and dull Pain is worse with: standing Pain improves with: rest   In the last MONTH (on 0-10 scale) has pain interfered with the following?  1. General activity like being  able to carry out your everyday physical activities such as walking, climbing stairs, carrying groceries, or moving a chair?  Rating(8)  2. Relation with others like being able to carry out your usual social activities and roles such as  activities at home, at work and in your community. Rating(8)  3. Enjoyment of life such that you have  been bothered by emotional problems such as feeling anxious, depressed or irritable?  Rating(6)  +Driver, -BT, -Dye Allergies

## 2018-10-16 NOTE — Progress Notes (Signed)
Michele Duncan - 67 y.o. female MRN [id number]  Date of birth: 1951/02/11  Office Visit Note: Visit Date: 10/16/2018 PCP: [name], MD Referred by: [name], MD  Subjective: Chief Complaint  Patient presents with  . Right Shoulder - Pain  . Left Shoulder - Pain  . Middle Back - Pain  . Lower Back - Pain   HPI: Michele Duncan is a 67 y.o. female who comes in today At the request of Dr. Sanjuana Kava for evaluation management of her low back pain and possible lumbar interventional spine procedure.  She began seeing Dr. Luna Glasgow in the early part of October after failure of conservative care of her back pain with her primary care physician.  She is currently on oxycodone for pain management.  Despite this she reports her average pain is 10 out of 10 this is constant dull worse with standing and better at rest.  She actually reports to me pain from the shoulders that radiated into the lower back and buttocks.  She reports this started 6 months ago without specific trauma.  She has 2 types of pain one is with standing and for standing for a long.  The other is she cannot sit because she is getting pain really on the coccyx area.  She has to constantly move back and forth.  She does have a history of pelvic pain with history of hemorrhoidectomy and chronic anal fissure.  Dr. Luna Glasgow felt her back pain was mechanical and x-rays revealed grade 1 listhesis of L4 on L5.  He did obtain MRI of the lumbar spine which is reviewed with the patient today.  I reviewed this with her and the spine models.  She has not had specific physical therapy for pelvic pain or lumbar spine.  Review of Systems  Constitutional: Negative for chills, fever, malaise/fatigue and weight loss.  HENT: Negative for hearing loss and sinus pain.   Eyes: Negative for blurred vision, double vision and photophobia.  Respiratory: Negative for cough and shortness of breath.   Cardiovascular: Negative for chest pain,  palpitations and leg swelling.  Gastrointestinal: Negative for abdominal pain, nausea and vomiting.  Genitourinary: Negative for flank pain.  Musculoskeletal: Positive for back pain and joint pain. Negative for myalgias.  Skin: Negative for itching and rash.  Neurological: Negative for tremors, focal weakness and weakness.  Endo/Heme/Allergies: Negative.   Psychiatric/Behavioral: Negative for depression.  All other systems reviewed and are negative.  Otherwise per HPI.  Assessment & Plan: Visit Diagnoses:  1. Coccydynia   2. Spondylosis without myelopathy or radiculopathy, lumbar region   3. Chronic bilateral low back pain without sciatica   4. Chronic pain of both shoulders     Plan: Findings:  Chronic worsening axial low back pain which I feel like is a combination of facet joint arthritic pain at L4-5 as well as coccydynia may be from more of a chronic source given her prior pelvic history.  Could be getting some level of pain from mild stenosis at L4-5 which with reviewing the images seems to be a little worse than dictated in the MRI report.  She reports today her biggest complaint really is the coccydynia.  We can try coccyx injection with fluoroscopic guidance to see if that helps and if it is very beneficial that she might be a good candidate for physical therapy at [facility] physical therapy and [place] that does specialize and pelvic pain physical therapy.  If she does not get  much relief then I would look at either facet joint block or epidural injection of the lumbar spine.  As of note for Dr. Luna Glasgow these are not really done as a series of 3 which is really fallen out of favor since we do direct these with fluoroscopic guidance and the diagnostic tool.  Obviously if an injection seems to help to a degree sometimes are to repeat that particularly with the epidural injection.  With facet joint problems we can look at radiofrequency ablation which is a more longer term solution.  I  do not feel from a spine standpoint that her anatomic pathology would warrant chronic opioid therapy but I would leave that to the other managing physicians.  We are not doing chronic opioid therapy in the office.    Meds & Orders:  Meds ordered this encounter  Medications  . methylPREDNISolone acetate (DEPO-MEDROL) injection 80 mg    Orders Placed This Encounter  Procedures  . Large Joint Inj, Coccyx  . XR C-ARM NO REPORT    Follow-up: Return if symptoms worsen or fail to improve.   Procedures: Large Joint Inj, Coccyx (Coccyx) on 10/16/2018 3:39 PM Details: 22 G 3.5 in needle, fluoroscopy-guided posterior approach  Arthrogram: No  Medications: 80 mg methylPREDNISolone acetate 80 MG/ML; 2 mL bupivacaine 0.5 % Outcome: tolerated well, no immediate complications Procedure, treatment alternatives, risks and benefits explained, specific risks discussed. Consent was given by the parent. Immediately prior to procedure a time out was called to verify the correct patient, procedure, equipment, support staff and site/side marked as required. Patient was prepped and draped in the usual sterile fashion.      No notes on file   Clinical History: MRI LUMBAR SPINE WITHOUT CONTRAST  TECHNIQUE: Multiplanar, multisequence MR imaging of the lumbar spine was performed. No intravenous contrast was administered.  COMPARISON:  None.  FINDINGS: Segmentation: Normal. The lowest disc space is considered to be L5-S1.  Alignment:  Grade 1 L4-5 anterolisthesis.  Vertebrae: No acute compression fracture, discitis-osteomyelitis of focal marrow lesion.  Conus medullaris and cauda equina: The conus medullaris terminates at the L1 level. The cauda equina and conus medullaris are both normal.  Paraspinal and other soft tissues: The visualized aorta, IVC and iliac vessels are normal. The visualized retroperitoneal organs and paraspinal soft tissues are normal.  Disc levels: Sagittal plane  imaging includes the T10-11 disc level through the upper sacrum, with axial imaging of the T12-L1 to L5-S1 disc levels.  T10-11 and T11-12 are normal.  T12-L1: Normal.  L1-L2: Normal.  L2-L3: Mild disc bulge without spinal canal or neural foraminal stenosis.  L3-L4: Small disc bulge. No spinal canal or neural foraminal stenosis. Normal facets.  L4-L5: Severe facet hypertrophy causing grade 1 anterolisthesis. There is mild widening of the right facet. Fluid in both facet joints. There is mild spinal canal stenosis. No neural foraminal stenosis.  L5-S1: Normal.  The visualized portion of the sacrum is normal.  IMPRESSION: 1. Severe L4-5 facet arthrosis with grade 1 anterolisthesis and mild spinal canal stenosis. 2. Otherwise mild degenerative disc disease without stenosis.   Electronically Signed   By: Ulyses Jarred M.D.   On: 09/24/2018 01:21   She reports that she quit smoking about 4 years ago. Her smoking use included cigarettes. She has never used smokeless tobacco. No results for input(s): HGBA1C, LABURIC in the last 8760 hours.  Objective:  VS:  HT:5\' 4"  (162.6 cm)   WT:166 lb (75.3 kg)  BMI:28.48    BP:(!)  157/94  HR:81bpm  TEMP:98.6 F (37 C)(Oral)  RESP:  Physical Exam  Constitutional: She is oriented to person, place, and time. She appears well-developed and well-nourished. No distress.  HENT:  Head: Normocephalic and atraumatic.  Nose: Nose normal.  Mouth/Throat: Oropharynx is clear and moist.  Eyes: Pupils are equal, round, and reactive to light. Conjunctivae are normal.  Neck: Normal range of motion. Neck supple.  Cardiovascular: Regular rhythm and intact distal pulses.  Pulmonary/Chest: Effort normal. No respiratory distress.  Abdominal: She exhibits no distension. There is no guarding.  Neurological: She is alert and oriented to person, place, and time. She exhibits normal muscle tone. Coordination normal.  Skin: Skin is warm. No rash  noted. No erythema.  Psychiatric: She has a normal mood and affect. Her behavior is normal.  Nursing note and vitals reviewed.   Ortho Exam Imaging: Xr C-arm No Report  Result Date: 11/12/2018 Please see Notes tab for imaging impression.   Past Medical/Family/Surgical/Social History: Medications & Allergies reviewed per EMR, new medications updated. Patient Active Problem List   Diagnosis Date Noted  . Chronic posterior anal fissure 02/28/2018  . External hemorrhoids with pain 02/28/2018  . Rectal bleeding 11/02/2017   Past Medical History:  Diagnosis Date  . Anxiety   . Arthritis    low back  . Chronic anal fissure   . Chronic low back pain   . Grade II internal hemorrhoids   . Hemorrhoids, external   . History of adenomatous polyp of colon    11-06-2017  . HTN (hypertension)   . Wears glasses    Family History  Problem Relation Age of Onset  . Colon cancer Paternal Grandmother   . Cancer Mother   . Hypertension Father   . Heart disease Father   . Autoimmune disease Sister   . Lung disease Sister   . Colon polyps Neg Hx    Past Surgical History:  Procedure Laterality Date  . BREAST BIOPSY Left 1970s   benign   . COLONOSCOPY N/A 11/06/2017   Procedure: COLONOSCOPY;  Surgeon: Danie Binder, MD;  [facility];  Service: Endoscopy;  Laterality: N/A;  1:45pm  . HEMORRHOID SURGERY N/A 02/28/2018   Procedure: HEMORRHOIDECTOMY;  Surgeon: Michael Boston, MD;  Location[facility];  Service: General;  Laterality: N/A;  . SPHINCTEROTOMY N/A 02/28/2018   Procedure: LATERAL SPHINCTEROTOMY;  Surgeon: Michael Boston, MD;  Location: [place];  Service: General;  Laterality: N/A;   Social History   Occupational History  . Not on file  Tobacco Use  . Smoking status: Former Smoker    Types: Cigarettes    Last attempt to quit: 12/05/2013    Years since quitting: 4.9  . Smokeless tobacco: Never Used  Substance and Sexual Activity  .  Alcohol use: No    Frequency: Never  . Drug use: No  . Sexual activity: Yes

## 2018-10-16 NOTE — Patient Instructions (Signed)

## 2018-10-30 ENCOUNTER — Encounter (INDEPENDENT_AMBULATORY_CARE_PROVIDER_SITE_OTHER): Payer: Medicare Other | Admitting: Physical Medicine and Rehabilitation

## 2018-11-08 ENCOUNTER — Ambulatory Visit (INDEPENDENT_AMBULATORY_CARE_PROVIDER_SITE_OTHER): Payer: Medicare Other | Admitting: Orthopaedic Surgery

## 2018-11-08 ENCOUNTER — Encounter: Payer: Self-pay | Admitting: Orthopaedic Surgery

## 2018-11-08 VITALS — BP 140/90 | HR 78 | Ht 64.0 in | Wt 165.0 lb

## 2018-11-08 DIAGNOSIS — M545 Low back pain: Secondary | ICD-10-CM | POA: Diagnosis not present

## 2018-11-08 DIAGNOSIS — M5136 Other intervertebral disc degeneration, lumbar region: Secondary | ICD-10-CM

## 2018-11-08 DIAGNOSIS — G8929 Other chronic pain: Secondary | ICD-10-CM | POA: Diagnosis not present

## 2018-11-08 NOTE — Progress Notes (Signed)
Patient Michele Duncan, female DOB:03-23-1951, 67 y.o. HWE:[id number]  Chief Complaint  Patient presents with  . Back Pain    Chronic low back pain.    HPI  Michele Duncan is a 67 y.o. female who has continued lower back pain.  She had one epidural injection that did not help that much.  She is scheduled for another one next Monday.  She has no sciatica or trauma.  Her husband has stage 4 cancer and she is taking him to chemotherapy often.   Body mass index is 28.32 kg/m.  ROS  Review of Systems  Constitutional: Positive for activity change.  Musculoskeletal: Positive for arthralgias and back pain.  Psychiatric/Behavioral: The patient is nervous/anxious.   All other systems reviewed and are negative.   All other systems reviewed and are negative.  The following is a summary of the past history medically, past history surgically, known current medicines, social history and family history.  This information is gathered electronically by the computer from prior information and documentation.  I review this each visit and have found including this information at this point in the chart is beneficial and informative.    Past Medical History:  Diagnosis Date  . Anxiety   . Arthritis    low back  . Chronic anal fissure   . Chronic low back pain   . Grade II internal hemorrhoids   . Hemorrhoids, external   . History of adenomatous polyp of colon    11-06-2017  . HTN (hypertension)   . Wears glasses     Past Surgical History:  Procedure Laterality Date  . BREAST BIOPSY Left 1970s   benign   . COLONOSCOPY N/A 11/06/2017   Procedure: COLONOSCOPY;  Surgeon: Danie Binder, MD;  [facility];  Service: Endoscopy;  Laterality: N/A;  1:45pm  . HEMORRHOID SURGERY N/A 02/28/2018   Procedure: HEMORRHOIDECTOMY;  Surgeon: Michael Boston, MD;  Location: [facility];  Service: General;  Laterality: N/A;  . SPHINCTEROTOMY N/A 02/28/2018   Procedure: LATERAL  SPHINCTEROTOMY;  Surgeon: Michael Boston, MD;  Location: [place];  Service: General;  Laterality: N/A;    Family History  Problem Relation Age of Onset  . Colon cancer Paternal Grandmother   . Cancer Mother   . Hypertension Father   . Heart disease Father   . Autoimmune disease Sister   . Lung disease Sister   . Colon polyps Neg Hx     Social History Social History   Tobacco Use  . Smoking status: Former Smoker    Types: Cigarettes    Last attempt to quit: 12/05/2013    Years since quitting: 4.9  . Smokeless tobacco: Never Used  Substance Use Topics  . Alcohol use: No    Frequency: Never  . Drug use: No    Allergies  Allergen Reactions  . Penicillins Shortness Of Breath and Itching    Has patient had a PCN reaction causing immediate rash, facial/tongue/throat swelling, SOB or lightheadedness with hypotension: Yes Has patient had a PCN reaction causing severe rash involving mucus membranes or skin necrosis: No Has patient had a PCN reaction that required hospitalization: No Has patient had a PCN reaction occurring within the last 10 years: No If all of the above answers are "NO", then may proceed with Cephalosporin use.   . Codeine Itching    Current Outpatient Medications  Medication Sig Dispense Refill  . acetaminophen (TYLENOL) 650 MG CR tablet Take 650 mg  by mouth every 8 (eight) hours as needed for pain.    Marland Kitchen aluminum hydroxide-magnesium carbonate (GAVISCON) 95-358 MG/15ML SUSP Take 15 mLs by mouth as needed for indigestion.    . Ca Carbonate-Mag Hydroxide (ROLAIDS) 550-110 MG CHEW Chew 1-2 each by mouth daily as needed (for indigestion).    . Cholecalciferol (VITAMIN D-3 PO) Take 7,000 Int'l Units by mouth daily.    Marland Kitchen diltiazem 2 % GEL Apply 1 application topically 2 (two) times daily as needed.    Marland Kitchen estradiol (ESTRACE) 0.5 MG tablet Take 0.5 mg by mouth daily.  3  . losartan-hydrochlorothiazide (HYZAAR) 50-12.5 MG tablet Take 1 tablet by mouth  every morning.     . naproxen (NAPROSYN) 500 MG tablet Take 1 tablet (500 mg total) by mouth 2 (two) times daily with a meal. 60 tablet 5  . NP THYROID 60 MG tablet Take 60 mg by mouth daily.  3  . oxyCODONE (OXY IR/ROXICODONE) 5 MG immediate release tablet Take 1-2 tablets (5-10 mg total) by mouth every 6 (six) hours as needed for moderate pain, severe pain or breakthrough pain. (Patient not taking: Reported on 09/13/2018) 30 tablet 0  . pantoprazole (PROTONIX) 20 MG tablet Take 1 tablet (20 mg total) by mouth daily. 15 tablet 0  . potassium chloride SA (K-DUR,KLOR-CON) 20 MEQ tablet Take 40 mEq by mouth every morning.     . progesterone (PROMETRIUM) 200 MG capsule TAKE 1 CAPSULE BY MOUTH EVERY NIGHT  3   No current facility-administered medications for this visit.      Physical Exam  Blood pressure 140/90, pulse 78, height 5\' 4"  (1.626 m), weight 165 lb (74.8 kg).  Constitutional: overall normal hygiene, normal nutrition, well developed, normal grooming, normal body habitus. Assistive device:none  Musculoskeletal: gait and station Limp none, muscle tone and strength are normal, no tremors or atrophy is present.  .  Neurological: coordination overall normal.  Deep tendon reflex/nerve stretch intact.  Sensation normal.  Cranial nerves II-XII intact.   Skin:   Normal overall no scars, lesions, ulcers or rashes. No psoriasis.  Psychiatric: Alert and oriented x 3.  Recent memory intact, remote memory unclear.  Normal mood and affect. Well groomed.  Good eye contact.  Cardiovascular: overall no swelling, no varicosities, no edema bilaterally, normal temperatures of the legs and arms, no clubbing, cyanosis and good capillary refill.  Lymphatic: palpation is normal.  Spine/Pelvis examination:  Inspection:  Overall, sacoiliac joint benign and hips nontender; without crepitus or defects.   Thoracic spine inspection: Alignment normal without kyphosis present   Lumbar spine inspection:   Alignment  with normal lumbar lordosis, without scoliosis apparent.   Thoracic spine palpation:  without tenderness of spinal processes   Lumbar spine palpation: without tenderness of lumbar area; without tightness of lumbar muscles    Range of Motion:   Lumbar flexion, forward flexion is normal without pain or tenderness    Lumbar extension is full without pain or tenderness   Left lateral bend is normal without pain or tenderness   Right lateral bend is normal without pain or tenderness   Straight leg raising is normal  Strength & tone: normal   Stability overall normal stability  All other systems reviewed and are negative   The patient has been educated about the nature of the problem(s) and counseled on treatment options.  The patient appeared to understand what I have discussed and is in agreement with it.  Encounter Diagnoses  Name Primary?  . Other  intervertebral disc degeneration, lumbar region Yes  . Chronic midline low back pain without sciatica     PLAN Call if any problems.  Precautions discussed.  Continue current medications.   Return to clinic 1 month   Electronically Signed Sanjuana Kava, MD 12/5/20199:59 AM

## 2018-11-12 ENCOUNTER — Ambulatory Visit (INDEPENDENT_AMBULATORY_CARE_PROVIDER_SITE_OTHER): Payer: Self-pay

## 2018-11-12 ENCOUNTER — Ambulatory Visit (INDEPENDENT_AMBULATORY_CARE_PROVIDER_SITE_OTHER): Payer: Medicare Other | Admitting: Physical Medicine and Rehabilitation

## 2018-11-12 ENCOUNTER — Encounter (INDEPENDENT_AMBULATORY_CARE_PROVIDER_SITE_OTHER): Payer: Self-pay | Admitting: Physical Medicine and Rehabilitation

## 2018-11-12 VITALS — BP 161/84 | HR 79 | Temp 98.4°F

## 2018-11-12 DIAGNOSIS — M5416 Radiculopathy, lumbar region: Secondary | ICD-10-CM

## 2018-11-12 DIAGNOSIS — M47816 Spondylosis without myelopathy or radiculopathy, lumbar region: Secondary | ICD-10-CM

## 2018-11-12 MED ORDER — METHYLPREDNISOLONE ACETATE 80 MG/ML IJ SUSP
80.0000 mg | Freq: Once | INTRAMUSCULAR | Status: AC
  Administered 2018-11-12: 80 mg

## 2018-11-12 NOTE — Patient Instructions (Signed)

## 2018-11-12 NOTE — Progress Notes (Signed)
.  Numeric Pain Rating Scale and Functional Assessment Average Pain 6   In the last MONTH (on 0-10 scale) has pain interfered with the following?  1. General activity like being  able to carry out your everyday physical activities such as walking, climbing stairs, carrying groceries, or moving a chair?  Rating(5)   +Driver, -BT, -Dye Allergies.  

## 2018-11-13 MED ORDER — METHYLPREDNISOLONE ACETATE 80 MG/ML IJ SUSP
80.0000 mg | INTRAMUSCULAR | Status: AC | PRN
  Administered 2018-10-16: 80 mg via INTRA_ARTICULAR

## 2018-11-13 MED ORDER — BUPIVACAINE HCL 0.5 % IJ SOLN
2.0000 mL | INTRAMUSCULAR | Status: AC | PRN
  Administered 2018-10-16: 2 mL via INTRA_ARTICULAR

## 2018-11-13 NOTE — Procedures (Signed)
Lumbar Epidural Steroid Injection - Interlaminar Approach with Fluoroscopic Guidance  Patient: Michele Duncan      Date of Birth: Feb 09, 1951 MRN: [id number] PCP: [name], MD      Visit Date: 11/12/2018   Universal Protocol:     Consent Given By: the patient  Position: PRONE  Additional Comments: Vital signs were monitored before and after the procedure. Patient was prepped and draped in the usual sterile fashion. The correct patient, procedure, and site was verified.   Injection Procedure Details:  Procedure Site One Meds Administered:  Meds ordered this encounter  Medications  . methylPREDNISolone acetate (DEPO-MEDROL) injection 80 mg     Laterality: Left  Location/Site:  L4-L5  Needle size: 20 G  Needle type: Tuohy  Needle Placement: Paramedian epidural  Findings:   -Comments: Excellent flow of contrast into the epidural space.  Procedure Details: Using a paramedian approach from the side mentioned above, the region overlying the inferior lamina was localized under fluoroscopic visualization and the soft tissues overlying this structure were infiltrated with 4 ml. of 1% Lidocaine without Epinephrine. The Tuohy needle was inserted into the epidural space using a paramedian approach.   The epidural space was localized using loss of resistance along with lateral and bi-planar fluoroscopic views.  After negative aspirate for air, blood, and CSF, a 2 ml. volume of Isovue-250 was injected into the epidural space and the flow of contrast was observed. Radiographs were obtained for documentation purposes.    The injectate was administered into the level noted above.   Additional Comments:  The patient tolerated the procedure well Dressing: Band-Aid    Post-procedure details: Patient was observed during the procedure. Post-procedure instructions were reviewed.  Patient left the clinic in stable condition.

## 2018-11-13 NOTE — Progress Notes (Signed)
Michele Duncan - 67 y.o. female MRN [id number]  Date of birth: April 02, 1951  Office Visit Note: Visit Date: 11/12/2018 PCP: [name], MD Referred by: [name], MD  Subjective: Chief Complaint  Patient presents with  . Lower Back - Pain   HPI:  Michele Duncan is a 67 y.o. female who comes in today Status post intra-articular coccyx injection with fluoroscopic guidance.  Unfortunately the patient did not have any relief with this procedure at all.  She continues with use of oxycodone but without any pain relief from the oxycodone whatsoever.  Reports her average pain is a 6 out of 10 prior notes showed her average pain 10 out of 10.  Again her biggest pain complaint is the buttock region and coccyx and she has difficult time sitting.  She also has a second level of pain which is chronic axial low back pain worse with standing for a length of time.  As discussed in our last note I think this is a combination of facet arthropathy and listhesis and mild to moderate stenosis at this level.  I reviewed the images with the patient again.  I do think there is more level of stenosis here than is regarded by the radiologist report.  I think the next step is diagnostic and hopefully therapeutic epidural injection.  We discussed the fact that we do not really do a series of 3 of these injections as a more diagnostic and if one helps it has just as good of time lasting as 3 or 20 would have.  But also if it does not work then we would look at facet joint block.  She has had no other changes since I last saw her no red flag complaints.  She feels like the tailbone pain may actually be a little bit worse after the injection overall.  Again this does not bear out in the pain scales.  I do feel like she be a good candidate for physical therapy at [facility] therapy for pelvic rehab.  ROS Otherwise per HPI.  Assessment & Plan: Visit Diagnoses:  1. Spondylosis without myelopathy or  radiculopathy, lumbar region   2. Lumbar radiculopathy     Plan: No additional findings.   Meds & Orders:  Meds ordered this encounter  Medications  . methylPREDNISolone acetate (DEPO-MEDROL) injection 80 mg    Orders Placed This Encounter  Procedures  . XR C-ARM NO REPORT  . Epidural Steroid injection    Follow-up: Return if symptoms worsen or fail to improve.   Procedures: No procedures performed  Lumbar Epidural Steroid Injection - Interlaminar Approach with Fluoroscopic Guidance  Patient: Michele Duncan      Date of Birth: [birth year] MRN: [id number] PCP: [name], MD      Visit Date: 11/12/2018   Universal Protocol:     Consent Given By: the patient  Position: PRONE  Additional Comments: Vital signs were monitored before and after the procedure. Patient was prepped and draped in the usual sterile fashion. The correct patient, procedure, and site was verified.   Injection Procedure Details:  Procedure Site One Meds Administered:  Meds ordered this encounter  Medications  . methylPREDNISolone acetate (DEPO-MEDROL) injection 80 mg     Laterality: Left  Location/Site:  L4-L5  Needle size: 20 G  Needle type: Tuohy  Needle Placement: Paramedian epidural  Findings:   -Comments: Excellent flow of contrast into the epidural space.  Procedure Details: Using a paramedian approach  from the side mentioned above, the region overlying the inferior lamina was localized under fluoroscopic visualization and the soft tissues overlying this structure were infiltrated with 4 ml. of 1% Lidocaine without Epinephrine. The Tuohy needle was inserted into the epidural space using a paramedian approach.   The epidural space was localized using loss of resistance along with lateral and bi-planar fluoroscopic views.  After negative aspirate for air, blood, and CSF, a 2 ml. volume of Isovue-250 was injected into the epidural space and the flow of contrast was observed.  Radiographs were obtained for documentation purposes.    The injectate was administered into the level noted above.   Additional Comments:  The patient tolerated the procedure well Dressing: Band-Aid    Post-procedure details: Patient was observed during the procedure. Post-procedure instructions were reviewed.  Patient left the clinic in stable condition.   Clinical History: MRI LUMBAR SPINE WITHOUT CONTRAST  TECHNIQUE: Multiplanar, multisequence MR imaging of the lumbar spine was performed. No intravenous contrast was administered.  COMPARISON:  None.  FINDINGS: Segmentation: Normal. The lowest disc space is considered to be L5-S1.  Alignment:  Grade 1 L4-5 anterolisthesis.  Vertebrae: No acute compression fracture, discitis-osteomyelitis of focal marrow lesion.  Conus medullaris and cauda equina: The conus medullaris terminates at the L1 level. The cauda equina and conus medullaris are both normal.  Paraspinal and other soft tissues: The visualized aorta, IVC and iliac vessels are normal. The visualized retroperitoneal organs and paraspinal soft tissues are normal.  Disc levels: Sagittal plane imaging includes the T10-11 disc level through the upper sacrum, with axial imaging of the T12-L1 to L5-S1 disc levels.  T10-11 and T11-12 are normal.  T12-L1: Normal.  L1-L2: Normal.  L2-L3: Mild disc bulge without spinal canal or neural foraminal stenosis.  L3-L4: Small disc bulge. No spinal canal or neural foraminal stenosis. Normal facets.  L4-L5: Severe facet hypertrophy causing grade 1 anterolisthesis. There is mild widening of the right facet. Fluid in both facet joints. There is mild spinal canal stenosis. No neural foraminal stenosis.  L5-S1: Normal.  The visualized portion of the sacrum is normal.  IMPRESSION: 1. Severe L4-5 facet arthrosis with grade 1 anterolisthesis and mild spinal canal stenosis. 2. Otherwise mild  degenerative disc disease without stenosis.   Electronically Signed   By: Ulyses Jarred M.D.   On: 09/24/2018 01:21     Objective:  VS:  HT:    WT:   BMI:     BP:(!) 161/84  HR:79bpm  TEMP:98.4 F (36.9 C)(Oral)  RESP:  Physical Exam  Ortho Exam Imaging: Xr C-arm No Report  Result Date: 11/12/2018 Please see Notes tab for imaging impression.

## 2018-11-22 DIAGNOSIS — G4700 Insomnia, unspecified: Secondary | ICD-10-CM | POA: Diagnosis not present

## 2018-11-22 DIAGNOSIS — E559 Vitamin D deficiency, unspecified: Secondary | ICD-10-CM | POA: Diagnosis not present

## 2018-11-22 DIAGNOSIS — E785 Hyperlipidemia, unspecified: Secondary | ICD-10-CM | POA: Diagnosis not present

## 2018-11-22 DIAGNOSIS — E2839 Other primary ovarian failure: Secondary | ICD-10-CM | POA: Diagnosis not present

## 2018-11-22 DIAGNOSIS — R5383 Other fatigue: Secondary | ICD-10-CM | POA: Diagnosis not present

## 2018-11-22 DIAGNOSIS — I10 Essential (primary) hypertension: Secondary | ICD-10-CM | POA: Diagnosis not present

## 2018-11-22 DIAGNOSIS — N899 Noninflammatory disorder of vagina, unspecified: Secondary | ICD-10-CM | POA: Diagnosis not present

## 2018-12-06 ENCOUNTER — Encounter: Payer: Self-pay | Admitting: Orthopaedic Surgery

## 2018-12-06 ENCOUNTER — Other Ambulatory Visit: Payer: Self-pay | Admitting: Internal Medicine

## 2018-12-06 ENCOUNTER — Telehealth (INDEPENDENT_AMBULATORY_CARE_PROVIDER_SITE_OTHER): Payer: Self-pay | Admitting: Physical Medicine and Rehabilitation

## 2018-12-06 ENCOUNTER — Ambulatory Visit (INDEPENDENT_AMBULATORY_CARE_PROVIDER_SITE_OTHER): Payer: Medicare Other | Admitting: Orthopaedic Surgery

## 2018-12-06 DIAGNOSIS — M5136 Other intervertebral disc degeneration, lumbar region: Secondary | ICD-10-CM | POA: Diagnosis not present

## 2018-12-06 DIAGNOSIS — N950 Postmenopausal bleeding: Secondary | ICD-10-CM

## 2018-12-06 MED ORDER — DICLOFENAC SODIUM 75 MG PO TBEC: 75.0000 mg | DELAYED_RELEASE_TABLET | Freq: Two times a day (BID) | ORAL | 2 refills | Status: DC

## 2018-12-06 NOTE — Progress Notes (Signed)
Patient Michele Duncan, female DOB:September 22, 1951, 68 y.o. [id number]  Chief Complaint  Patient presents with  . Back Pain    HPI  Michele Duncan is a 68 y.o. female who has lower back pain.  She had the epidural injection on December 9th.  She got a little better but still has the pain.  I will stop the Naprosyn as it does not help and change to diclofenac.  Continue exercises and activities.   There is no height or weight on file to calculate BMI.  ROS  Review of Systems  Constitutional: Positive for activity change.  Musculoskeletal: Positive for arthralgias and back pain.  Psychiatric/Behavioral: The patient is nervous/anxious.   All other systems reviewed and are negative.   All other systems reviewed and are negative.  The following is a summary of the past history medically, past history surgically, known current medicines, social history and family history.  This information is gathered electronically by the computer from prior information and documentation.  I review this each visit and have found including this information at this point in the chart is beneficial and informative.    Past Medical History:  Diagnosis Date  . Anxiety   . Arthritis    low back  . Chronic anal fissure   . Chronic low back pain   . Grade II internal hemorrhoids   . Hemorrhoids, external   . History of adenomatous polyp of colon    11-06-2017  . HTN (hypertension)   . Wears glasses     Past Surgical History:  Procedure Laterality Date  . BREAST BIOPSY Left 1970s   benign   . COLONOSCOPY N/A 11/06/2017   Procedure: COLONOSCOPY;  Surgeon: Danie Binder, MD;  Location: [facility];  Service: Endoscopy;  Laterality: N/A;  1:45pm  . HEMORRHOID SURGERY N/A 02/28/2018   Procedure: HEMORRHOIDECTOMY;  Surgeon: Michael Boston, MD;  Location: [facility];  Service: General;  Laterality: N/A;  . SPHINCTEROTOMY N/A 02/28/2018   Procedure: LATERAL SPHINCTEROTOMY;  Surgeon: Michael Boston, MD;  Location: [place];  Service: General;  Laterality: N/A;    Family History  Problem Relation Age of Onset  . Colon cancer Paternal Grandmother   . Cancer Mother   . Hypertension Father   . Heart disease Father   . Autoimmune disease Sister   . Lung disease Sister   . Colon polyps Neg Hx     Social History Social History   Tobacco Use  . Smoking status: Former Smoker    Types: Cigarettes    Last attempt to quit: 12/05/2013    Years since quitting: 5.0  . Smokeless tobacco: Never Used  Substance Use Topics  . Alcohol use: No    Frequency: Never  . Drug use: No    Allergies  Allergen Reactions  . Penicillins Shortness Of Breath and Itching    Has patient had a PCN reaction causing immediate rash, facial/tongue/throat swelling, SOB or lightheadedness with hypotension: Yes Has patient had a PCN reaction causing severe rash involving mucus membranes or skin necrosis: No Has patient had a PCN reaction that required hospitalization: No Has patient had a PCN reaction occurring within the last 10 years: No If all of the above answers are "NO", then may proceed with Cephalosporin use.   . Codeine Itching    Current Outpatient Medications  Medication Sig Dispense Refill  . acetaminophen (TYLENOL) 650 MG CR tablet Take 650 mg by mouth every 8 (eight) hours  as needed for pain.    Marland Kitchen aluminum hydroxide-magnesium carbonate (GAVISCON) 95-358 MG/15ML SUSP Take 15 mLs by mouth as needed for indigestion.    . Ca Carbonate-Mag Hydroxide (ROLAIDS) 550-110 MG CHEW Chew 1-2 each by mouth daily as needed (for indigestion).    . Cholecalciferol (VITAMIN D-3 PO) Take 7,000 Int'l Units by mouth daily.    Marland Kitchen diltiazem 2 % GEL Apply 1 application topically 2 (two) times daily as needed.    Marland Kitchen estradiol (ESTRACE) 0.5 MG tablet Take 0.5 mg by mouth daily.  3  . losartan-hydrochlorothiazide (HYZAAR) 50-12.5 MG tablet Take 1 tablet by mouth every morning.     . NP THYROID 60  MG tablet Take 60 mg by mouth daily.  3  . oxyCODONE (OXY IR/ROXICODONE) 5 MG immediate release tablet Take 1-2 tablets (5-10 mg total) by mouth every 6 (six) hours as needed for moderate pain, severe pain or breakthrough pain. 30 tablet 0  . pantoprazole (PROTONIX) 20 MG tablet Take 1 tablet (20 mg total) by mouth daily. 15 tablet 0  . potassium chloride SA (K-DUR,KLOR-CON) 20 MEQ tablet Take 40 mEq by mouth every morning.     . progesterone (PROMETRIUM) 200 MG capsule TAKE 1 CAPSULE BY MOUTH EVERY NIGHT  3  . diclofenac (VOLTAREN) 75 MG EC tablet Take 1 tablet (75 mg total) by mouth 2 (two) times daily with a meal. 60 tablet 2   No current facility-administered medications for this visit.      Physical Exam  There were no vitals taken for this visit.  Constitutional: overall normal hygiene, normal nutrition, well developed, normal grooming, normal body habitus. Assistive device:none  Musculoskeletal: gait and station Limp none, muscle tone and strength are normal, no tremors or atrophy is present.  .  Neurological: coordination overall normal.  Deep tendon reflex/nerve stretch intact.  Sensation normal.  Cranial nerves II-XII intact.   Skin:   Normal overall no scars, lesions, ulcers or rashes. No psoriasis.  Psychiatric: Alert and oriented x 3.  Recent memory intact, remote memory unclear.  Normal mood and affect. Well groomed.  Good eye contact.  Cardiovascular: overall no swelling, no varicosities, no edema bilaterally, normal temperatures of the legs and arms, no clubbing, cyanosis and good capillary refill.  Lymphatic: palpation is normal.  Spine/Pelvis examination:  Inspection:  Overall, sacoiliac joint benign and hips nontender; without crepitus or defects.   Thoracic spine inspection: Alignment normal without kyphosis present   Lumbar spine inspection:  Alignment  with normal lumbar lordosis, without scoliosis apparent.   Thoracic spine palpation:  without tenderness of  spinal processes   Lumbar spine palpation: without tenderness of lumbar area; without tightness of lumbar muscles    Range of Motion:   Lumbar flexion, forward flexion is normal without pain or tenderness    Lumbar extension is full without pain or tenderness   Left lateral bend is normal without pain or tenderness   Right lateral bend is normal without pain or tenderness   Straight leg raising is normal  Strength & tone: normal   Stability overall normal stability  All other systems reviewed and are negative   The patient has been educated about the nature of the problem(s) and counseled on treatment options.  The patient appeared to understand what I have discussed and is in agreement with it.  Encounter Diagnosis  Name Primary?  . Other intervertebral disc degeneration, lumbar region Yes    PLAN Call if any problems.  Precautions discussed.  Continue current medications. I did change to diclofenac and stopped the Naprosyn.  Return to clinic 1 month   Electronically Signed Sanjuana Kava, MD 1/2/20209:58 AM

## 2018-12-07 NOTE — Telephone Encounter (Signed)
Pt states last inj only helped a little, per Dr. Ernestina Patches pt is scheduled 12/24/2018 with driver for M7-5 facet.

## 2018-12-07 NOTE — Telephone Encounter (Signed)
Need more details about how she did with injection, if more than 50% relief then repeat, if not much or very short lived then L4-5 facets, she has coccyx pain mostly too so complicated

## 2018-12-07 NOTE — Telephone Encounter (Signed)
Can you call patient to discuss/ schedule?

## 2018-12-10 ENCOUNTER — Ambulatory Visit (HOSPITAL_COMMUNITY): Payer: Medicare Other

## 2018-12-10 DIAGNOSIS — N950 Postmenopausal bleeding: Secondary | ICD-10-CM | POA: Diagnosis not present

## 2018-12-24 ENCOUNTER — Encounter (INDEPENDENT_AMBULATORY_CARE_PROVIDER_SITE_OTHER): Payer: Self-pay | Admitting: Physical Medicine and Rehabilitation

## 2018-12-24 ENCOUNTER — Ambulatory Visit (INDEPENDENT_AMBULATORY_CARE_PROVIDER_SITE_OTHER): Payer: Medicare Other | Admitting: Physical Medicine and Rehabilitation

## 2018-12-24 ENCOUNTER — Ambulatory Visit (INDEPENDENT_AMBULATORY_CARE_PROVIDER_SITE_OTHER): Payer: Self-pay

## 2018-12-24 VITALS — BP 150/79 | HR 70 | Temp 98.0°F | Ht 64.0 in | Wt 163.0 lb

## 2018-12-24 DIAGNOSIS — M47816 Spondylosis without myelopathy or radiculopathy, lumbar region: Secondary | ICD-10-CM

## 2018-12-24 DIAGNOSIS — M533 Sacrococcygeal disorders, not elsewhere classified: Secondary | ICD-10-CM | POA: Diagnosis not present

## 2018-12-24 MED ORDER — METHYLPREDNISOLONE ACETATE 80 MG/ML IJ SUSP
80.0000 mg | Freq: Once | INTRAMUSCULAR | Status: AC
  Administered 2018-12-24: 80 mg

## 2018-12-24 NOTE — Progress Notes (Signed)
 .  Numeric Pain Rating Scale and Functional Assessment Average Pain 7   In the last MONTH (on 0-10 scale) has pain interfered with the following?  1. General activity like being  able to carry out your everyday physical activities such as walking, climbing stairs, carrying groceries, or moving a chair?  Rating(4)   +Driver, -BT, -Dye Allergies.  

## 2018-12-24 NOTE — Patient Instructions (Signed)

## 2018-12-25 ENCOUNTER — Encounter (INDEPENDENT_AMBULATORY_CARE_PROVIDER_SITE_OTHER): Payer: Self-pay | Admitting: Physical Medicine and Rehabilitation

## 2018-12-25 NOTE — Progress Notes (Signed)
Michele Duncan - 68 y.o. female MRN [id number]  Date of birth: 03-Jul-1951  Office Visit Note: Visit Date: 12/24/2018 PCP: [name], MD Referred by: [name], MD  Subjective: Chief Complaint  Patient presents with  . Spine - Pain  . Lower Back - Pain  . Tailbone Pain   HPI: Michele Duncan is a 69 y.o. female who comes in today For reevaluation of low back pain and coccydynia.  She was initially sent to [place] by Dr. Sanjuana Kava for an epidural series of injections.  Just as a note we do not really perform epidural series we do a lot of injections trying to look at these as a diagnostic issue not only a therapeutic issue.  Nonetheless when I first saw her she was having mostly coccydynia with pain at the center tailbone worse with sitting and she was really uncomfortable trying to sit for any length of time.  MRI had been performed by Dr. Luna Glasgow and this did show facet arthropathy at L4-5 with listhesis but no focal stenosis.  Some lateral recess narrowing.  We completed diagnostic coccyx injection with fluoroscopic guidance without much relief at all.  Interestingly she has a history of external hemorrhoids and rectal bleeding and anal fissure in the past.  She has not undergone pelvic physical therapy but we talked about it when we first saw her.  The last time I saw her I did decide to complete a one-time epidural injection diagnostically.  She did get 50% relief for a couple of weeks and the symptoms returned.  Diagnostically this may indicate that some of her pain may be related to the lumbar spine.  She does not have pain down the legs or focal weakness.  Most of the pain is with sitting but she does get some facet type pain with prolonged standing and to find a place to sit.  L4-5 facet joint arthritis is fairly severe.  Her biggest complaint is the pain with sitting.  Review of Systems  Constitutional: Negative for chills, fever, malaise/fatigue and weight loss.  HENT: Negative  for hearing loss and sinus pain.   Eyes: Negative for blurred vision, double vision and photophobia.  Respiratory: Negative for cough and shortness of breath.   Cardiovascular: Negative for chest pain, palpitations and leg swelling.  Gastrointestinal: Negative for abdominal pain, nausea and vomiting.  Genitourinary: Negative for flank pain.  Musculoskeletal: Positive for back pain. Negative for myalgias.       Pain at the tailbone  Skin: Negative for itching and rash.  Neurological: Negative for tremors, focal weakness and weakness.  Endo/Heme/Allergies: Negative.   Psychiatric/Behavioral: Negative for depression.  All other systems reviewed and are negative.  Otherwise per HPI.  Assessment & Plan: Visit Diagnoses:  1. Spondylosis without myelopathy or radiculopathy, lumbar region   2. Coccydynia     Plan: Findings:  Chronic worsening severe at times coccydynia with painful sitting and prolonged sitting.  MRI showing facet arthritis without stenosis or focal disc herniation.  She has not had a pelvic MRI.  Diagnostic coccyx injection was not very beneficial.  I think at this point we need to refer her to [facility] and [place] that does a good job with pelvic pain and coccydynia.  She can coordinate this with some procedures she is having from the gynecologist.  I also feel like she is getting some back pain in general from the facet arthritis.  We are going to complete diagnostic  facet joint blocks at L4-5.  This would be the last injection we would perform at least currently if it just does not help long-term.  If it is very beneficial and she were to say this helped more than the other 2 injections then at least we would identify some source of pain for her back.  Would be a good candidate down the road for radiofrequency ablation.    Meds & Orders:  Meds ordered this encounter  Medications  . methylPREDNISolone acetate (DEPO-MEDROL) injection 80 mg    Orders  Placed This Encounter  Procedures  . Facet Injection  . XR C-ARM NO REPORT  . Ambulatory referral to Physical Therapy    Follow-up: Return if symptoms worsen or fail to improve, for Post PT for pelvic pain and coccxdynia.   Procedures: No procedures performed  Lumbar Facet Joint Intra-Articular Injection(s) with Fluoroscopic Guidance  Patient: Michele Duncan      Date of Birth: 30-Dec-1950 MRN: [id number] PCP: [name], MD      Visit Date: 12/24/2018   Universal Protocol:    Date/Time: 12/24/2018  Consent Given By: the patient  Position: PRONE   Additional Comments: Vital signs were monitored before and after the procedure. Patient was prepped and draped in the usual sterile fashion. The correct patient, procedure, and site was verified.   Injection Procedure Details:  Procedure Site One Meds Administered:  Meds ordered this encounter  Medications  . methylPREDNISolone acetate (DEPO-MEDROL) injection 80 mg     Laterality: Bilateral  Location/Site:  L4-L5  Needle size: 22 guage  Needle type: Spinal  Needle Placement: Articular  Findings:  -Comments: Excellent flow of contrast producing a partial arthrogram.  Procedure Details: The fluoroscope beam is vertically oriented in AP, and the inferior recess is visualized beneath the lower pole of the inferior apophyseal process, which represents the target point for needle insertion. When direct visualization is difficult the target point is located at the medial projection of the vertebral pedicle. The region overlying each aforementioned target is locally anesthetized with a 1 to 2 ml. volume of 1% Lidocaine without Epinephrine.   The spinal needle was inserted into each of the above mentioned facet joints using biplanar fluoroscopic guidance. A 0.25 to 0.5 ml. volume of Isovue-250 was injected and a partial facet joint arthrogram was obtained. A single spot film was obtained of the resulting arthrogram.    One  to 1.25 ml of the steroid/anesthetic solution was then injected into each of the facet joints noted above.   Additional Comments:  The patient tolerated the procedure well Dressing: Band-Aid    Post-procedure details: Patient was observed during the procedure. Post-procedure instructions were reviewed.  Patient left the clinic in stable condition.    Clinical History: MRI LUMBAR SPINE WITHOUT CONTRAST  TECHNIQUE: Multiplanar, multisequence MR imaging of the lumbar spine was performed. No intravenous contrast was administered.  COMPARISON:  None.  FINDINGS: Segmentation: Normal. The lowest disc space is considered to be L5-S1.  Alignment:  Grade 1 L4-5 anterolisthesis.  Vertebrae: No acute compression fracture, discitis-osteomyelitis of focal marrow lesion.  Conus medullaris and cauda equina: The conus medullaris terminates at the L1 level. The cauda equina and conus medullaris are both normal.  Paraspinal and other soft tissues: The visualized aorta, IVC and iliac vessels are normal. The visualized retroperitoneal organs and paraspinal soft tissues are normal.  Disc levels: Sagittal plane imaging includes the T10-11 disc level through the upper sacrum, with axial imaging of the  T12-L1 to L5-S1 disc levels.  T10-11 and T11-12 are normal.  T12-L1: Normal.  L1-L2: Normal.  L2-L3: Mild disc bulge without spinal canal or neural foraminal stenosis.  L3-L4: Small disc bulge. No spinal canal or neural foraminal stenosis. Normal facets.  L4-L5: Severe facet hypertrophy causing grade 1 anterolisthesis. There is mild widening of the right facet. Fluid in both facet joints. There is mild spinal canal stenosis. No neural foraminal stenosis.  L5-S1: Normal.  The visualized portion of the sacrum is normal.  IMPRESSION: 1. Severe L4-5 facet arthrosis with grade 1 anterolisthesis and mild spinal canal stenosis. 2. Otherwise mild degenerative disc  disease without stenosis.   Electronically Signed   By: Ulyses Jarred M.D.   On: 09/24/2018 01:21   She reports that she quit smoking about 5 years ago. Her smoking use included cigarettes. She has never used smokeless tobacco. No results for input(s): HGBA1C, LABURIC in the last 8760 hours.  Objective:  VS:  HT:5\' 4"  (162.6 cm)   WT:163 lb (73.9 kg)  BMI:27.97    BP:(!) 150/79  HR:70bpm  TEMP:98 F (36.7 C)(Oral)  RESP:  Physical Exam Vitals signs and nursing note reviewed.  Constitutional:      General: She is not in acute distress.    Appearance: Normal appearance. She is well-developed. She is not ill-appearing.  HENT:     Head: Normocephalic and atraumatic.  Eyes:     Conjunctiva/sclera: Conjunctivae normal.     Pupils: Pupils are equal, round, and reactive to light.  Cardiovascular:     Rate and Rhythm: Normal rate.     Pulses: Normal pulses.  Pulmonary:     Effort: Pulmonary effort is normal.  Musculoskeletal:     Right lower leg: No edema.     Left lower leg: No edema.     Comments: Patient has pain with facet joint loading and extension of the lumbar spine but it is across the lower back and not the coccyx.  She has no pain with hip rotation.  No pain over the PSIS.  Pain over the coccyx.  She has good distal strength without clonus.  Skin:    General: Skin is warm and dry.     Findings: No erythema or rash.  Neurological:     General: No focal deficit present.     Mental Status: She is alert and oriented to person, place, and time.     Sensory: No sensory deficit.     Motor: No abnormal muscle tone.     Coordination: Coordination normal.     Gait: Gait normal.  Psychiatric:        Mood and Affect: Mood normal.        Behavior: Behavior normal.     Ortho Exam Imaging: Xr C-arm No Report  Result Date: 12/24/2018 Please see Notes tab for imaging impression.   Past Medical/Family/Surgical/Social History: Medications & Allergies reviewed per EMR, new  medications updated. Patient Active Problem List   Diagnosis Date Noted  . Chronic posterior anal fissure 02/28/2018  . External hemorrhoids with pain 02/28/2018  . Rectal bleeding 11/02/2017   Past Medical History:  Diagnosis Date  . Anxiety   . Arthritis    low back  . Chronic anal fissure   . Chronic low back pain   . Grade II internal hemorrhoids   . Hemorrhoids, external   . History of adenomatous polyp of colon    11-06-2017  . HTN (hypertension)   . Wears glasses  Family History  Problem Relation Age of Onset  . Colon cancer Paternal Grandmother   . Cancer Mother   . Hypertension Father   . Heart disease Father   . Autoimmune disease Sister   . Lung disease Sister   . Colon polyps Neg Hx    Past Surgical History:  Procedure Laterality Date  . BREAST BIOPSY Left 1970s   benign   . COLONOSCOPY N/A 11/06/2017   Procedure: COLONOSCOPY;  Surgeon: Danie Binder, MD;  Location: [facility];  Service: Endoscopy;  Laterality: N/A;  1:45pm  . HEMORRHOID SURGERY N/A 02/28/2018   Procedure: HEMORRHOIDECTOMY;  Surgeon: Michael Boston, MD;  Location: [facility];  Service: General;  Laterality: N/A;  . SPHINCTEROTOMY N/A 02/28/2018   Procedure: LATERAL SPHINCTEROTOMY;  Surgeon: Michael Boston, MD;  Location: [facility];  Service: General;  Laterality: N/A;   Social History   Occupational History  . Not on file  Tobacco Use  . Smoking status: Former Smoker    Types: Cigarettes    Last attempt to quit: 12/05/2013    Years since quitting: 5.0  . Smokeless tobacco: Never Used  Substance and Sexual Activity  . Alcohol use: No    Frequency: Never  . Drug use: No  . Sexual activity: Yes

## 2018-12-25 NOTE — Procedures (Signed)
Lumbar Facet Joint Intra-Articular Injection(s) with Fluoroscopic Guidance  Patient: Michele Duncan      Date of Birth: 10-17-1951 MRN: [id number] PCP: [name], MD      Visit Date: 12/24/2018   Universal Protocol:    Date/Time: 12/24/2018  Consent Given By: the patient  Position: PRONE   Additional Comments: Vital signs were monitored before and after the procedure. Patient was prepped and draped in the usual sterile fashion. The correct patient, procedure, and site was verified.   Injection Procedure Details:  Procedure Site One Meds Administered:  Meds ordered this encounter  Medications  . methylPREDNISolone acetate (DEPO-MEDROL) injection 80 mg     Laterality: Bilateral  Location/Site:  L4-L5  Needle size: 22 guage  Needle type: Spinal  Needle Placement: Articular  Findings:  -Comments: Excellent flow of contrast producing a partial arthrogram.  Procedure Details: The fluoroscope beam is vertically oriented in AP, and the inferior recess is visualized beneath the lower pole of the inferior apophyseal process, which represents the target point for needle insertion. When direct visualization is difficult the target point is located at the medial projection of the vertebral pedicle. The region overlying each aforementioned target is locally anesthetized with a 1 to 2 ml. volume of 1% Lidocaine without Epinephrine.   The spinal needle was inserted into each of the above mentioned facet joints using biplanar fluoroscopic guidance. A 0.25 to 0.5 ml. volume of Isovue-250 was injected and a partial facet joint arthrogram was obtained. A single spot film was obtained of the resulting arthrogram.    One to 1.25 ml of the steroid/anesthetic solution was then injected into each of the facet joints noted above.   Additional Comments:  The patient tolerated the procedure well Dressing: Band-Aid    Post-procedure details: Patient was observed during the  procedure. Post-procedure instructions were reviewed.  Patient left the clinic in stable condition.

## 2019-01-01 DIAGNOSIS — N950 Postmenopausal bleeding: Secondary | ICD-10-CM | POA: Diagnosis not present

## 2019-01-03 ENCOUNTER — Ambulatory Visit: Payer: Medicare Other | Admitting: Orthopaedic Surgery

## 2019-01-08 DIAGNOSIS — N898 Other specified noninflammatory disorders of vagina: Secondary | ICD-10-CM | POA: Diagnosis not present

## 2019-01-08 DIAGNOSIS — E2839 Other primary ovarian failure: Secondary | ICD-10-CM | POA: Diagnosis not present

## 2019-01-08 DIAGNOSIS — N950 Postmenopausal bleeding: Secondary | ICD-10-CM | POA: Diagnosis not present

## 2019-01-15 DIAGNOSIS — N952 Postmenopausal atrophic vaginitis: Secondary | ICD-10-CM | POA: Diagnosis not present

## 2019-01-15 DIAGNOSIS — R102 Pelvic and perineal pain: Secondary | ICD-10-CM | POA: Diagnosis not present

## 2019-02-12 DIAGNOSIS — N952 Postmenopausal atrophic vaginitis: Secondary | ICD-10-CM | POA: Diagnosis not present

## 2019-02-12 DIAGNOSIS — N644 Mastodynia: Secondary | ICD-10-CM | POA: Diagnosis not present

## 2019-02-14 ENCOUNTER — Other Ambulatory Visit: Payer: Self-pay | Admitting: Obstetrics & Gynecology

## 2019-02-14 DIAGNOSIS — R5381 Other malaise: Secondary | ICD-10-CM

## 2019-02-18 ENCOUNTER — Other Ambulatory Visit: Payer: Self-pay | Admitting: Obstetrics & Gynecology

## 2019-02-18 DIAGNOSIS — N644 Mastodynia: Secondary | ICD-10-CM

## 2019-02-22 ENCOUNTER — Encounter (INDEPENDENT_AMBULATORY_CARE_PROVIDER_SITE_OTHER): Payer: Self-pay | Admitting: Internal Medicine

## 2019-03-12 ENCOUNTER — Other Ambulatory Visit: Payer: Medicare Other

## 2019-05-08 ENCOUNTER — Other Ambulatory Visit: Payer: Self-pay

## 2019-05-09 DIAGNOSIS — N760 Acute vaginitis: Secondary | ICD-10-CM | POA: Diagnosis not present

## 2019-05-09 MED ORDER — NAPROXEN 500 MG PO TABS: 500.0000 mg | ORAL_TABLET | Freq: Two times a day (BID) | ORAL | 1 refills | Status: DC

## 2019-05-17 DIAGNOSIS — N898 Other specified noninflammatory disorders of vagina: Secondary | ICD-10-CM | POA: Diagnosis not present

## 2019-05-17 DIAGNOSIS — N950 Postmenopausal bleeding: Secondary | ICD-10-CM | POA: Diagnosis not present

## 2019-05-28 ENCOUNTER — Ambulatory Visit (INDEPENDENT_AMBULATORY_CARE_PROVIDER_SITE_OTHER): Payer: Medicare Other | Admitting: Internal Medicine

## 2019-05-28 DIAGNOSIS — E2839 Other primary ovarian failure: Secondary | ICD-10-CM | POA: Diagnosis not present

## 2019-05-28 DIAGNOSIS — E785 Hyperlipidemia, unspecified: Secondary | ICD-10-CM | POA: Diagnosis not present

## 2019-05-28 DIAGNOSIS — I10 Essential (primary) hypertension: Secondary | ICD-10-CM | POA: Diagnosis not present

## 2019-05-28 DIAGNOSIS — N390 Urinary tract infection, site not specified: Secondary | ICD-10-CM | POA: Diagnosis not present

## 2019-05-28 DIAGNOSIS — E669 Obesity, unspecified: Secondary | ICD-10-CM | POA: Diagnosis not present

## 2019-05-28 DIAGNOSIS — E559 Vitamin D deficiency, unspecified: Secondary | ICD-10-CM | POA: Diagnosis not present

## 2019-05-28 DIAGNOSIS — N898 Other specified noninflammatory disorders of vagina: Secondary | ICD-10-CM | POA: Diagnosis not present

## 2019-07-16 DIAGNOSIS — R5383 Other fatigue: Secondary | ICD-10-CM | POA: Diagnosis not present

## 2019-07-16 DIAGNOSIS — E669 Obesity, unspecified: Secondary | ICD-10-CM | POA: Diagnosis not present

## 2019-07-16 DIAGNOSIS — E079 Disorder of thyroid, unspecified: Secondary | ICD-10-CM | POA: Diagnosis not present

## 2019-08-14 ENCOUNTER — Telehealth (INDEPENDENT_AMBULATORY_CARE_PROVIDER_SITE_OTHER): Payer: Self-pay

## 2019-08-14 ENCOUNTER — Other Ambulatory Visit (INDEPENDENT_AMBULATORY_CARE_PROVIDER_SITE_OTHER): Payer: Self-pay | Admitting: Internal Medicine

## 2019-08-14 MED ORDER — THYROID 90 MG PO TABS: 90.0000 mg | ORAL_TABLET | Freq: Every day | ORAL | 3 refills | Status: DC

## 2019-08-14 NOTE — Telephone Encounter (Signed)
Pt order has been sent to pharmacy.

## 2019-08-19 ENCOUNTER — Other Ambulatory Visit (INDEPENDENT_AMBULATORY_CARE_PROVIDER_SITE_OTHER): Payer: Self-pay | Admitting: Internal Medicine

## 2019-09-17 ENCOUNTER — Other Ambulatory Visit: Payer: Self-pay | Admitting: Orthopaedic Surgery

## 2019-09-24 ENCOUNTER — Other Ambulatory Visit: Payer: Self-pay

## 2019-09-24 ENCOUNTER — Ambulatory Visit (INDEPENDENT_AMBULATORY_CARE_PROVIDER_SITE_OTHER): Payer: Medicare Other

## 2019-09-24 DIAGNOSIS — Z23 Encounter for immunization: Secondary | ICD-10-CM | POA: Diagnosis not present

## 2019-10-17 ENCOUNTER — Other Ambulatory Visit: Payer: Self-pay

## 2019-10-17 ENCOUNTER — Ambulatory Visit (INDEPENDENT_AMBULATORY_CARE_PROVIDER_SITE_OTHER): Payer: Medicare Other | Admitting: Internal Medicine

## 2019-10-17 ENCOUNTER — Encounter (INDEPENDENT_AMBULATORY_CARE_PROVIDER_SITE_OTHER): Payer: Self-pay | Admitting: Internal Medicine

## 2019-10-17 VITALS — BP 152/90 | HR 104 | Temp 97.5°F | Resp 18 | Ht 65.0 in | Wt 173.0 lb

## 2019-10-17 DIAGNOSIS — N951 Menopausal and female climacteric states: Secondary | ICD-10-CM

## 2019-10-17 DIAGNOSIS — Z23 Encounter for immunization: Secondary | ICD-10-CM

## 2019-10-17 DIAGNOSIS — K219 Gastro-esophageal reflux disease without esophagitis: Secondary | ICD-10-CM

## 2019-10-17 DIAGNOSIS — I10 Essential (primary) hypertension: Secondary | ICD-10-CM | POA: Insufficient documentation

## 2019-10-17 DIAGNOSIS — E782 Mixed hyperlipidemia: Secondary | ICD-10-CM

## 2019-10-17 DIAGNOSIS — Z1159 Encounter for screening for other viral diseases: Secondary | ICD-10-CM | POA: Diagnosis not present

## 2019-10-17 DIAGNOSIS — E559 Vitamin D deficiency, unspecified: Secondary | ICD-10-CM

## 2019-10-17 DIAGNOSIS — E785 Hyperlipidemia, unspecified: Secondary | ICD-10-CM

## 2019-10-17 HISTORY — DX: Essential (primary) hypertension: I10

## 2019-10-17 HISTORY — DX: Vitamin D deficiency, unspecified: E55.9

## 2019-10-17 HISTORY — DX: Gastro-esophageal reflux disease without esophagitis: K21.9

## 2019-10-17 HISTORY — DX: Hyperlipidemia, unspecified: E78.5

## 2019-10-17 HISTORY — DX: Menopausal and female climacteric states: N95.1

## 2019-10-17 NOTE — Patient Instructions (Signed)
[facility] Dietary Recommendations for Weight Loss What to Avoid . Avoid added sugars o Often added sugar can be found in processed foods such as many condiments, dry cereals, cakes, cookies, chips, crisps, crackers, candies, sweetened drinks, etc.  o Read labels and AVOID/DECREASE use of foods with the following in their ingredient list: Sugar, fructose, high fructose corn syrup, sucrose, glucose, maltose, dextrose, molasses, cane sugar, brown sugar, any type of syrup, agave nectar, etc.   . Avoid snacking in between meals . Avoid foods made with flour o If you are going to eat food made with flour, choose those made with whole-grains; and, minimize your consumption as much as is tolerable . Avoid processed foods o These foods are generally stocked in the middle of the grocery store. Focus on shopping on the perimeter of the grocery.  What to Include . Vegetables o GREEN LEAFY VEGETABLES: Kale, spinach, mustard greens, collard greens, cabbage, broccoli, etc. o OTHER: Asparagus, cauliflower, eggplant, carrots, peas, Brussel sprouts, tomatoes, bell peppers, zucchini, beets, cucumbers, etc. . Grains, seeds, and legumes o Beans: kidney beans, black eyed peas, garbanzo beans, black beans, pinto beans, etc. o Whole, unrefined grains: brown rice, barley, bulgur, oatmeal, etc. . Healthy fats  o Avoid highly processed fats such as vegetable oil o Examples of healthy fats: avocado, olives, virgin olive oil, dark chocolate (?72% Cocoa), nuts (peanuts, almonds, walnuts, cashews, pecans, etc.) . Low - Moderate Intake of Animal Sources of Protein o Meat sources: chicken, turkey, salmon, tuna. Limit to 4 ounces of meat at one time. o Consider limiting dairy sources, but when choosing dairy focus on: PLAIN Greek yogurt, cottage cheese, high-protein milk . Fruit o Choose berries  When to Eat . Intermittent Fasting: o Choosing not to eat for a specific time period, but DO FOCUS ON HYDRATION  when fasting o Multiple Techniques: - Time Restricted Eating: eat 3 meals in a day, each meal lasting no more than 60 minutes, no snacks between meals - 16-18 hour fast: fast for 16 to 18 hours up to 7 days a week. Often suggested to start with 2-3 nonconsecutive days per week.  . Remember the time you sleep is counted as fasting.  . Examples of eating schedule: Fast from 7:00pm-11:00am. Eat between 11:00am-7:00pm.  - 24-hour fast: fast for 24 hours up to every other day. Often suggested to start with 1 day per week . Remember the time you sleep is counted as fasting . Examples of eating schedule:  o Eating day: eat 2-3 meals on your eating day. If doing 2 meals, each meal should last no more than 90 minutes. If doing 3 meals, each meal should last no more than 60 minutes. Finish last meal by 7:00pm. o Fasting day: Fast until 7:00pm.  o IF YOU FEEL UNWELL FOR ANY REASON/IN ANY WAY WHEN FASTING, STOP FASTING BY EATING A NUTRITIOUS SNACK OR LIGHT MEAL o ALWAYS FOCUS ON HYDRATION DURING FASTS - Acceptable Hydration sources: water, broths, tea/coffee (black tea/coffee is best but using a small amount of whole-fat dairy products in coffee/tea is acceptable).  - Poor Hydration Sources: anything with sugar or artificial sweeteners added to it  These recommendations have been developed for patients that are actively receiving medical care from either Dr. Senica Crall or Sarah Gray, DNP, NP-C at [facility]. These recommendations are developed for patients with specific medical conditions and are not meant to be distributed or used by others that are not actively receiving care from either provider listed   above at [facility]. It is not appropriate to participate in the above eating plans without proper medical supervision.   Reference: Fung, J. The obesity code. [place]: [place]; 2016.   

## 2019-10-17 NOTE — Progress Notes (Signed)
Metrics: Intervention Frequency ACO  Documented Smoking Status Yearly  Screened one or more times in 24 months  Cessation Counseling or  Active cessation medication Past 24 months  Past 24 months   Guideline developer: UpToDate (See UpToDate for funding source) Date Released: 2014       Wellness Office Visit  Subjective:  Patient ID: Michele Duncan, female    DOB: 03/08/1951  Age: 68 y.o. MRN: [id number]  CC: This lady comes in for follow-up regarding symptoms of menopause including hot flashes and night sweats, hypertension, vitamin D deficiency and hyperlipidemia. HPI  She is most troubled by hot flashes and night sweats more recently.  She is taking oral estradiol but I think it may not be a sufficient dose.  She is also taking estradiol cream to be applied to her labia for local effects which she takes 2-3 times a week. Progesterone seems to help her in terms of sleeping much better.  She continues with antihypertensive therapy without any problems.  She denies any chest pain, dyspnea, palpitations or limb weakness.  There is no history of coronary artery disease or cerebrovascular disease. She also continues to take desiccated NP thyroid.  She is tolerating this well. Past Medical History:  Diagnosis Date  . Anxiety   . Arthritis    low back  . Chronic anal fissure   . Chronic low back pain   . Essential hypertension, benign 10/17/2019  . GERD (gastroesophageal reflux disease) 10/17/2019  . Grade II internal hemorrhoids   . Hemorrhoids, external   . History of adenomatous polyp of colon    11-06-2017  . HLD (hyperlipidemia) 10/17/2019  . Hot flashes due to menopause 10/17/2019  . HTN (hypertension)   . Vitamin D deficiency disease 10/17/2019  . Wears glasses       Family History  Problem Relation Age of Onset  . Colon cancer Paternal Grandmother   . Cancer Mother   . Hypertension Father   . Heart disease Father   . Autoimmune disease Sister   . Lung disease Sister    . 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   Social History   Tobacco Use  . Smoking status: Former Smoker    Types: Cigarettes    Quit date: 12/05/2013    Years since quitting: 5.8  . Smokeless tobacco: Never Used  Substance Use Topics  . Alcohol use: No    Frequency: Never    Current Meds  Medication Sig  . acetaminophen (TYLENOL) 650 MG CR tablet Take 650 mg by mouth every 8 (eight) hours as needed for pain.  . Cholecalciferol (VITAMIN D-3 PO) Take 7,000 Int'l Units by mouth daily.  Marland Kitchen estradiol (ESTRACE) 1 MG tablet Take 1 mg by mouth daily.   . Estradiol Micronized (EC-RX ESTRADIOL TD) Place 0.5 mLs onto the skin 2 (two) times a week. Compounded cream from [place]  . hydrochlorothiazide (MICROZIDE) 12.5 MG capsule TAKE (1) CAPSULE BY MOUTH ONCE DAILY.  Marland Kitchen losartan (COZAAR) 50 MG tablet TAKE 1 TABLET BY MOUTH ONCE A DAY.  . naproxen (NAPROSYN) 500 MG tablet Take 1 tablet (500 mg total) by mouth 2 (two) times daily with a meal.  . pantoprazole (PROTONIX) 40 MG tablet TAKE ONE TABLET BY MOUTH TWICE A DAY  . potassium chloride SA (K-DUR,KLOR-CON) 20 MEQ tablet Take 40 mEq by mouth every morning.   Marland Kitchen  progesterone (PROMETRIUM) 200 MG capsule Take 400 mg by mouth every evening.   . thyroid (NP THYROID) 90 MG tablet Take 1 tablet (90 mg total) by mouth daily.  . [DISCONTINUED] pantoprazole (PROTONIX) 20 MG tablet Take 1 tablet (20 mg total) by mouth daily.     Nutrition  Not consistent at the present time.  She has gained weight. Sleep  Very good sleep, uninterrupted.  Exercise  None regular. Bio Identical Hormones  This patient is being treated with desiccated thyroid, off label, for symptoms of thyroid deficiency.  The patient has been counseled regarding side effects and how to deal with them.  Micronized progesterone is being used in this patient for multiple  benefits based on studies including protection against uterine cancer, breast cancer, osteoporosis and heart disease. The patient has been counseled regarding side effects, benefits and modes of administration. The patient is agreeable that this therapy is an integral part of her wellness, quality of life and prevention of chronic disease.  Estradiol is being used in this patient for multiple benefits based on several studies including protection against heart disease, cerebrovascular disease, osteoporosis, colon cancer, Alzheimer's disease, macular degeneration and cataracts. The patient has been counseled regarding benefits and side effects and modes of administration. The patient is agreeable that this therapy is an integral to part of her wellness, quality of life and prevention of chronic disease.  Objective:   Today's Vitals: BP (!) 152/90 (BP Location: Left Arm, Patient Position: Sitting, Cuff Size: Normal)   Pulse (!) 104   Temp (!) 97.5 F (36.4 C) (Temporal)   Resp 18   Ht '5\' 5"'$  (1.651 m)   Wt 173 lb (78.5 kg)   SpO2 98% Comment: with mask  BMI 28.79 kg/m  Vitals with BMI 10/17/2019 12/24/2018 12/24/2018  Height '5\' 5"'$  - '5\' 4"'$   Weight 173 lbs - 163 lbs  BMI 63.84 - 53.64  Systolic 680 321 224  Diastolic 90 79 81  Pulse 825 70 81     Physical Exam    She looks systemically well.  Her blood pressure is elevated today but previously has been acceptable, I think she is somewhat stressed with everything including the pandemic.  She has gained weight.  She is alert and orientated without any focal neurological signs.   Assessment   1. Encounter for hepatitis C screening test for low risk patient   2. Essential hypertension, benign   3. Hot flashes due to menopause   4. Vitamin D deficiency disease   5. Mixed hyperlipidemia   6. Gastroesophageal reflux disease without esophagitis   7. Need for immunization against influenza       Tests ordered Orders Placed This  Encounter  Procedures  . CMP with eGFR([facility])  . Vitamin D, 25-hydroxy  . Estradiol  . Progesterone  . T3, Free  . TSH  . Hep C Antibody     Plan: 1. She will continue with antihypertensive therapy for the time being and we will continue to monitor her blood pressure.  I suspect today was 1 off reading because overall her blood pressure has been well controlled. 2. She will continue with bioidentical hormone therapy but I will check estradiol and progesterone levels.  I suspect we will need to raise the dose of estradiol in particular. 3. She will continue with vitamin D3 supplementation and we will check levels today. 4. She also will continue with PPI for her gastroesophageal reflux disease which she seems to think she may  not need twice a day and I have encouraged her to reduce it to once a day and see if she gets symptoms again or not.  The eventual goal will be to discontinue this completely. 5. She was given Tdap vaccination as well as Prevnar 13 vaccination today. 6. Further recommendations will depend on blood results and she will follow-up with Sarah in about 3 months time for an annual Medicare wellness visit.   No orders of the defined types were placed in this encounter.   [name], MD

## 2019-10-18 LAB — COMPLETE METABOLIC PANEL WITH GFR
AG Ratio: 1.8 (calc) (ref 1.0–2.5)
ALT: 13 U/L (ref 6–29)
AST: 12 U/L (ref 10–35)
Albumin: 4.4 g/dL (ref 3.6–5.1)
Alkaline phosphatase (APISO): 58 U/L (ref 37–153)
BUN: 11 mg/dL (ref 7–25)
CO2: 23 mmol/L (ref 20–32)
Calcium: 9.7 mg/dL (ref 8.6–10.4)
Chloride: 102 mmol/L (ref 98–110)
Creat: 0.86 mg/dL (ref 0.50–0.99)
GFR, Est African American: 82 mL/min/{1.73_m2} (ref >=60)
GFR, Est Non African American: 70 mL/min/{1.73_m2} (ref >=60)
Globulin: 2.4 g/dL (calc) (ref 1.9–3.7)
Glucose, Bld: 90 mg/dL (ref 65–99)
Potassium: 4.3 mmol/L (ref 3.5–5.3)
Sodium: 137 mmol/L (ref 135–146)
Total Bilirubin: 0.5 mg/dL (ref 0.2–1.2)
Total Protein: 6.8 g/dL (ref 6.1–8.1)

## 2019-10-18 LAB — TSH: TSH: 0.76 mIU/L (ref 0.40–4.50)

## 2019-10-18 LAB — HEPATITIS C ANTIBODY
Hepatitis C Ab: NONREACTIVE
SIGNAL TO CUT-OFF: 0 (ref <1.00)

## 2019-10-18 LAB — ESTRADIOL: Estradiol: 58 pg/mL

## 2019-10-18 LAB — VITAMIN D 25 HYDROXY (VIT D DEFICIENCY, FRACTURES): Vit D, 25-Hydroxy: 61 ng/mL (ref 30–100)

## 2019-10-18 LAB — PROGESTERONE: Progesterone: 18.9 ng/mL

## 2019-10-18 LAB — T3, FREE: T3, Free: 3.9 pg/mL (ref 2.3–4.2)

## 2019-11-05 ENCOUNTER — Other Ambulatory Visit (INDEPENDENT_AMBULATORY_CARE_PROVIDER_SITE_OTHER): Payer: Self-pay | Admitting: Internal Medicine

## 2019-11-05 ENCOUNTER — Other Ambulatory Visit: Payer: Self-pay | Admitting: Orthopaedic Surgery

## 2019-11-19 ENCOUNTER — Other Ambulatory Visit (INDEPENDENT_AMBULATORY_CARE_PROVIDER_SITE_OTHER): Payer: Self-pay | Admitting: Internal Medicine

## 2019-11-19 ENCOUNTER — Other Ambulatory Visit (INDEPENDENT_AMBULATORY_CARE_PROVIDER_SITE_OTHER): Payer: Self-pay

## 2019-11-19 MED ORDER — NAPROXEN 500 MG PO TABS: 500.0000 mg | ORAL_TABLET | Freq: Two times a day (BID) | ORAL | 1 refills | Status: DC

## 2019-12-05 ENCOUNTER — Ambulatory Visit: Payer: Medicare Other | Attending: Internal Medicine

## 2019-12-05 ENCOUNTER — Other Ambulatory Visit: Payer: Self-pay

## 2019-12-05 DIAGNOSIS — Z20822 Contact with and (suspected) exposure to covid-19: Secondary | ICD-10-CM

## 2019-12-06 LAB — NOVEL CORONAVIRUS, NAA: SARS-CoV-2, NAA: NOT DETECTED

## 2020-01-01 ENCOUNTER — Other Ambulatory Visit (INDEPENDENT_AMBULATORY_CARE_PROVIDER_SITE_OTHER): Payer: Self-pay

## 2020-01-01 MED ORDER — NAPROXEN 500 MG PO TABS: 500.0000 mg | ORAL_TABLET | Freq: Two times a day (BID) | ORAL | 1 refills | Status: DC

## 2020-01-03 IMAGING — MR MR LUMBAR SPINE W/O CM
4 of 5 series · 26 of 48 positions shown · non-contrast
Comparison: None.

CLINICAL DATA: Low back pain radiating to the buttocks.

EXAM:
MRI LUMBAR SPINE WITHOUT CONTRAST
TECHNIQUE: Multiplanar, multisequence MR imaging of the lumbar spine was
performed. No intravenous contrast was administered.

[Series 2: T2 · sagittal · 4.0mm · 0.55mm/px · 5 of 13 slices shown (1 of 2)]
[im 1/13]
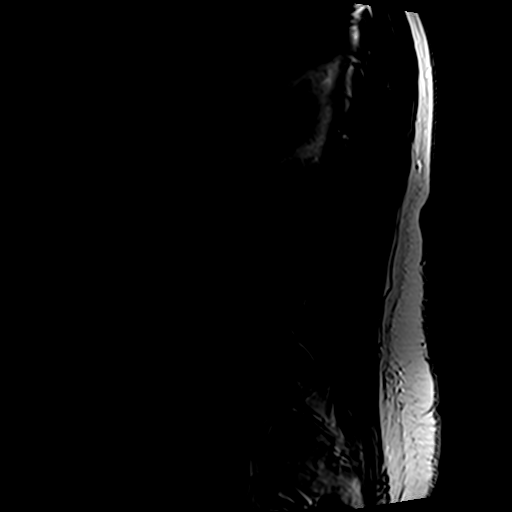
[im 4/13]
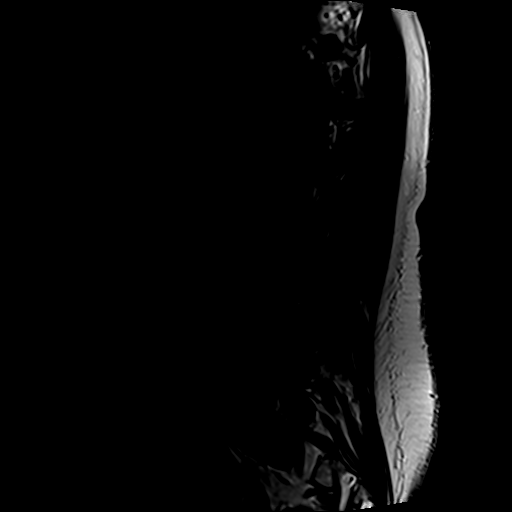
[im 7/13]
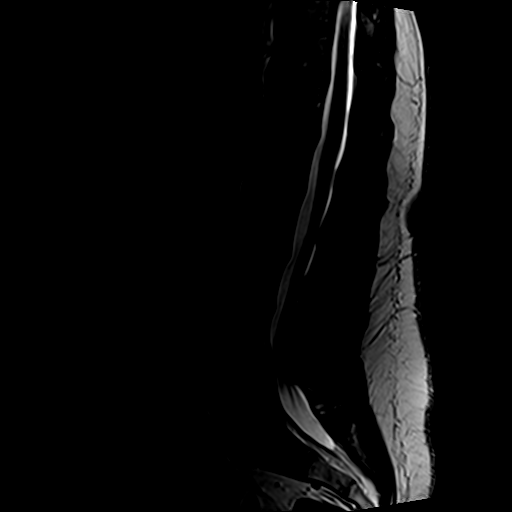
[im 10/13]
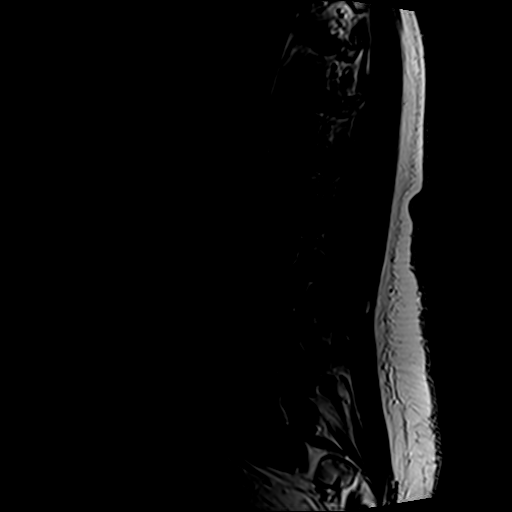
[im 13/13]
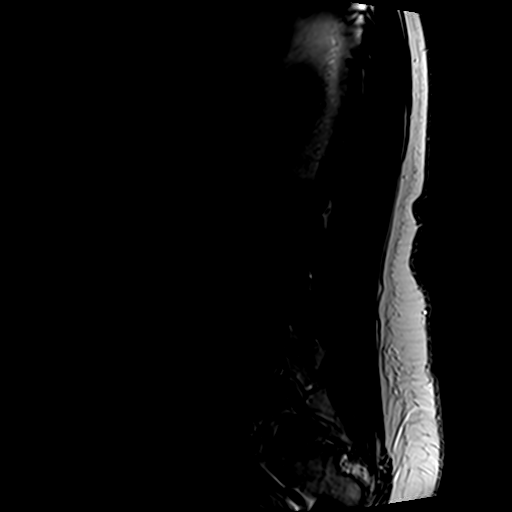

[Series 4: T1 · sagittal · 4.0mm · 0.55mm/px · 6 of 13 slices shown (1 of 2)]
[im 1/13]
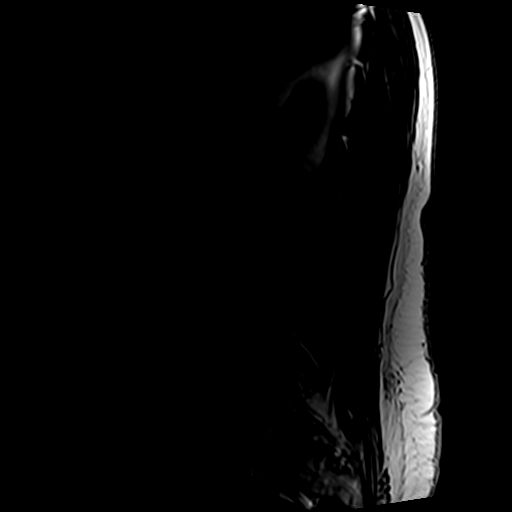
[im 3/13]
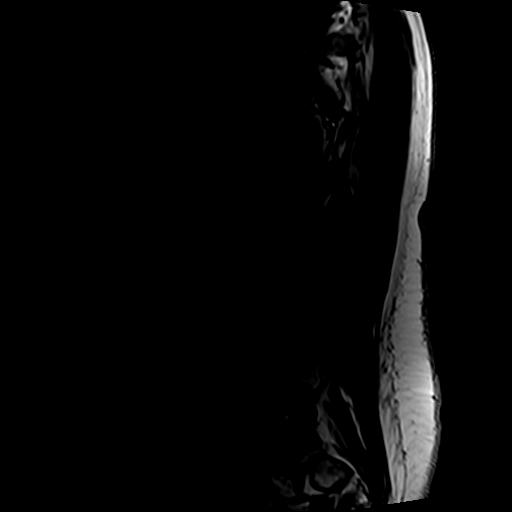
[im 5/13]
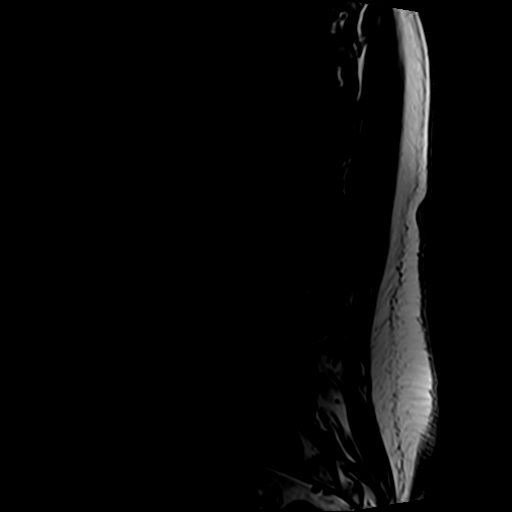
[im 8/13]
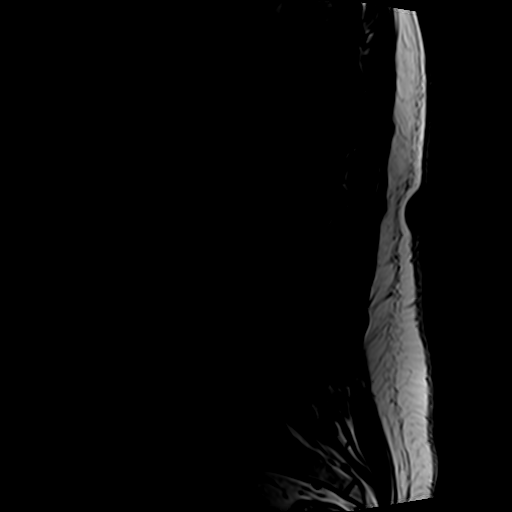
[im 10/13]
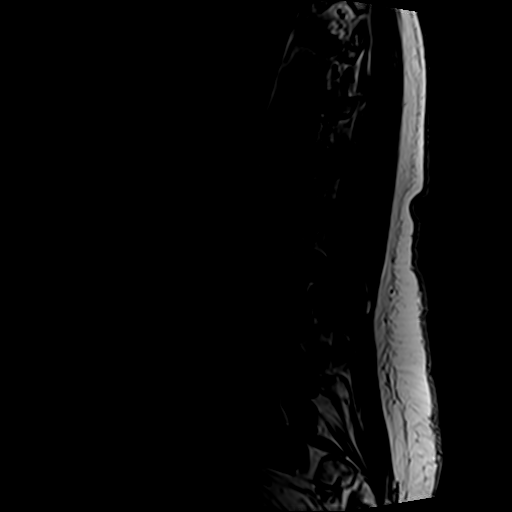
[im 13/13]
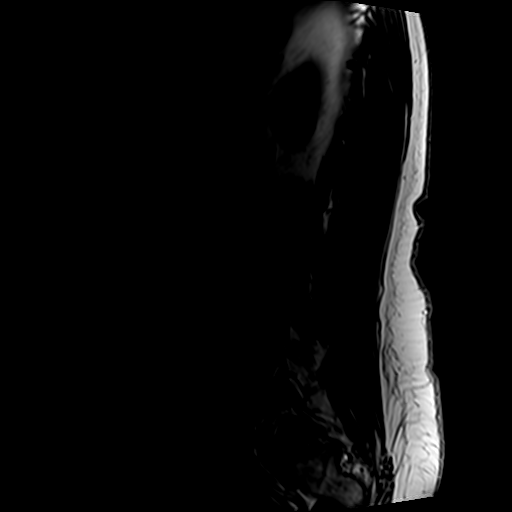

[Series 5: T2 · axial · 4.0mm · 0.70mm/px · z∈[-126,+76]mm · 10 of 37 slices shown (2 of 2)]
[im 3/37]
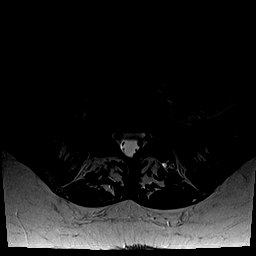
[im 5/37]
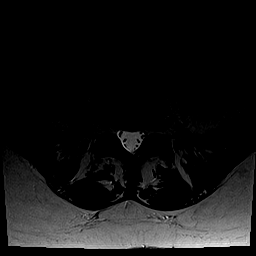
[im 8/37]
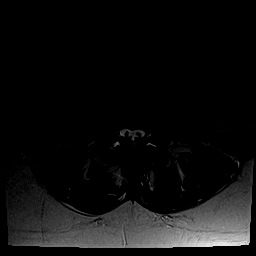
[im 13/37]
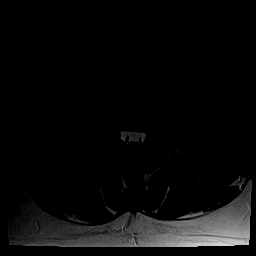
[im 17/37]
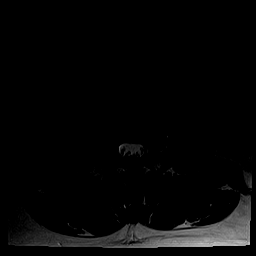
[im 20/37]
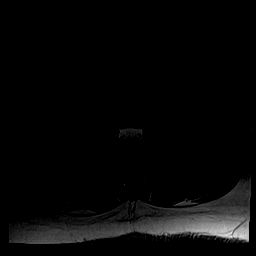
[im 22/37]
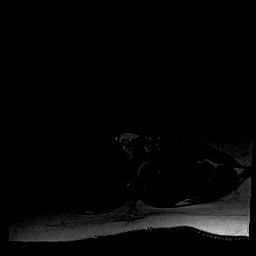
[im 27/37]
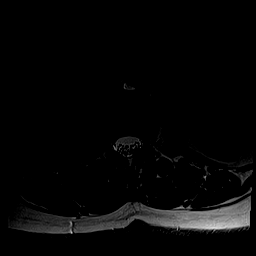
[im 32/37]
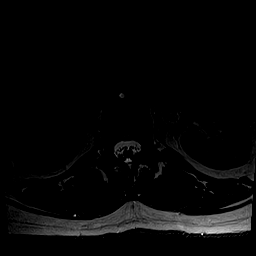
[im 37/37]
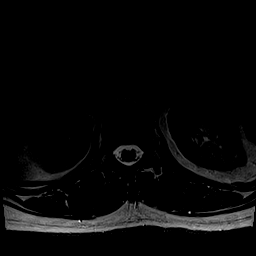

[Series 6: T1 · axial · 4.0mm · 0.35mm/px · z∈[-126,+51]mm · 5 of 37 slices shown (2 of 2)]
[im 3/37]
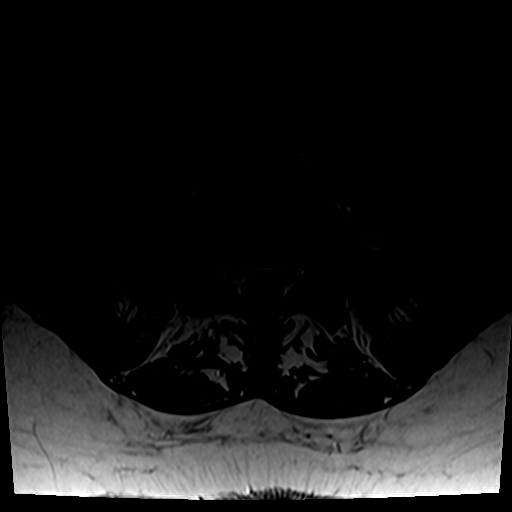
[im 5/37]
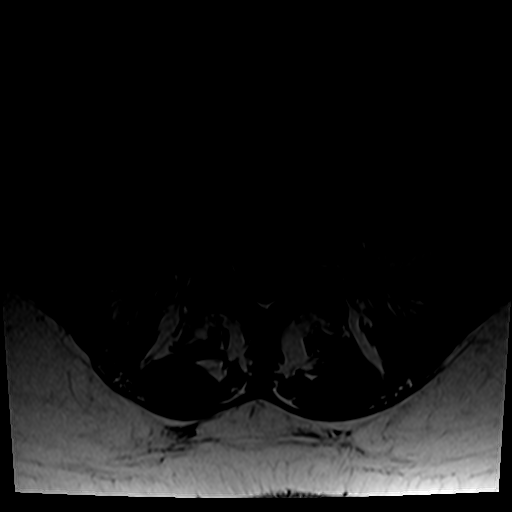
[im 8/37]
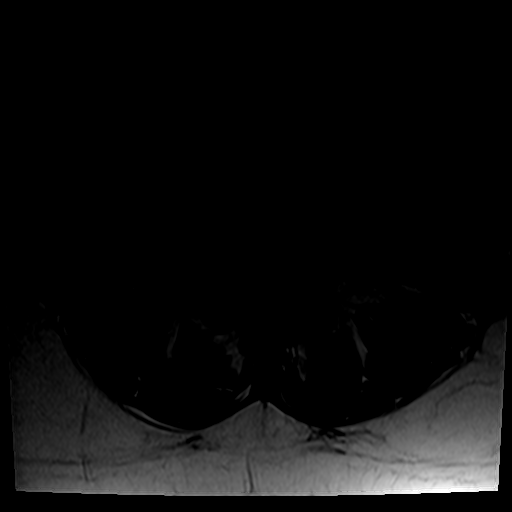
[im 20/37]
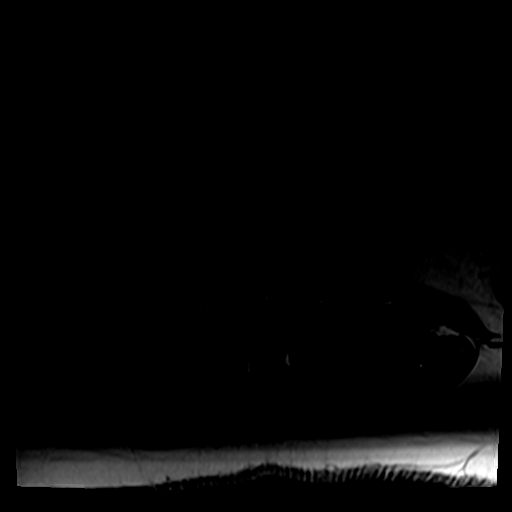
[im 32/37]
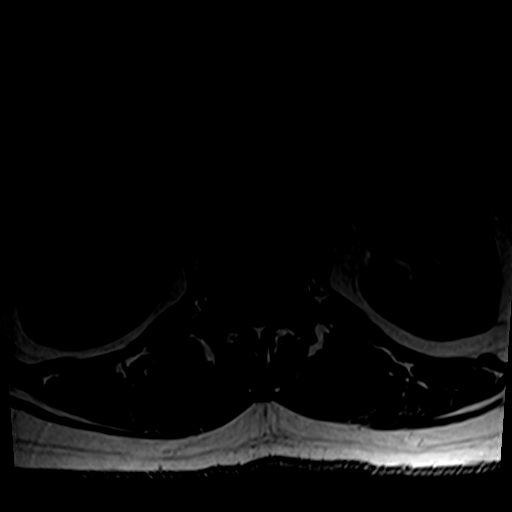

[26 of 48 positions shown; findings below may reference images not displayed]

FINDINGS: Segmentation: Normal. The lowest disc space is considered to be
L5-S1.

Alignment:  Grade 1 L4-5 anterolisthesis.

Vertebrae: No acute compression fracture, discitis-osteomyelitis of
focal marrow lesion.

Conus medullaris and cauda equina: The conus medullaris terminates
at the L1 level. The cauda equina and conus medullaris are both
normal.

Paraspinal and other soft tissues: The visualized aorta, IVC and
iliac vessels are normal. The visualized retroperitoneal organs and
paraspinal soft tissues are normal.

Disc levels: Sagittal plane imaging includes the T10-11 disc level
through the upper sacrum, with axial imaging of the T12-L1 to L5-S1
disc levels.

T10-11 and T11-12 are normal.

T12-L1: Normal.

L1-L2: Normal.

L2-L3: Mild disc bulge without spinal canal or neural foraminal
stenosis.

L3-L4: Small disc bulge. No spinal canal or neural foraminal
stenosis. Normal facets.

L4-L5: Severe facet hypertrophy causing grade 1 anterolisthesis.
There is mild widening of the right facet. Fluid in both facet
joints. There is mild spinal canal stenosis. No neural foraminal
stenosis.

L5-S1: Normal.

The visualized portion of the sacrum is normal.
IMPRESSION: 1. Severe L4-5 facet arthrosis with grade 1 anterolisthesis and mild
spinal canal stenosis.
2. Otherwise mild degenerative disc disease without stenosis.

## 2020-01-08 ENCOUNTER — Telehealth: Payer: Self-pay | Admitting: Physical Medicine and Rehabilitation

## 2020-01-08 NOTE — Telephone Encounter (Signed)
ok 

## 2020-01-09 NOTE — Telephone Encounter (Signed)
Scheduled for 2/15 at 1445 with driver.

## 2020-01-14 ENCOUNTER — Ambulatory Visit: Payer: Medicare Other | Attending: Internal Medicine

## 2020-01-14 DIAGNOSIS — Z23 Encounter for immunization: Secondary | ICD-10-CM

## 2020-01-14 NOTE — Progress Notes (Signed)
.    Covid-19 Vaccination Clinic  Name:  Michele Duncan    MRN: [id number] DOB: [birth year]  01/14/2020  [name] was observed post Covid-19 immunization for 15 minutes without incidence. She was provided with Vaccine Information Sheet and instruction to access the V-Safe system.   [name] was instructed to call 911 with any severe reactions post vaccine: Marland Kitchen Difficulty breathing  . Swelling of your face and throat  . A fast heartbeat  . A bad rash all over your body  . Dizziness and weakness    Immunizations Administered    Name Date Dose VIS Date Route   Pfizer COVID-19 Vaccine 01/14/2020 11:58 AM 0.3 mL 11/15/2019 Intramuscular   Manufacturer: [place]   Lot: [id number]   [place]: [id number]

## 2020-01-16 ENCOUNTER — Ambulatory Visit: Payer: Medicare Other

## 2020-01-20 ENCOUNTER — Ambulatory Visit: Payer: Self-pay

## 2020-01-20 ENCOUNTER — Ambulatory Visit (INDEPENDENT_AMBULATORY_CARE_PROVIDER_SITE_OTHER): Payer: Medicare Other | Admitting: Physical Medicine and Rehabilitation

## 2020-01-20 ENCOUNTER — Other Ambulatory Visit: Payer: Self-pay

## 2020-01-20 ENCOUNTER — Encounter: Payer: Self-pay | Admitting: Physical Medicine and Rehabilitation

## 2020-01-20 VITALS — BP 142/85 | HR 112

## 2020-01-20 DIAGNOSIS — M47816 Spondylosis without myelopathy or radiculopathy, lumbar region: Secondary | ICD-10-CM | POA: Diagnosis not present

## 2020-01-20 MED ORDER — METHYLPREDNISOLONE ACETATE 80 MG/ML IJ SUSP
40.0000 mg | Freq: Once | INTRAMUSCULAR | Status: AC
  Administered 2020-01-20: 15:00:00 40 mg

## 2020-01-20 NOTE — Progress Notes (Signed)
  Numeric Pain Rating Scale and Functional Assessment Average Pain 5   In the last MONTH (on 0-10 scale) has pain interfered with the following?  1. General activity like being  able to carry out your everyday physical activities such as walking, climbing stairs, carrying groceries, or moving a chair?  Rating(7)   +Driver, -BT, -Dye Allergies. 

## 2020-01-21 ENCOUNTER — Encounter (INDEPENDENT_AMBULATORY_CARE_PROVIDER_SITE_OTHER): Payer: Self-pay | Admitting: Nurse Practitioner

## 2020-01-21 ENCOUNTER — Ambulatory Visit (INDEPENDENT_AMBULATORY_CARE_PROVIDER_SITE_OTHER): Payer: Medicare Other | Admitting: Nurse Practitioner

## 2020-01-21 ENCOUNTER — Other Ambulatory Visit (INDEPENDENT_AMBULATORY_CARE_PROVIDER_SITE_OTHER): Payer: Self-pay | Admitting: Internal Medicine

## 2020-01-21 VITALS — BP 140/90 | HR 98 | Temp 97.7°F | Ht 65.0 in | Wt 169.2 lb

## 2020-01-21 DIAGNOSIS — Z0000 Encounter for general adult medical examination without abnormal findings: Secondary | ICD-10-CM | POA: Diagnosis not present

## 2020-01-21 NOTE — Procedures (Signed)
Lumbar Facet Joint Intra-Articular Injection(s) with Fluoroscopic Guidance  Patient: Michele Duncan      Date of Birth: June 10, 1951 MRN: [id number] PCP: [name], MD      Visit Date: 01/20/2020   Universal Protocol:    Date/Time: 01/20/2020  Consent Given By: the patient  Position: PRONE   Additional Comments: Vital signs were monitored before and after the procedure. Patient was prepped and draped in the usual sterile fashion. The correct patient, procedure, and site was verified.   Injection Procedure Details:  Procedure Site One Meds Administered:  Meds ordered this encounter  Medications  . methylPREDNISolone acetate (DEPO-MEDROL) injection 40 mg     Laterality: Bilateral  Location/Site:  L4-L5  Needle size: 22 guage  Needle type: Spinal  Needle Placement: Articular  Findings:  -Comments: Excellent flow of contrast producing a partial arthrogram.  Procedure Details: The fluoroscope beam is vertically oriented in AP, and the inferior recess is visualized beneath the lower pole of the inferior apophyseal process, which represents the target point for needle insertion. When direct visualization is difficult the target point is located at the medial projection of the vertebral pedicle. The region overlying each aforementioned target is locally anesthetized with a 1 to 2 ml. volume of 1% Lidocaine without Epinephrine.   The spinal needle was inserted into each of the above mentioned facet joints using biplanar fluoroscopic guidance. A 0.25 to 0.5 ml. volume of Isovue-250 was injected and a partial facet joint arthrogram was obtained. A single spot film was obtained of the resulting arthrogram.    One to 1.25 ml of the steroid/anesthetic solution was then injected into each of the facet joints noted above.   Additional Comments:  The patient tolerated the procedure well Dressing: 2 x 2 sterile gauze and Band-Aid    Post-procedure details: Patient was observed  during the procedure. Post-procedure instructions were reviewed.  Patient left the clinic in stable condition.

## 2020-01-21 NOTE — Progress Notes (Signed)
Michele Duncan - 69 y.o. female MRN [id number]  Date of birth: May 28, 1951  Office Visit Note: Visit Date: 01/20/2020 PCP: [name], MD Referred by: [name], MD  Subjective: Chief Complaint  Patient presents with  . Lower Back - Pain   HPI: Michele Duncan is a 69 y.o. female who comes in today For planned repeat intra-articular facet joint block at L4-5 bilaterally.  Patient is having bilateral low back pain some referral into the hips to the side laterally but nothing down the legs.  No paresthesias no focal weakness.  I last saw the patient a year ago and completed this injection with really good relief for many months.  Her MRI findings show facet joint arthritis with listhesis but no real stenosis.  She has no real radicular complaints.  She has had nothing since I saw her last time in terms of trauma or anything new.  Pain is worse with standing and ambulating as well as going from sit to stand.  She has pain on exam today with facet loading and good distal strength.  She would be a candidate potentially for radiofrequency ablation but the intra-articular injection lasted so long we can repeat that today.  We talked about core strengthening and weight loss etc.  ROS Otherwise per HPI.  Assessment & Plan: Visit Diagnoses:  1. Spondylosis without myelopathy or radiculopathy, lumbar region     Plan: No additional findings.   Meds & Orders:  Meds ordered this encounter  Medications  . methylPREDNISolone acetate (DEPO-MEDROL) injection 40 mg    Orders Placed This Encounter  Procedures  . Facet Injection  . XR C-ARM NO REPORT    Follow-up: Return if symptoms worsen or fail to improve.   Procedures: No procedures performed  Lumbar Facet Joint Intra-Articular Injection(s) with Fluoroscopic Guidance  Patient: Michele Duncan      Date of Birth: 11-Mar-1951 MRN: [id number] PCP: [name], MD      Visit Date: 01/20/2020   Universal Protocol:    Date/Time:  01/20/2020  Consent Given By: the patient  Position: PRONE   Additional Comments: Vital signs were monitored before and after the procedure. Patient was prepped and draped in the usual sterile fashion. The correct patient, procedure, and site was verified.   Injection Procedure Details:  Procedure Site One Meds Administered:  Meds ordered this encounter  Medications  . methylPREDNISolone acetate (DEPO-MEDROL) injection 40 mg     Laterality: Bilateral  Location/Site:  L4-L5  Needle size: 22 guage  Needle type: Spinal  Needle Placement: Articular  Findings:  -Comments: Excellent flow of contrast producing a partial arthrogram.  Procedure Details: The fluoroscope beam is vertically oriented in AP, and the inferior recess is visualized beneath the lower pole of the inferior apophyseal process, which represents the target point for needle insertion. When direct visualization is difficult the target point is located at the medial projection of the vertebral pedicle. The region overlying each aforementioned target is locally anesthetized with a 1 to 2 ml. volume of 1% Lidocaine without Epinephrine.   The spinal needle was inserted into each of the above mentioned facet joints using biplanar fluoroscopic guidance. A 0.25 to 0.5 ml. volume of Isovue-250 was injected and a partial facet joint arthrogram was obtained. A single spot film was obtained of the resulting arthrogram.    One to 1.25 ml of the steroid/anesthetic solution was then injected into each of the facet joints noted above.   Additional  Comments:  The patient tolerated the procedure well Dressing: 2 x 2 sterile gauze and Band-Aid    Post-procedure details: Patient was observed during the procedure. Post-procedure instructions were reviewed.  Patient left the clinic in stable condition.     Clinical History: MRI LUMBAR SPINE WITHOUT CONTRAST  TECHNIQUE: Multiplanar, multisequence MR imaging of the lumbar  spine was performed. No intravenous contrast was administered.  COMPARISON:  None.  FINDINGS: Segmentation: Normal. The lowest disc space is considered to be L5-S1.  Alignment:  Grade 1 L4-5 anterolisthesis.  Vertebrae: No acute compression fracture, discitis-osteomyelitis of focal marrow lesion.  Conus medullaris and cauda equina: The conus medullaris terminates at the L1 level. The cauda equina and conus medullaris are both normal.  Paraspinal and other soft tissues: The visualized aorta, IVC and iliac vessels are normal. The visualized retroperitoneal organs and paraspinal soft tissues are normal.  Disc levels: Sagittal plane imaging includes the T10-11 disc level through the upper sacrum, with axial imaging of the T12-L1 to L5-S1 disc levels.  T10-11 and T11-12 are normal.  T12-L1: Normal.  L1-L2: Normal.  L2-L3: Mild disc bulge without spinal canal or neural foraminal stenosis.  L3-L4: Small disc bulge. No spinal canal or neural foraminal stenosis. Normal facets.  L4-L5: Severe facet hypertrophy causing grade 1 anterolisthesis. There is mild widening of the right facet. Fluid in both facet joints. There is mild spinal canal stenosis. No neural foraminal stenosis.  L5-S1: Normal.  The visualized portion of the sacrum is normal.  IMPRESSION: 1. Severe L4-5 facet arthrosis with grade 1 anterolisthesis and mild spinal canal stenosis. 2. Otherwise mild degenerative disc disease without stenosis.   Electronically Signed   By: Ulyses Jarred M.D.   On: 09/24/2018 01:21   She reports that she quit smoking about 6 years ago. Her smoking use included cigarettes. She has never used smokeless tobacco. No results for input(s): HGBA1C, LABURIC in the last 8760 hours.  Objective:  VS:  HT:    WT:   BMI:     BP:(!) 142/85  HR:(!) 112bpm  TEMP: ( )  RESP:  Physical Exam  Ortho Exam Imaging: XR C-ARM NO REPORT  Result Date: 01/20/2020 Please  see Notes tab for imaging impression.   Past Medical/Family/Surgical/Social History: Medications & Allergies reviewed per EMR, new medications updated. Patient Active Problem List   Diagnosis Date Noted  . Essential hypertension, benign 10/17/2019  . Hot flashes due to menopause 10/17/2019  . Vitamin D deficiency disease 10/17/2019  . HLD (hyperlipidemia) 10/17/2019  . GERD (gastroesophageal reflux disease) 10/17/2019  . Chronic posterior anal fissure 02/28/2018  . External hemorrhoids with pain 02/28/2018  . Rectal bleeding 11/02/2017   Past Medical History:  Diagnosis Date  . Anxiety   . Arthritis    low back  . Chronic anal fissure   . Chronic low back pain   . Essential hypertension, benign 10/17/2019  . GERD (gastroesophageal reflux disease) 10/17/2019  . Grade II internal hemorrhoids   . Hemorrhoids, external   . History of adenomatous polyp of colon    11-06-2017  . HLD (hyperlipidemia) 10/17/2019  . Hot flashes due to menopause 10/17/2019  . HTN (hypertension)   . Vitamin D deficiency disease 10/17/2019  . Wears glasses    Family History  Problem Relation Age of Onset  . Colon cancer Paternal Grandmother   . Cancer Mother   . Hypertension Father   . Heart disease Father   . Autoimmune disease Sister   . Lung  disease Sister   . Colon polyps Neg Hx    Past Surgical History:  Procedure Laterality Date  . BREAST BIOPSY Left 1970s   benign   . COLONOSCOPY N/A 11/06/2017   Procedure: COLONOSCOPY;  Surgeon: Danie Binder, MD;  Location[facility];  Service: Endoscopy;  Laterality: N/A;  1:45pm  . HEMORRHOID SURGERY N/A 02/28/2018   Procedure: HEMORRHOIDECTOMY;  Surgeon: Michael Boston, MD;  Location[facility];  Service: General;  Laterality: N/A;  . SPHINCTEROTOMY N/A 02/28/2018   Procedure: LATERAL SPHINCTEROTOMY;  Surgeon: Michael Boston, MD;  Location: [facility];  Service: General;  Laterality: N/A;   Social History    Occupational History  . Not on file  Tobacco Use  . Smoking status: Former Smoker    Types: Cigarettes    Quit date: 12/05/2013    Years since quitting: 6.1  . Smokeless tobacco: Never Used  Substance and Sexual Activity  . Alcohol use: No  . Drug use: No  . Sexual activity: Yes

## 2020-01-21 NOTE — Patient Instructions (Signed)
Thank you for choosing [facility] as your medical provider! If you have any questions or concerns regarding your health care, please do not hesitate to call our office.  We completed your Medicare Annual Wellness Exam Today.  Please refer below for recommended scheduling regarding immunizations and health screening.  Immunizations: 1.  Flu: You will be due for your flu shot next flu season 2.  Pneumonia: You will be due for Pneumovax 23 anytime after 10/16/2020 3.  Tetanus: You will be due for tetanus booster anytime after 10/16/2029 4.  Shingles: If you are interested in receiving the shingles vaccine discuss this with your pharmacist 5.  Covid: Follow-up as scheduled for your second dose of the COVID-19 vaccine.  Screenings: 1.  Colon cancer: You will be due for routine screening again in December of this year. 2.  Breast cancer: You are due for breast cancer screening via mammography.  Please let us know when you are ready to undergo this test. 3.  Depression: You will be due for depression screening again in 1 year 4.  Falls: You will be due for fall screening again in 1 year 5.  Osteoporosis: You would qualify for bone density scan to screen for osteoporosis at any time.  Please let us know if you would like me to send you for this test.  Please follow-up as scheduled in 3 months. We look forward to seeing you again soon!   At [facility] we value your feedback. You may receive a survey about your visit today. Please share your experience as we strive to create trusting relationships with our patients to provide genuine, compassionate, quality care.  We appreciate your understanding and patience as we review any laboratory studies, imaging, and other diagnostic tests that are ordered as we care for you. We do our best to address any and all results in a timely manner. If you do not hear about test results within 1 week, please do not hesitate to contact us. If we  referred you to a specialist during your visit or ordered imaging testing, contact the office if you have not been contacted to be scheduled within 1 weeks.  We also encourage the use of MyChart, which contains your medical information for your review as well. If you are not enrolled in this feature, an access code is on this after visit summary for your convenience. Thank you for allowing [place] to be involved in your care.

## 2020-01-21 NOTE — Progress Notes (Signed)
Subjective:   Michele Duncan is a 69 y.o. female who presents for Medicare Annual (Subsequent) preventive examination.  Review of Systems:  All systems reviewed and negative Cardiac Risk Factors include: none     Objective:     Vitals: BP 140/90 (BP Location: Right Arm, Patient Position: Sitting, Cuff Size: Normal)   Pulse 98   Temp 97.7 F (36.5 C) (Temporal)   Ht 5\' 5"  (1.651 m)   Wt 169 lb 3.2 oz (76.7 kg)   SpO2 98%   BMI 28.16 kg/m   Body mass index is 28.16 kg/m.  Advanced Directives 01/21/2020 02/28/2018 11/06/2017 06/10/2015  Does Patient Have a Medical Advance Directive? Yes Yes Yes No  Type of Advance Directive Living will;Healthcare Power of Attorney Living will [place];Living will -  Does patient want to make changes to medical advance directive? No - Patient declined No - Patient declined - -  Copy of [place] in Chart? Yes - validated most recent copy scanned in chart (See row information) - No - copy requested -  Would patient like information on creating a medical advance directive? - - - No - patient declined information    Tobacco Social History   Tobacco Use  Smoking Status Former Smoker  . Types: Cigarettes  . Quit date: 12/05/2013  . Years since quitting: 6.1  Smokeless Tobacco Never Used     Counseling given: Not Answered   Clinical Intake:  Pre-visit preparation completed: Yes  Pain : 0-10 Pain Score: 3  Pain Type: Chronic pain Pain Location: Back Pain Descriptors / Indicators: Aching, Burning Pain Onset: More than a month ago Pain Frequency: Intermittent Pain Relieving Factors: epidural injection 0215/21 Effect of Pain on Daily Activities: somewhat  Pain Relieving Factors: epidural injection 0215/21  Nutritional Status: BMI of 19-24  Normal Diabetes: No  How often do you need to have someone help you when you read instructions, pamphlets, or other written materials from your doctor or pharmacy?:  1 - Never What is the last grade level you completed in school?: 12 grade  Interpreter Needed?: No     Past Medical History:  Diagnosis Date  . Anxiety   . Arthritis    low back  . Chronic anal fissure   . Chronic low back pain   . Essential hypertension, benign 10/17/2019  . GERD (gastroesophageal reflux disease) 10/17/2019  . Grade II internal hemorrhoids   . Hemorrhoids, external   . History of adenomatous polyp of colon    11-06-2017  . HLD (hyperlipidemia) 10/17/2019  . Hot flashes due to menopause 10/17/2019  . HTN (hypertension)   . Vitamin D deficiency disease 10/17/2019  . Wears glasses    Past Surgical History:  Procedure Laterality Date  . BREAST BIOPSY Left 1970s   benign   . COLONOSCOPY N/A 11/06/2017   Procedure: COLONOSCOPY;  Surgeon: Danie Binder, MD;  [facility];  Service: Endoscopy;  Laterality: N/A;  1:45pm  . HEMORRHOID SURGERY N/A 02/28/2018   Procedure: HEMORRHOIDECTOMY;  Surgeon: Michael Boston, MD;  Location[facility];  Service: General;  Laterality: N/A;  . SPHINCTEROTOMY N/A 02/28/2018   Procedure: LATERAL SPHINCTEROTOMY;  Surgeon: Michael Boston, MD;  Location: [place];  Service: General;  Laterality: N/A;   Family History  Problem Relation Age of Onset  . Colon cancer Paternal Grandmother   . Cancer Mother   . Hypertension Father   . Heart disease Father   .  Autoimmune disease Sister   . Lung disease Sister   . Colon polyps Neg Hx    Social History   Socioeconomic History  . Marital status: Married    Spouse name: Not on file  . Number of children: Not on file  . Years of education: Not on file  . Highest education level: Not on file  Occupational History  . Not on file  Tobacco Use  . Smoking status: Former Smoker    Types: Cigarettes    Quit date: 12/05/2013    Years since quitting: 6.1  . Smokeless tobacco: Never Used  Substance and Sexual Activity  . Alcohol use: No  . Drug  use: No  . Sexual activity: Yes  Other Topics Concern  . 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   Social Determinants of Health   Financial Resource Strain:   . Difficulty of Paying Living Expenses: Not on file  Food Insecurity:   . Worried About Charity fundraiser in the Last Year: Not on file  . Ran Out of Food in the Last Year: Not on file  Transportation Needs:   . Lack of Transportation (Medical): Not on file  . Lack of Transportation (Non-Medical): Not on file  Physical Activity:   . Days of Exercise per Week: Not on file  . Minutes of Exercise per Session: Not on file  Stress:   . Feeling of Stress : Not on file  Social Connections:   . Frequency of Communication with Friends and Family: Not on file  . Frequency of Social Gatherings with Friends and Family: Not on file  . Attends Religious Services: Not on file  . Active Member of Clubs or Organizations: Not on file  . Attends Archivist Meetings: Not on file  . Marital Status: Not on file    Outpatient Encounter Medications as of 01/21/2020  Medication Sig  . acetaminophen (TYLENOL) 650 MG CR tablet Take 650 mg by mouth every 8 (eight) hours as needed for pain.  Marland Kitchen aluminum hydroxide-magnesium carbonate (GAVISCON) 95-358 MG/15ML SUSP Take 15 mLs by mouth as needed for indigestion.  . Cholecalciferol (VITAMIN D-3 PO) Take 7,000 Int'l Units by mouth daily.  . diclofenac (VOLTAREN) 75 MG EC tablet TAKE 1 TABLET BY MOUTH TWICE DAILY WITH MEALS.  Marland Kitchen estradiol (ESTRACE) 1 MG tablet Take 1 tablet by mouth once daily  . Estradiol Micronized (EC-RX ESTRADIOL TD) Place 0.5 mLs onto the skin 2 (two) times a week. Compounded cream from [place]  . hydrochlorothiazide (MICROZIDE) 12.5 MG capsule TAKE (1) CAPSULE BY MOUTH ONCE DAILY.  Marland Kitchen losartan (COZAAR) 50 MG tablet TAKE 1 TABLET BY MOUTH ONCE A DAY.  . naproxen (NAPROSYN) 500 MG tablet Take 1  tablet (500 mg total) by mouth 2 (two) times daily with a meal.  . pantoprazole (PROTONIX) 40 MG tablet TAKE ONE TABLET BY MOUTH TWICE A DAY  . potassium chloride SA (K-DUR,KLOR-CON) 20 MEQ tablet Take 40 mEq by mouth every morning.   . progesterone (PROMETRIUM) 200 MG capsule Take 400 mg by mouth every evening.   . thyroid (NP THYROID) 90 MG tablet Take 1 tablet (90 mg total) by mouth daily.   No facility-administered encounter medications on file as of 01/21/2020.    Activities of Daily Living In your present state of health, do you have any difficulty performing the following activities: 01/21/2020  Hearing? N  Vision? N  Difficulty concentrating or making decisions? N  Walking or climbing stairs? Y  Comment climbing stairs due to back pain  Dressing or bathing? N  Doing errands, shopping? N  Preparing Food and eating ? N  Using the Toilet? N  In the past six months, have you accidently leaked urine? N  Do you have problems with loss of bowel control? N  Managing your Medications? N  Managing your Finances? N  Housekeeping or managing your Housekeeping? N  Some recent data might be hidden    Patient Care Team: [name], MD as PCP - General (Internal Medicine) Danie Binder, MD as Consulting Physician (Gastroenterology) Michael Boston, MD as Consulting Physician (General Surgery)    Assessment:   This is a routine wellness examination for Ailaina.  Exercise Activities and Dietary recommendations Current Exercise Habits: The patient does not participate in regular exercise at present, Exercise limited by: orthopedic condition(s)  Goals   None     Fall Risk Fall Risk  01/21/2020  Falls in the past year? 0  Number falls in past yr: 0  Injury with Fall? 1  Risk for fall due to : No Fall Risks  Follow up Falls prevention discussed   Is the patient's home free of loose throw rugs in walkways, pet beds, electrical cords, etc?   yes      Grab bars in the bathroom?  no      Handrails on the stairs?   no - n/a      Adequate lighting?   yes  Timed Get Up and Go performed: 8 seconds  Depression Screen PHQ 2/9 Scores 01/21/2020  PHQ - 2 Score 0     Cognitive Function     6CIT Screen 01/21/2020  What Year? 0 points  What month? 0 points  What time? 3 points  Count back from 20 0 points  Months in reverse 0 points  Repeat phrase 0 points  Total Score 3    Immunization History  Administered Date(s) Administered  . Fluad Quad(high Dose 65+) 09/24/2019  . PFIZER SARS-COV-2 Vaccination 01/14/2020  . Pneumococcal Conjugate-13 10/17/2019  . Tdap 10/17/2019    Qualifies for Shingles Vaccine? Yes  Screening Tests Health Maintenance  Topic Date Due  . MAMMOGRAM  11/17/2019  . PNA vac Low Risk Adult (2 of 2 - PPSV23) 10/16/2020  . COLONOSCOPY  11/07/2027  . TETANUS/TDAP  10/16/2029  . INFLUENZA VACCINE  Completed  . DEXA SCAN  Completed  . Hepatitis C Screening  Completed    Cancer Screenings: Lung: Low Dose CT Chest recommended if Age 63-80 years, 30 pack-year currently smoking OR have quit w/in 15years. Patient does not qualify. Breast:  Up to date on Mammogram? No - hold of on scheduling per patient preference   Up to date of Bone Density/Dexa? No - hold off on scheduling per patient preference Colorectal: Due in December 11/2020  Additional Screenings: Hepatitis C Screening: completed     Plan:   She will be due for flu shot next flu season.  She will be due for Pneumovax 23 anytime after October 16, 2020.  She will be due for tetanus booster again in November 2030.  I told her she is interested in the shingles vaccine she discuss this with her pharmacist, but I recommended that she avoid getting the vaccine until at least 2 weeks after getting her second COVID-19 vaccine.  I also told her to follow-up as scheduled for her second dose of the  COVID-19 vaccine later on this month.  She tells me she will.  She will be due for colon  cancer screening again in December of this year with Dr. Oneida Alar.  She is due for breast cancer screening via mammography, but tells me she had a recent procedure performed on her breast and would like to wait for this area to heal prior to undergoing mammography.  I told her to let me know when she is ready for this procedure.  Depression screening was negative.  She is a low fall risk.  She would qualify for repeat osteoporosis screening via bone density scan, but she would like to hold off on this for now.  She does have advanced care planning in place and does not wish to make any changes.  I have personally reviewed and noted the following in the patient's chart:   . Medical and social history . Use of alcohol, tobacco or illicit drugs  . Current medications and supplements . Functional ability and status . Nutritional status . Physical activity . Advanced directives . List of other physicians . Hospitalizations, surgeries, and ER visits in previous 12 months . Vitals . Screenings to include cognitive, depression, and falls . Referrals and appointments  In addition, I have reviewed and discussed with patient certain preventive protocols, quality metrics, and best practice recommendations. A written personalized care plan for preventive services as well as general preventive health recommendations were provided to patient.    She will follow-up as scheduled in approximately 3 months. Rodney Langton, [place]  01/21/2020

## 2020-01-22 ENCOUNTER — Telehealth (INDEPENDENT_AMBULATORY_CARE_PROVIDER_SITE_OTHER): Payer: Self-pay | Admitting: Internal Medicine

## 2020-01-22 DIAGNOSIS — N951 Menopausal and female climacteric states: Secondary | ICD-10-CM

## 2020-01-22 MED ORDER — ESTRADIOL 1 MG PO TABS: 1.0000 mg | ORAL_TABLET | Freq: Every day | ORAL | 0 refills | Status: DC

## 2020-01-22 NOTE — Telephone Encounter (Signed)
I see 2 different forms of estradiol listed on her chart.  I will refill the estradiol that she can take by mouth, as this is the more common route that Dr. Anastasio Champion orders.  I have sent this refill to [facility].

## 2020-01-27 ENCOUNTER — Other Ambulatory Visit (INDEPENDENT_AMBULATORY_CARE_PROVIDER_SITE_OTHER): Payer: Self-pay

## 2020-01-27 DIAGNOSIS — N951 Menopausal and female climacteric states: Secondary | ICD-10-CM

## 2020-01-27 MED ORDER — HYDROCHLOROTHIAZIDE 12.5 MG PO CAPS: ORAL_CAPSULE | ORAL | 0 refills | Status: DC

## 2020-01-27 MED ORDER — NAPROXEN 500 MG PO TABS: 500.0000 mg | ORAL_TABLET | Freq: Two times a day (BID) | ORAL | 1 refills | Status: DC

## 2020-01-27 MED ORDER — ESTRADIOL 1 MG PO TABS: 1.0000 mg | ORAL_TABLET | Freq: Every day | ORAL | 2 refills | Status: DC

## 2020-01-27 MED ORDER — LOSARTAN POTASSIUM 50 MG PO TABS: 50.0000 mg | ORAL_TABLET | Freq: Every day | ORAL | 0 refills | Status: DC

## 2020-01-27 MED ORDER — THYROID 90 MG PO TABS: 90.0000 mg | ORAL_TABLET | Freq: Every day | ORAL | 3 refills | Status: DC

## 2020-01-27 NOTE — Telephone Encounter (Signed)
Send meds two different ways: BP, Naprosyn to [facility] & hormones to [facility].

## 2020-02-03 ENCOUNTER — Other Ambulatory Visit (INDEPENDENT_AMBULATORY_CARE_PROVIDER_SITE_OTHER): Payer: Self-pay | Admitting: Internal Medicine

## 2020-02-03 MED ORDER — NAPROXEN 500 MG PO TABS: 500.0000 mg | ORAL_TABLET | Freq: Two times a day (BID) | ORAL | 0 refills | Status: DC

## 2020-02-03 MED ORDER — HYDROCHLOROTHIAZIDE 12.5 MG PO CAPS: ORAL_CAPSULE | ORAL | 0 refills | Status: DC

## 2020-02-03 MED ORDER — LOSARTAN POTASSIUM 50 MG PO TABS: 50.0000 mg | ORAL_TABLET | Freq: Every day | ORAL | 0 refills | Status: DC

## 2020-02-08 ENCOUNTER — Ambulatory Visit: Payer: Medicare Other | Attending: Internal Medicine

## 2020-02-08 DIAGNOSIS — Z23 Encounter for immunization: Secondary | ICD-10-CM | POA: Insufficient documentation

## 2020-02-08 NOTE — Progress Notes (Signed)
   Covid-19 Vaccination Clinic  Name:  Michele Duncan    MRN: [id number] DOB: [birth year]  02/08/2020  [name] was observed post Covid-19 immunization for 15 minutes without incident. She was provided with Vaccine Information Sheet and instruction to access the V-Safe system.   [name] was instructed to call 911 with any severe reactions post vaccine: Marland Kitchen Difficulty breathing  . Swelling of face and throat  . A fast heartbeat  . A bad rash all over body  . Dizziness and weakness   Immunizations Administered    Name Date Dose VIS Date Route   Pfizer COVID-19 Vaccine 02/08/2020  9:02 AM 0.3 mL 11/15/2019 Intramuscular   Manufacturer: [place]   Lot: [id number]   [place]: [id number]

## 2020-02-18 ENCOUNTER — Telehealth: Payer: Self-pay | Admitting: Physical Medicine and Rehabilitation

## 2020-02-18 DIAGNOSIS — M7918 Myalgia, other site: Secondary | ICD-10-CM

## 2020-02-18 NOTE — Telephone Encounter (Signed)
I feel like with the pain shooting up she might need course of PT for myofascial pain. He spine looks great except for the L4-5 level, could seek surgical eval.

## 2020-02-18 NOTE — Telephone Encounter (Signed)
Called patient to advise. She wants to try PT. Referral placed.

## 2020-02-19 ENCOUNTER — Other Ambulatory Visit: Payer: Self-pay

## 2020-02-19 ENCOUNTER — Ambulatory Visit (HOSPITAL_COMMUNITY): Payer: Medicare Other | Attending: Physical Medicine and Rehabilitation | Admitting: Physical Therapy

## 2020-02-19 ENCOUNTER — Encounter (HOSPITAL_COMMUNITY): Payer: Self-pay | Admitting: Physical Therapy

## 2020-02-19 DIAGNOSIS — M545 Low back pain, unspecified: Secondary | ICD-10-CM

## 2020-02-19 DIAGNOSIS — M6281 Muscle weakness (generalized): Secondary | ICD-10-CM | POA: Diagnosis not present

## 2020-02-19 DIAGNOSIS — R2689 Other abnormalities of gait and mobility: Secondary | ICD-10-CM | POA: Diagnosis not present

## 2020-02-19 NOTE — Therapy (Signed)
[place], [place], [address] Phone: [phone]   Fax:  [phone]  Physical Therapy Evaluation  Patient Details  Name: Michele Duncan MRN: [id number] Date of Birth: 09-17-1951 Referring Provider (PT): Magnus Sinning MD   Encounter Date: 02/19/2020  PT End of Session - 02/19/20 1445    Visit Number  1    Number of Visits  12    Date for PT Re-Evaluation  04/01/20    Authorization Type  Primary: Medicare Secondary: [employer]    Progress Note Due on Visit  10    PT Start Time  1345    PT Stop Time  1440    PT Time Calculation (min)  55 min    Activity Tolerance  Patient tolerated treatment well;Patient limited by pain    Behavior During Therapy  [facility] for tasks assessed/performed       Past Medical History:  Diagnosis Date  . Anxiety   . Arthritis    low back  . Chronic anal fissure   . Chronic low back pain   . Essential hypertension, benign 10/17/2019  . GERD (gastroesophageal reflux disease) 10/17/2019  . Grade II internal hemorrhoids   . Hemorrhoids, external   . History of adenomatous polyp of colon    11-06-2017  . HLD (hyperlipidemia) 10/17/2019  . Hot flashes due to menopause 10/17/2019  . HTN (hypertension)   . Vitamin D deficiency disease 10/17/2019  . Wears glasses     Past Surgical History:  Procedure Laterality Date  . BREAST BIOPSY Left 1970s   benign   . COLONOSCOPY N/A 11/06/2017   Procedure: COLONOSCOPY;  Surgeon: Danie Binder, MD;  [facility];  Service: Endoscopy;  Laterality: N/A;  1:45pm  . HEMORRHOID SURGERY N/A 02/28/2018   Procedure: HEMORRHOIDECTOMY;  Surgeon: Michael Boston, MD;  Location: [facility];  Service: General;  Laterality: N/A;  . SPHINCTEROTOMY N/A 02/28/2018   Procedure: LATERAL SPHINCTEROTOMY;  Surgeon: Michael Boston, MD;  Location: [place];  Service: General;  Laterality: N/A;    There were no vitals  filed for this visit.   Subjective Assessment - 02/19/20 1344    Subjective  Patient is a 69 y.o. female who presents to physical therapy with c/o low back pain and back stiffness. She was putting a towel on her head and she felt a sharp pain go up from her coccyx into low back and left buttock. She feels that a muscle has been strained. She has arthritis in her low back with long history of low back pain. Bending over and lifting legs up are painful. She has noticed LE weakness. She denies changes in bowel/ bladder function or numbness tingling in LE/groin area.  She notes decreased pain with heat and ice. She has had difficulty getting comfortable in bed. Her main goal is to get back to her daily things like taking care of her husband.    Limitations  Sitting;Lifting;Standing;House hold activities    How long can you sit comfortably?  unable    How long can you stand comfortably?  5 minutes    How long can you walk comfortably?  5 minutes    Patient Stated Goals  To get back to doing her daily chores like normal    Currently in Pain?  Yes    Pain Score  6     Pain Location  Back    Pain Orientation  Lower  Pain Descriptors / Indicators  Simonne Martinet PT Assessment - 02/19/20 0001      Assessment   Medical Diagnosis  Myofascial pain/Low back pain    Referring Provider (PT)  Magnus Sinning MD    Onset Date/Surgical Date  02/13/20    Next MD Visit  none    Prior Therapy  none      Precautions   Precautions  None      Restrictions   Weight Bearing Restrictions  No      Balance Screen   Has the patient fallen in the past 6 months  No    Has the patient had a decrease in activity level because of a fear of falling?   Yes    Is the patient reluctant to leave their home because of a fear of falling?   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, family      Cognition   Overall Cognitive  Status  Within Functional Limits for tasks assessed      Observation/Other Assessments   Observations  Rigid posture throughout session, ambualtes without AD with limited trunk movement, constant changing position while seated      Sensation   Light Touch  Appears Intact      Posture/Postural Control   Posture Comments  slouched, constant positional changes, frequent lean to L/R      ROM / Strength   AROM / PROM / Strength  AROM;Strength      AROM   Overall AROM   Deficits;Due to pain    Overall AROM Comments  repeated extension in prone: min decrease in symptoms; repeated extension in standing: possible min decrease in symptoms    AROM Assessment Site  Lumbar    Lumbar Flexion  50% limited *pain    Lumbar Extension  25% limited (feels good)    Lumbar - Right Side Bend  25% limited    Lumbar - Left Side Bend  75% limited *pain     Lumbar - Right Rotation  0% limited    Lumbar - Left Rotation  0% limited      Strength   Overall Strength  Deficits    Overall Strength Comments  deficits primarily at hip    Strength Assessment Site  Hip;Knee;Ankle    Right/Left Hip  Right;Left    Right Hip Flexion  4/5   pain in low back worse than L   Left Hip Flexion  4-/5   pain   Right/Left Knee  Right;Left    Right Knee Flexion  4+/5    Right Knee Extension  4+/5    Left Knee Flexion  4+/5    Left Knee Extension  4+/5    Right/Left Ankle  Right;Left    Right Ankle Dorsiflexion  5/5    Left Ankle Dorsiflexion  4/5      Palpation   Spinal mobility  grossly normal, Tender and hypomobile lower lumbar    SI assessment   PA normal    Palpation comment  No tenerness paraspinals in lumbar and thoracic despite being min/mod hypertonic, TTP bilateral glutes, L QL no tenderness bilateral PSIS      Transfers   Five time sit to stand comments   29.15      Ambulation/Gait   Ambulation/Gait  Yes    Ambulation/Gait  Assistance  6: Modified independent (Device/Increase time)    Ambulation  Distance (Feet)  235 Feet    Assistive device  None    Gait Pattern  Decreased arm swing - right;Decreased arm swing - left   decreased truncal movement   Ambulation Surface  Level;Indoor    Gait velocity  decreased/slow    Gait Comments  2MWT                Objective measurements completed on examination: See above findings.      [place] Adult PT Treatment/Exercise - 02/19/20 0001      Exercises   Exercises  Lumbar      Lumbar Exercises: Standing   Other Standing Lumbar Exercises  standing lumbar extensions 2x10      Lumbar Exercises: Prone   Other Prone Lumbar Exercises  prone press up 1x10              PT Education - 02/19/20 1444    Education Details  Patient educated on exam findings, POC, scope of PT, initial HEP    Person(s) Educated  Patient    Methods  Explanation;Handout    Comprehension  Verbalized understanding       PT Short Term Goals - 02/19/20 1509      PT SHORT TERM GOAL #1   Title  Patient will be independent with HEP in order to improve functional outcomes.    Time  3    Period  Weeks    Status  New    Target Date  03/11/20      PT SHORT TERM GOAL #2   Title  Patient will report at least 25% improvement in symptoms for improved quality of life.    Time  3    Period  Weeks    Status  New    Target Date  03/11/20        PT Long Term Goals - 02/19/20 1509      PT LONG TERM GOAL #1   Title  Patient will report at least 75% improvement in symptoms for improved quality of life.    Time  3    Period  Weeks    Status  New    Target Date  04/01/20      PT LONG TERM GOAL #2   Title  Patient will be able to perform all ADL unrestricted for improved ability to take care of her family.    Time  6    Period  Weeks    Status  New    Target Date  04/01/20      PT LONG TERM GOAL #3   Title  Patient will be able to ambulate for at least 15 mins with symptoms no greater than 2/10 for improved tolerance to activity.    Time  6     Period  Weeks    Status  New    Target Date  04/01/20      PT LONG TERM GOAL #4   Title  Patient will be able to complete 5x STS in under 11.4 seconds in order to reduce the risk of falls.    Time  6    Period  Weeks    Status  New    Target Date  04/01/20             Plan - 02/19/20 1446    Clinical Impression Statement  Patient is a 69 y.o. female who presents to physical therapy with  low back pain and back stiffness. She presents with pain limited deficits in lumbar and LE strength, ROM, endurance, postural impairments, spinal mobility and functional mobility with ADL. She is having to modify and restrict ADL as indicated by subjective information and objective measures which is affecting overall participation. Patient has had reduced participation with ADL affecting ability to take care of family. Patient will benefit from skilled physical therapy in order to improve function and reduce impairment.    Personal Factors and Comorbidities  Behavior Pattern;Comorbidity 2;Fitness;Past/Current Experience    Comorbidities  chronic low back pain, sedentary lifestyle    Examination-Activity Limitations  Bathing;Bed Mobility;Bend;Caring for Others;Carry;Dressing;Lift;Locomotion Level;Sit;Sleep;Squat;Stand;Transfers    Examination-Participation Restrictions  Cleaning;Community Activity;Driving;Interpersonal Relationship;Laundry;Meal Prep;Shop;Yard Work    Stability/Clinical Decision Making  Evolving/Moderate complexity    Clinical Decision Making  Moderate    Rehab Potential  Good    PT Frequency  2x / week    PT Duration  6 weeks    PT Treatment/Interventions  ADLs/Self Care Home Management;Aquatic Therapy;Biofeedback;Cryotherapy;Electrical Stimulation;Iontophoresis 4mg /ml Dexamethasone;Moist Heat;Traction;Ultrasound;DME Instruction;Gait training;Stair training;Functional mobility training;Therapeutic activities;Therapeutic exercise;Balance training;Neuromuscular re-education;Patient/family  education;Orthotic Fit/Training;Manual techniques;Compression bandaging;Scar mobilization;Passive range of motion;Dry needling;Energy conservation;Splinting;Taping;Spinal Manipulations;Joint Manipulations    PT Next Visit Plan  assess response to extension based exercise, begin core and hip strengthening, possibly begin manual therapy, assess balance when able possibly    PT Home Exercise Plan  02/19/20 repeated lumbar extension in standing    Consulted and Agree with Plan of Care  Patient       Patient will benefit from skilled therapeutic intervention in order to improve the following deficits and impairments:  Abnormal gait, Decreased activity tolerance, Decreased balance, Decreased endurance, Decreased mobility, Decreased range of motion, Decreased strength, Difficulty walking, Hypomobility, Improper body mechanics, Postural dysfunction, Obesity, Pain  Visit Diagnosis: Low back pain, unspecified back pain laterality, unspecified chronicity, unspecified whether sciatica present  Other abnormalities of gait and mobility  Muscle weakness (generalized)     Problem List Patient Active Problem List   Diagnosis Date Noted  . Essential hypertension, benign 10/17/2019  . Hot flashes due to menopause 10/17/2019  . Vitamin D deficiency disease 10/17/2019  . HLD (hyperlipidemia) 10/17/2019  . GERD (gastroesophageal reflux disease) 10/17/2019  . Chronic posterior anal fissure 02/28/2018  . External hemorrhoids with pain 02/28/2018  . Rectal bleeding 11/02/2017    3:13 PM, 02/19/20 Mearl Latin PT, DPT Physical Therapist at [facility] [place], [place], [address] Phone: [phone]   Fax:  [phone]  Name: Michele Duncan MRN: [id number] Date of Birth: 19-Jan-1951

## 2020-02-25 ENCOUNTER — Encounter (HOSPITAL_COMMUNITY): Payer: Self-pay | Admitting: Physical Therapy

## 2020-02-25 ENCOUNTER — Other Ambulatory Visit: Payer: Self-pay

## 2020-02-25 ENCOUNTER — Ambulatory Visit (HOSPITAL_COMMUNITY): Payer: Medicare Other | Admitting: Physical Therapy

## 2020-02-25 DIAGNOSIS — M6281 Muscle weakness (generalized): Secondary | ICD-10-CM

## 2020-02-25 DIAGNOSIS — R2689 Other abnormalities of gait and mobility: Secondary | ICD-10-CM

## 2020-02-25 DIAGNOSIS — M545 Low back pain, unspecified: Secondary | ICD-10-CM

## 2020-02-25 NOTE — Therapy (Signed)
[place], [place], [address] Phone: [phone]   Fax:  [phone]  Physical Therapy Treatment  Patient Details  Name: Michele Duncan MRN: [id number] Date of Birth: Apr 12, 1951 Referring Provider (PT): Magnus Sinning MD   Encounter Date: 02/25/2020  PT End of Session - 02/25/20 1317    Visit Number  2    Number of Visits  12    Date for PT Re-Evaluation  04/01/20    Authorization Type  Primary: Medicare Secondary: [employer]    Progress Note Due on Visit  10    PT Start Time  1300    PT Stop Time  1345    PT Time Calculation (min)  45 min    Activity Tolerance  Patient tolerated treatment well    Behavior During Therapy  [facility] for tasks assessed/performed       Past Medical History:  Diagnosis Date  . Anxiety   . Arthritis    low back  . Chronic anal fissure   . Chronic low back pain   . Essential hypertension, benign 10/17/2019  . GERD (gastroesophageal reflux disease) 10/17/2019  . Grade II internal hemorrhoids   . Hemorrhoids, external   . History of adenomatous polyp of colon    11-06-2017  . HLD (hyperlipidemia) 10/17/2019  . Hot flashes due to menopause 10/17/2019  . HTN (hypertension)   . Vitamin D deficiency disease 10/17/2019  . Wears glasses     Past Surgical History:  Procedure Laterality Date  . BREAST BIOPSY Left 1970s   benign   . COLONOSCOPY N/A 11/06/2017   Procedure: COLONOSCOPY;  Surgeon: Danie Binder, MD;  [facility];  Service: Endoscopy;  Laterality: N/A;  1:45pm  . HEMORRHOID SURGERY N/A 02/28/2018   Procedure: HEMORRHOIDECTOMY;  Surgeon: Michael Boston, MD;  Location: [facility];  Service: General;  Laterality: N/A;  . SPHINCTEROTOMY N/A 02/28/2018   Procedure: LATERAL SPHINCTEROTOMY;  Surgeon: Michael Boston, MD;  Location: [facility];  Service: General;  Laterality: N/A;    There were no vitals filed for this  visit.  Subjective Assessment - 02/25/20 1257    Subjective  Patient states her back has been doing a lot better. She has been moving more and was able to cook. She continues to use heat for pain which helps. She has been slacking with her home exercises. She feels like extension exercises have been helpful.    Limitations  Sitting;Lifting;Standing;House hold activities    How long can you sit comfortably?  unable    How long can you stand comfortably?  5 minutes    How long can you walk comfortably?  5 minutes    Patient Stated Goals  To get back to doing her daily chores like normal    Currently in Pain?  Yes    Pain Score  1     Pain Location  Back    Pain Orientation  Lower                       [facility] Adult PT Treatment/Exercise - 02/25/20 0001      Lumbar Exercises: Standing   Other Standing Lumbar Exercises  standing row 3x10 green band      Lumbar Exercises: Supine   Ab Set  20 reps;5 seconds    Clam  15 reps    Clam Limitations  bilateral    Bent Knee Raise  10 reps    Bent Knee Raise Limitations  2 sets bilateral    Bridge  10 reps    Bridge Limitations  2 sets with prior glute contraction             PT Education - 02/25/20 1313    Education Details  Patient educated on posture, lumbar roll, continueing HEP    Person(s) Educated  Patient    Methods  Explanation;Handout    Comprehension  Verbalized understanding       PT Short Term Goals - 02/19/20 1509      PT SHORT TERM GOAL #1   Title  Patient will be independent with HEP in order to improve functional outcomes.    Time  3    Period  Weeks    Status  New    Target Date  03/11/20      PT SHORT TERM GOAL #2   Title  Patient will report at least 25% improvement in symptoms for improved quality of life.    Time  3    Period  Weeks    Status  New    Target Date  03/11/20        PT Long Term Goals - 02/19/20 1509      PT LONG TERM GOAL #1   Title  Patient will report at least  75% improvement in symptoms for improved quality of life.    Time  3    Period  Weeks    Status  New    Target Date  04/01/20      PT LONG TERM GOAL #2   Title  Patient will be able to perform all ADL unrestricted for improved ability to take care of her family.    Time  6    Period  Weeks    Status  New    Target Date  04/01/20      PT LONG TERM GOAL #3   Title  Patient will be able to ambulate for at least 15 mins with symptoms no greater than 2/10 for improved tolerance to activity.    Time  6    Period  Weeks    Status  New    Target Date  04/01/20      PT LONG TERM GOAL #4   Title  Patient will be able to complete 5x STS in under 11.4 seconds in order to reduce the risk of falls.    Time  6    Period  Weeks    Status  New    Target Date  04/01/20            Plan - 02/25/20 1317    Clinical Impression Statement  atient has difficulty completing abdominal set and requires verbal and tactile cueing to be able to complete. She continues to have difficulty following cueing. She is able to complete bridge with minimal low back discomfort and is educated on prior glute contraction for decreasing strain on low back. She is educated on using lumbar roll with sitting. She has difficulty determining what muscles are working while completing exercises but does perform with appropriate mechanics. Patient will benefit from skilled physical therapy in order to improve function and reduce impairment.    Personal Factors and Comorbidities  Behavior Pattern;Comorbidity 2;Fitness;Past/Current Experience    Comorbidities  chronic low back pain, sedentary lifestyle    Examination-Activity Limitations  Bathing;Bed Mobility;Bend;Caring for Others;Carry;Dressing;Lift;Locomotion Level;Sit;Sleep;Squat;Stand;Transfers    Examination-Participation Restrictions  Cleaning;Community Activity;Driving;Interpersonal Relationship;Laundry;Meal Prep;Shop;Yard  Work    Stability/Clinical Decision Making   Evolving/Moderate complexity    Rehab Potential  Good    PT Frequency  2x / week    PT Duration  6 weeks    PT Treatment/Interventions  ADLs/Self Care Home Management;Aquatic Therapy;Biofeedback;Cryotherapy;Electrical Stimulation;Iontophoresis 4mg /ml Dexamethasone;Moist Heat;Traction;Ultrasound;DME Instruction;Gait training;Stair training;Functional mobility training;Therapeutic activities;Therapeutic exercise;Balance training;Neuromuscular re-education;Patient/family education;Orthotic Fit/Training;Manual techniques;Compression bandaging;Scar mobilization;Passive range of motion;Dry needling;Energy conservation;Splinting;Taping;Spinal Manipulations;Joint Manipulations    PT Next Visit Plan  assess response to extension based exercise, begin core and hip strengthening, possibly begin manual therapy, assess balance when able possibly; address getting in/out of bathtub    PT Home Exercise Plan  02/19/20 repeated lumbar extension in standing 02/25/20 bridges, trunk rotations, rows, marches    Consulted and Agree with Plan of Care  Patient       Patient will benefit from skilled therapeutic intervention in order to improve the following deficits and impairments:  Abnormal gait, Decreased activity tolerance, Decreased balance, Decreased endurance, Decreased mobility, Decreased range of motion, Decreased strength, Difficulty walking, Hypomobility, Improper body mechanics, Postural dysfunction, Obesity, Pain  Visit Diagnosis: Low back pain, unspecified back pain laterality, unspecified chronicity, unspecified whether sciatica present  Other abnormalities of gait and mobility  Muscle weakness (generalized)     Problem List Patient Active Problem List   Diagnosis Date Noted  . Essential hypertension, benign 10/17/2019  . Hot flashes due to menopause 10/17/2019  . Vitamin D deficiency disease 10/17/2019  . HLD (hyperlipidemia) 10/17/2019  . GERD (gastroesophageal reflux disease) 10/17/2019  .  Chronic posterior anal fissure 02/28/2018  . External hemorrhoids with pain 02/28/2018  . Rectal bleeding 11/02/2017    2:27 PM, 02/25/20 Mearl Latin PT, DPT Physical Therapist at [place], [place], [address] Phone: [phone]   Fax:  [phone]  Name: Michele Duncan MRN: [id number] Date of Birth: [birth year]

## 2020-02-25 NOTE — Patient Instructions (Signed)
Access Code: [email or web address] URL: [email or web address] Date: 02/25/2020 Prepared by: Mitzi Hansen Imajean Mcdermid  Exercises Supine Bridge - 1 x daily - 7 x weekly - 2 sets - 10 reps Clamshell - 1 x daily - 7 x weekly - 1 sets - 15 reps Supine March - 1 x daily - 7 x weekly - 2 sets - 10 reps Standing Shoulder Row with Anchored Resistance - 1 x daily - 7 x weekly - 3 sets - 10 reps

## 2020-02-26 ENCOUNTER — Other Ambulatory Visit (INDEPENDENT_AMBULATORY_CARE_PROVIDER_SITE_OTHER): Payer: Self-pay | Admitting: Internal Medicine

## 2020-02-27 ENCOUNTER — Other Ambulatory Visit: Payer: Self-pay

## 2020-02-27 ENCOUNTER — Encounter (HOSPITAL_COMMUNITY): Payer: Self-pay

## 2020-02-27 ENCOUNTER — Ambulatory Visit (HOSPITAL_COMMUNITY): Payer: Medicare Other

## 2020-02-27 DIAGNOSIS — M545 Low back pain, unspecified: Secondary | ICD-10-CM

## 2020-02-27 DIAGNOSIS — R2689 Other abnormalities of gait and mobility: Secondary | ICD-10-CM

## 2020-02-27 DIAGNOSIS — M6281 Muscle weakness (generalized): Secondary | ICD-10-CM | POA: Diagnosis not present

## 2020-02-27 NOTE — Therapy (Signed)
[place], [place], [address] Phone: [phone]   Fax:  [phone]  Physical Therapy Treatment  Patient Details  Name: Michele Duncan MRN: [id number] Date of Birth: 1951-05-05 Referring Provider (PT): Magnus Sinning MD   Encounter Date: 02/27/2020  PT End of Session - 02/27/20 1417    Visit Number  3    Number of Visits  12    Date for PT Re-Evaluation  04/01/20    Authorization Type  Primary: Medicare Secondary: [employer];  Do FOTO every 5 visits    Authorization Time Period  3/17-->04/01/20    Progress Note Due on Visit  10    PT Start Time  1404    PT Stop Time  1442    PT Time Calculation (min)  38 min    Activity Tolerance  Patient tolerated treatment well    Behavior During Therapy  [facility] for tasks assessed/performed       Past Medical History:  Diagnosis Date  . Anxiety   . Arthritis    low back  . Chronic anal fissure   . Chronic low back pain   . Essential hypertension, benign 10/17/2019  . GERD (gastroesophageal reflux disease) 10/17/2019  . Grade II internal hemorrhoids   . Hemorrhoids, external   . History of adenomatous polyp of colon    11-06-2017  . HLD (hyperlipidemia) 10/17/2019  . Hot flashes due to menopause 10/17/2019  . HTN (hypertension)   . Vitamin D deficiency disease 10/17/2019  . Wears glasses     Past Surgical History:  Procedure Laterality Date  . BREAST BIOPSY Left 1970s   benign   . COLONOSCOPY N/A 11/06/2017   Procedure: COLONOSCOPY;  Surgeon: Danie Binder, MD;  [facility];  Service: Endoscopy;  Laterality: N/A;  1:45pm  . HEMORRHOID SURGERY N/A 02/28/2018   Procedure: HEMORRHOIDECTOMY;  Surgeon: Michael Boston, MD;  Location: [facility];  Service: General;  Laterality: N/A;  . SPHINCTEROTOMY N/A 02/28/2018   Procedure: LATERAL SPHINCTEROTOMY;  Surgeon: Michael Boston, MD;  Location: [facility];  Service: General;   Laterality: N/A;    There were no vitals filed for this visit.  Subjective Assessment - 02/27/20 1408    Subjective  Pt stated she is feeling a lot better today.  Reports ability to bend over and pick up items off floor without pain.  Reports she has been slacking with her HEP.    Patient Stated Goals  To get back to doing her daily chores like normal    Currently in Pain?  No/denies                       [facility] Adult PT Treatment/Exercise - 02/27/20 0001      Posture/Postural Control   Posture/Postural Control  Postural limitations    Posture Comments  slouched, constant positional changes, frequent lean to L/R      Exercises   Exercises  Lumbar      Lumbar Exercises: Standing   Other Standing Lumbar Exercises  standing row 3x10 green band    Other Standing Lumbar Exercises  getting in/out of bathtub x 1 I; SLS Rt and Lt 30"       Lumbar Exercises: Supine   Ab Set  10 reps;3 seconds   2 sets   AB Set Limitations  verbal/tactile cueing paired with breathing, contract with exhale    Clam  15  reps    Clam Limitations  bilateral    Bent Knee Raise  10 reps    Bent Knee Raise Limitations  2 sets bilateral    Bridge  10 reps    Bridge Limitations  2 sets with prior glute contraction    Other Supine Lumbar Exercises  tactile and verbal cueing for proper abdominal sets, cueing for breathing             PT Education - 02/27/20 1432    Education Details  Instructed proper abdominal sets, importance of breathing with all exercises    Person(s) Educated  Patient    Methods  Explanation;Tactile cues;Verbal cues    Comprehension  Verbalized understanding;Returned demonstration;Verbal cues required       PT Short Term Goals - 02/19/20 1509      PT SHORT TERM GOAL #1   Title  Patient will be independent with HEP in order to improve functional outcomes.    Time  3    Period  Weeks    Status  New    Target Date  03/11/20      PT SHORT TERM GOAL #2   Title   Patient will report at least 25% improvement in symptoms for improved quality of life.    Time  3    Period  Weeks    Status  New    Target Date  03/11/20        PT Long Term Goals - 02/19/20 1509      PT LONG TERM GOAL #1   Title  Patient will report at least 75% improvement in symptoms for improved quality of life.    Time  3    Period  Weeks    Status  New    Target Date  04/01/20      PT LONG TERM GOAL #2   Title  Patient will be able to perform all ADL unrestricted for improved ability to take care of her family.    Time  6    Period  Weeks    Status  New    Target Date  04/01/20      PT LONG TERM GOAL #3   Title  Patient will be able to ambulate for at least 15 mins with symptoms no greater than 2/10 for improved tolerance to activity.    Time  6    Period  Weeks    Status  New    Target Date  04/01/20      PT LONG TERM GOAL #4   Title  Patient will be able to complete 5x STS in under 11.4 seconds in order to reduce the risk of falls.    Time  6    Period  Weeks    Status  New    Target Date  04/01/20            Plan - 02/27/20 1437    Clinical Impression Statement  Pt reports she is making vast improvements with no reports of pain and able to pick up item from floor with no discomfort.  This session assessed pt's ability to get in/out of bathtub, SLS as well trial with extension based exercises.  Pt able to get up and down from bathtub independently.  Recommended/encouraged pt to add handrails beside bathtub for safety/fall prevention, verbalized understanding.  Pt able to SLS 30" BLE first attempt.  Positive response following POE.  Continued core and proximal strengthening.  Pt with most difficulty with  abdmonal contraction and holding breath.  Pt educated proper abdmonial contraction paired with breathing.  Pt will benefit from further abdominal contraction next session.    Personal Factors and Comorbidities  Behavior Pattern;Comorbidity  2;Fitness;Past/Current Experience    Comorbidities  chronic low back pain, sedentary lifestyle    Examination-Activity Limitations  Bathing;Bed Mobility;Bend;Caring for Others;Carry;Dressing;Lift;Locomotion Level;Sit;Sleep;Squat;Stand;Transfers    Examination-Participation Restrictions  Cleaning;Community Activity;Driving;Interpersonal Relationship;Laundry;Meal Prep;Shop;Yard Work    Stability/Clinical Decision Making  Evolving/Moderate complexity    Clinical Decision Making  Moderate    Rehab Potential  Good    PT Frequency  2x / week    PT Duration  6 weeks    PT Treatment/Interventions  ADLs/Self Care Home Management;Aquatic Therapy;Biofeedback;Cryotherapy;Electrical Stimulation;Iontophoresis 4mg /ml Dexamethasone;Moist Heat;Traction;Ultrasound;DME Instruction;Gait training;Stair training;Functional mobility training;Therapeutic activities;Therapeutic exercise;Balance training;Neuromuscular re-education;Patient/family education;Orthotic Fit/Training;Manual techniques;Compression bandaging;Scar mobilization;Passive range of motion;Dry needling;Energy conservation;Splinting;Taping;Spinal Manipulations;Joint Manipulations    PT Next Visit Plan  Continue educated for proper abdominal sets, progress as able.  Continue core and hip strengthening.  Manual therapy PRN.    PT Home Exercise Plan  02/19/20 repeated lumbar extension in standing 02/25/20 bridges, trunk rotations, rows, marches       Patient will benefit from skilled therapeutic intervention in order to improve the following deficits and impairments:  Abnormal gait, Decreased activity tolerance, Decreased balance, Decreased endurance, Decreased mobility, Decreased range of motion, Decreased strength, Difficulty walking, Hypomobility, Improper body mechanics, Postural dysfunction, Obesity, Pain  Visit Diagnosis: Low back pain, unspecified back pain laterality, unspecified chronicity, unspecified whether sciatica present  Other abnormalities of  gait and mobility  Muscle weakness (generalized)     Problem List Patient Active Problem List   Diagnosis Date Noted  . Essential hypertension, benign 10/17/2019  . Hot flashes due to menopause 10/17/2019  . Vitamin D deficiency disease 10/17/2019  . HLD (hyperlipidemia) 10/17/2019  . GERD (gastroesophageal reflux disease) 10/17/2019  . Chronic posterior anal fissure 02/28/2018  . External hemorrhoids with pain 02/28/2018  . Rectal bleeding 11/02/2017   Ihor Austin, LPTA/CLT; CBIS [phone]  Aldona Lento 02/27/2020, 6:29 PM  [place] [address] [place], [place], [address] Phone: [phone]   Fax:  [phone]  Name: Michele Duncan MRN: [id number] Date of Birth: 1951-07-07

## 2020-03-02 ENCOUNTER — Other Ambulatory Visit (INDEPENDENT_AMBULATORY_CARE_PROVIDER_SITE_OTHER): Payer: Self-pay

## 2020-03-02 ENCOUNTER — Encounter (HOSPITAL_COMMUNITY): Payer: Self-pay | Admitting: Physical Therapy

## 2020-03-02 ENCOUNTER — Other Ambulatory Visit: Payer: Self-pay

## 2020-03-02 ENCOUNTER — Ambulatory Visit (HOSPITAL_COMMUNITY): Payer: Medicare Other | Admitting: Physical Therapy

## 2020-03-02 DIAGNOSIS — M545 Low back pain, unspecified: Secondary | ICD-10-CM

## 2020-03-02 DIAGNOSIS — M6281 Muscle weakness (generalized): Secondary | ICD-10-CM

## 2020-03-02 DIAGNOSIS — R2689 Other abnormalities of gait and mobility: Secondary | ICD-10-CM

## 2020-03-02 MED ORDER — POTASSIUM CHLORIDE CRYS ER 20 MEQ PO TBCR: 40.0000 meq | EXTENDED_RELEASE_TABLET | Freq: Every morning | ORAL | 1 refills | Status: DC

## 2020-03-02 NOTE — Therapy (Signed)
[place], [place], [address] Phone: [phone]   Fax:  [phone]  Physical Therapy Treatment  Patient Details  Name: Michele Duncan MRN: [id number] Date of Birth: 1951/09/07 Referring Provider (PT): Magnus Sinning MD   Encounter Date: 03/02/2020  PT End of Session - 03/02/20 1442    Visit Number  4    Number of Visits  12    Date for PT Re-Evaluation  04/01/20    Authorization Type  Primary: Medicare Secondary: [employer];  Do FOTO every 5 visits    Authorization Time Period  3/17-->04/01/20    Progress Note Due on Visit  10    PT Start Time  1435    PT Stop Time  1515    PT Time Calculation (min)  40 min    Activity Tolerance  Patient tolerated treatment well    Behavior During Therapy  [facility] for tasks assessed/performed       Past Medical History:  Diagnosis Date  . Anxiety   . Arthritis    low back  . Chronic anal fissure   . Chronic low back pain   . Essential hypertension, benign 10/17/2019  . GERD (gastroesophageal reflux disease) 10/17/2019  . Grade II internal hemorrhoids   . Hemorrhoids, external   . History of adenomatous polyp of colon    11-06-2017  . HLD (hyperlipidemia) 10/17/2019  . Hot flashes due to menopause 10/17/2019  . HTN (hypertension)   . Vitamin D deficiency disease 10/17/2019  . Wears glasses     Past Surgical History:  Procedure Laterality Date  . BREAST BIOPSY Left 1970s   benign   . COLONOSCOPY N/A 11/06/2017   Procedure: COLONOSCOPY;  Surgeon: Danie Binder, MD;  [facility];  Service: Endoscopy;  Laterality: N/A;  1:45pm  . HEMORRHOID SURGERY N/A 02/28/2018   Procedure: HEMORRHOIDECTOMY;  Surgeon: Michael Boston, MD;  Location: [facility];  Service: General;  Laterality: N/A;  . SPHINCTEROTOMY N/A 02/28/2018   Procedure: LATERAL SPHINCTEROTOMY;  Surgeon: Michael Boston, MD;  Location: [facility];  Service: General;   Laterality: N/A;    There were no vitals filed for this visit.  Subjective Assessment - 03/02/20 1435    Subjective  Patient reports taking care of her grand kid hurts her back to lift the child up. She is having some low back pain but is overall doing good. Her exercises have been going well and she does them every other day.    Patient Stated Goals  To get back to doing her daily chores like normal    Currently in Pain?  No/denies                       [facility] Adult PT Treatment/Exercise - 03/02/20 0001      Lumbar Exercises: Standing   Other Standing Lumbar Exercises  standing shoulder extension 2x10      Manual Therapy   Manual Therapy  Joint mobilization    Manual therapy comments  completed separately from all other aspects of treatment    Joint Mobilization  Grade II-III CPA L2-L5, R and L sacrum base PA, R and L PSIS, inferior sacrum             PT Education - 03/02/20 1440    Education Details  Patient educated on HEP, keeping track of aggrivating factors    Person(s) Educated  Patient  Methods  Explanation    Comprehension  Verbalized understanding       PT Short Term Goals - 02/19/20 1509      PT SHORT TERM GOAL #1   Title  Patient will be independent with HEP in order to improve functional outcomes.    Time  3    Period  Weeks    Status  New    Target Date  03/11/20      PT SHORT TERM GOAL #2   Title  Patient will report at least 25% improvement in symptoms for improved quality of life.    Time  3    Period  Weeks    Status  New    Target Date  03/11/20        PT Long Term Goals - 02/19/20 1509      PT LONG TERM GOAL #1   Title  Patient will report at least 75% improvement in symptoms for improved quality of life.    Time  3    Period  Weeks    Status  New    Target Date  04/01/20      PT LONG TERM GOAL #2   Title  Patient will be able to perform all ADL unrestricted for improved ability to take care of her family.    Time   6    Period  Weeks    Status  New    Target Date  04/01/20      PT LONG TERM GOAL #3   Title  Patient will be able to ambulate for at least 15 mins with symptoms no greater than 2/10 for improved tolerance to activity.    Time  6    Period  Weeks    Status  New    Target Date  04/01/20      PT LONG TERM GOAL #4   Title  Patient will be able to complete 5x STS in under 11.4 seconds in order to reduce the risk of falls.    Time  6    Period  Weeks    Status  New    Target Date  04/01/20            Plan - 03/02/20 1446    Clinical Impression Statement  Patient continues to have pain when lifting in awkward positions but not with most other ADL. She experiences no increase in symptoms with treatment today. She requires verbal cueing for core activation and slow, controlled movements. Patient feeling tired and becomes SOB during session with BP measured at 162/94. After 2 minutes, 153/90. She states she has been slightly SOB most of the day and felt like her blood pressure has been high all day. Patient wishes to complete manual therapy for remainder of session. She has normal segmental mobility and states mobilizations feel good. She states she has a history of anal fissure and surgery in 2019 which may be contributing to symptoms. Patient has most symptoms with different sitting positions/pressure in coccyx region and with lifting but most low back symptoms have resolved at this time. Patient is educated on being aware of aggravating factors to see if symptoms are of MSK origin. Patient will continue to benefit from skilled physical therapy in order to improve function and reduce impairment.    Personal Factors and Comorbidities  Behavior Pattern;Comorbidity 2;Fitness;Past/Current Experience    Comorbidities  chronic low back pain, sedentary lifestyle    Examination-Activity Limitations  Bathing;Bed Mobility;Bend;Caring for Others;Carry;Dressing;Lift;Locomotion  Level;Sit;Sleep;Squat;Stand;Transfers    Examination-Participation Restrictions  Cleaning;Community Activity;Driving;Interpersonal Relationship;Laundry;Meal Prep;Shop;Yard Work    [employer]  Evolving/Moderate complexity    Rehab Potential  Good    PT Frequency  2x / week    PT Duration  6 weeks    PT Treatment/Interventions  ADLs/Self Care Home Management;Aquatic Therapy;Biofeedback;Cryotherapy;Electrical Stimulation;Iontophoresis 4mg /ml Dexamethasone;Moist Heat;Traction;Ultrasound;DME Instruction;Gait training;Stair training;Functional mobility training;Therapeutic activities;Therapeutic exercise;Balance training;Neuromuscular re-education;Patient/family education;Orthotic Fit/Training;Manual techniques;Compression bandaging;Scar mobilization;Passive range of motion;Dry needling;Energy conservation;Splinting;Taping;Spinal Manipulations;Joint Manipulations    PT Next Visit Plan  Continue educated for proper abdominal sets, progress as able.  Continue core and hip strengthening.  Manual therapy PRN.    PT Home Exercise Plan  02/19/20 repeated lumbar extension in standing 02/25/20 bridges, trunk rotations, rows, marches       Patient will benefit from skilled therapeutic intervention in order to improve the following deficits and impairments:  Abnormal gait, Decreased activity tolerance, Decreased balance, Decreased endurance, Decreased mobility, Decreased range of motion, Decreased strength, Difficulty walking, Hypomobility, Improper body mechanics, Postural dysfunction, Obesity, Pain  Visit Diagnosis: Low back pain, unspecified back pain laterality, unspecified chronicity, unspecified whether sciatica present  Other abnormalities of gait and mobility  Muscle weakness (generalized)     Problem List Patient Active Problem List   Diagnosis Date Noted  . Essential hypertension, benign 10/17/2019  . Hot flashes due to menopause 10/17/2019  . Vitamin D deficiency disease  10/17/2019  . HLD (hyperlipidemia) 10/17/2019  . GERD (gastroesophageal reflux disease) 10/17/2019  . Chronic posterior anal fissure 02/28/2018  . External hemorrhoids with pain 02/28/2018  . Rectal bleeding 11/02/2017    4:27 PM, 03/02/20 Mearl Latin PT, DPT Physical Therapist at [place], [place], [address] Phone: [phone]   Fax:  [phone]  Name: KEARRAH ANKNEY MRN: [id number] Date of Birth: 1951-04-07

## 2020-03-04 ENCOUNTER — Ambulatory Visit (HOSPITAL_COMMUNITY): Payer: Medicare Other | Admitting: Physical Therapy

## 2020-03-04 ENCOUNTER — Telehealth (HOSPITAL_COMMUNITY): Payer: Self-pay | Admitting: Physical Therapy

## 2020-03-04 NOTE — Telephone Encounter (Signed)
Pt called stating she thinks her B/P is up and she is not feeling herself, please cx today and she will return on Tuesday next wk.

## 2020-03-09 ENCOUNTER — Other Ambulatory Visit (INDEPENDENT_AMBULATORY_CARE_PROVIDER_SITE_OTHER): Payer: Self-pay

## 2020-03-09 MED ORDER — POTASSIUM CHLORIDE CRYS ER 20 MEQ PO TBCR: 40.0000 meq | EXTENDED_RELEASE_TABLET | Freq: Every morning | ORAL | 1 refills | Status: DC

## 2020-03-10 ENCOUNTER — Ambulatory Visit (HOSPITAL_COMMUNITY): Payer: Medicare Other

## 2020-03-10 ENCOUNTER — Telehealth (HOSPITAL_COMMUNITY): Payer: Self-pay

## 2020-03-10 NOTE — Telephone Encounter (Signed)
pt cancelled her remaining appts because she said she feels better and she needs to be home with her husband who has cancer

## 2020-03-11 ENCOUNTER — Encounter (HOSPITAL_COMMUNITY): Payer: Self-pay | Admitting: Physical Therapy

## 2020-03-11 NOTE — Therapy (Signed)
[place], [place], [address] Phone: [phone]   Fax:  [phone]  Patient Details  Name: Michele Duncan MRN: [id number] Date of Birth: Dec 26, 1950 Referring Provider:  No ref. provider found  Encounter Date: 03/11/2020   PHYSICAL THERAPY DISCHARGE SUMMARY  Visits from Start of Care: 4  Current functional level related to goals / functional outcomes: Patient has improved function and decreased symptoms since beginning therapy. She has been able to complete HEP independently, reported significant improvement in symptoms, able to perform ADL without restriction, and increased ability to ambulate with minimal to no pain. She reported she is feeling great and wishes to be discharged from therapy at this time.    Remaining deficits: She continues to have pain/discomfort with sitting in different positions which may not be related to low back back. She states she has a history of anal fissure and surgery in 2019 which may be contributing to symptoms. Patient has most symptoms with different sitting positions/pressure in coccyx region and with lifting but most low back symptoms have resolved at this time.    Education / Equipment: Patient educated on low back pain, HEP, posture, lumbar roll when seated. Plan: Patient agrees to discharge.  Patient goals were partially met. Patient is being discharged due to being pleased with the current functional level.  ?????       8:31 AM, 03/11/20 Mearl Latin PT, DPT Physical Therapist at [place], [place], [address] Phone: [phone]   Fax:  [phone]

## 2020-03-12 ENCOUNTER — Ambulatory Visit (HOSPITAL_COMMUNITY): Payer: Medicare Other

## 2020-03-16 ENCOUNTER — Other Ambulatory Visit (INDEPENDENT_AMBULATORY_CARE_PROVIDER_SITE_OTHER): Payer: Self-pay | Admitting: Internal Medicine

## 2020-03-16 ENCOUNTER — Ambulatory Visit (HOSPITAL_COMMUNITY): Payer: Medicare Other | Admitting: Physical Therapy

## 2020-03-16 MED ORDER — POTASSIUM CHLORIDE CRYS ER 20 MEQ PO TBCR: 40.0000 meq | EXTENDED_RELEASE_TABLET | Freq: Every morning | ORAL | 1 refills | Status: DC

## 2020-03-17 ENCOUNTER — Other Ambulatory Visit (INDEPENDENT_AMBULATORY_CARE_PROVIDER_SITE_OTHER): Payer: Self-pay

## 2020-03-17 MED ORDER — POTASSIUM CHLORIDE CRYS ER 20 MEQ PO TBCR: 40.0000 meq | EXTENDED_RELEASE_TABLET | Freq: Every morning | ORAL | 1 refills | Status: DC

## 2020-03-18 ENCOUNTER — Ambulatory Visit (HOSPITAL_COMMUNITY): Payer: Medicare Other | Admitting: Physical Therapy

## 2020-03-23 ENCOUNTER — Encounter (HOSPITAL_COMMUNITY): Payer: Medicare Other | Admitting: Physical Therapy

## 2020-03-25 ENCOUNTER — Encounter (HOSPITAL_COMMUNITY): Payer: Medicare Other | Admitting: Physical Therapy

## 2020-03-30 ENCOUNTER — Encounter (HOSPITAL_COMMUNITY): Payer: Medicare Other | Admitting: Physical Therapy

## 2020-04-02 ENCOUNTER — Ambulatory Visit (HOSPITAL_COMMUNITY): Payer: Medicare Other | Admitting: Physical Therapy

## 2020-04-07 ENCOUNTER — Encounter (HOSPITAL_COMMUNITY): Payer: Medicare Other

## 2020-04-09 ENCOUNTER — Encounter (HOSPITAL_COMMUNITY): Payer: Medicare Other | Admitting: Physical Therapy

## 2020-04-14 ENCOUNTER — Encounter (HOSPITAL_COMMUNITY): Payer: Medicare Other

## 2020-04-16 ENCOUNTER — Encounter (HOSPITAL_COMMUNITY): Payer: Medicare Other | Admitting: Physical Therapy

## 2020-04-20 ENCOUNTER — Other Ambulatory Visit: Payer: Self-pay

## 2020-04-20 ENCOUNTER — Ambulatory Visit (INDEPENDENT_AMBULATORY_CARE_PROVIDER_SITE_OTHER): Payer: Medicare Other | Admitting: Internal Medicine

## 2020-04-20 ENCOUNTER — Encounter (INDEPENDENT_AMBULATORY_CARE_PROVIDER_SITE_OTHER): Payer: Self-pay | Admitting: Internal Medicine

## 2020-04-20 VITALS — BP 130/90 | HR 96 | Temp 98.3°F | Ht 65.0 in | Wt 173.2 lb

## 2020-04-20 DIAGNOSIS — N951 Menopausal and female climacteric states: Secondary | ICD-10-CM | POA: Diagnosis not present

## 2020-04-20 DIAGNOSIS — E559 Vitamin D deficiency, unspecified: Secondary | ICD-10-CM

## 2020-04-20 DIAGNOSIS — R5383 Other fatigue: Secondary | ICD-10-CM

## 2020-04-20 DIAGNOSIS — R5381 Other malaise: Secondary | ICD-10-CM

## 2020-04-20 DIAGNOSIS — I10 Essential (primary) hypertension: Secondary | ICD-10-CM | POA: Diagnosis not present

## 2020-04-20 LAB — PROGESTERONE: Progesterone: 27.4 ng/mL

## 2020-04-20 LAB — T3, FREE: T3, Free: 4.9 pg/mL — ABNORMAL HIGH (ref 2.3–4.2)

## 2020-04-20 LAB — ESTRADIOL: Estradiol: 66 pg/mL

## 2020-04-20 NOTE — Progress Notes (Signed)
Metrics: Intervention Frequency ACO  Documented Smoking Status Yearly  Screened one or more times in 24 months  Cessation Counseling or  Active cessation medication Past 24 months  Past 24 months   Guideline developer: UpToDate (See UpToDate for funding source) Date Released: 2014       Wellness Office Visit  Subjective:  Patient ID: Michele Duncan, female    DOB: 11-Sep-1951  Age: 69 y.o. MRN: [id number]  CC: This lady comes in for follow-up of her multiple medical problems including hypertension, menopausal symptoms, vitamin D deficiency. HPI  Her main concern today is hot flashes.  Despite increasing the dose of estradiol which she has tolerated reasonably well, she continues to get hot flashes.  I have not actually seen her for the last 6 months.  She has been keeping away from outside with the pandemic.  She has now been fully vaccinated thankfully.  She is taking estradiol 1.5 mg daily. Past Medical History:  Diagnosis Date  . Anxiety   . Arthritis    low back  . Chronic anal fissure   . Chronic low back pain   . Essential hypertension, benign 10/17/2019  . GERD (gastroesophageal reflux disease) 10/17/2019  . Grade II internal hemorrhoids   . Hemorrhoids, external   . History of adenomatous polyp of colon    11-06-2017  . HLD (hyperlipidemia) 10/17/2019  . Hot flashes due to menopause 10/17/2019  . HTN (hypertension)   . Vitamin D deficiency disease 10/17/2019  . Wears glasses       Family History  Problem Relation Age of Onset  . Colon cancer Paternal Grandmother   . Cancer Mother   . Hypertension Father   . Heart disease Father   . Autoimmune disease Sister   . Lung disease Sister   . 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   Social History   Tobacco Use  . Smoking status: Former Smoker    Types: Cigarettes    Quit date: 12/05/2013    Years since quitting:  6.3  . Smokeless tobacco: Never Used  Substance Use Topics  . Alcohol use: No    Current Meds  Medication Sig  . acetaminophen (TYLENOL) 650 MG CR tablet Take 650 mg by mouth every 8 (eight) hours as needed for pain.  Marland Kitchen aluminum hydroxide-magnesium carbonate (GAVISCON) 95-358 MG/15ML SUSP Take 15 mLs by mouth as needed for indigestion.  . Cholecalciferol (VITAMIN D-3 PO) Take 7,000 Int'l Units by mouth daily.  . diclofenac (VOLTAREN) 75 MG EC tablet TAKE 1 TABLET BY MOUTH TWICE DAILY WITH MEALS.  Marland Kitchen estradiol (ESTRACE) 1 MG tablet Take 1 tablet (1 mg total) by mouth daily. (Patient taking differently: Take 1.5 mg by mouth daily. )  . Estradiol Micronized (EC-RX ESTRADIOL TD) Place 0.5 mLs onto the skin 2 (two) times a week. Compounded cream from [place]  . hydrochlorothiazide (MICROZIDE) 12.5 MG capsule TAKE (1) CAPSULE BY MOUTH ONCE DAILY.  Marland Kitchen losartan (COZAAR) 50 MG tablet Take 1 tablet (50 mg total) by mouth daily.  . naproxen (NAPROSYN) 500 MG tablet Take 1 tablet (500 mg total) by mouth 2 (two) times daily with a meal.  . pantoprazole (PROTONIX) 40 MG tablet TAKE ONE TABLET BY MOUTH TWICE A DAY  . potassium chloride SA (KLOR-CON) 20 MEQ tablet Take 2 tablets (40 mEq total) by mouth every morning.  . progesterone ([place])  200 MG capsule TAKE 2 CAPSULES BY MOUTH ONCE DAILY AT NIGHT  . thyroid (NP THYROID) 90 MG tablet Take 1 tablet (90 mg total) by mouth daily.      Objective:   Today's Vitals: BP 130/90 (BP Location: Left Arm, Patient Position: Sitting, Cuff Size: Normal)   Pulse 96   Temp 98.3 F (36.8 C) (Temporal)   Ht 5\' 5"  (1.651 m)   Wt 173 lb 3.2 oz (78.6 kg)   SpO2 97%   BMI 28.82 kg/m  Vitals with BMI 04/20/2020 01/21/2020 01/20/2020  Height 5\' 5"  5\' 5"  -  Weight 173 lbs 3 oz 169 lbs 3 oz -  BMI [id number] -  Systolic [id number]  Diastolic 90 90 85  Pulse 96 98 112     Physical Exam   She looks systemically well.  She has gained about 4 pounds  since last time she was seen in the [facility].    Assessment   1. Hot flashes due to menopause   2. Essential hypertension, benign   3. Vitamin D deficiency disease   4. Malaise and fatigue       Tests ordered Orders Placed This Encounter  Procedures  . Estradiol  . Progesterone  . T3, free     Plan: 1. Blood work is ordered. 2. She will continue with bioidentical hormone therapy as before and we may need to adjust doses. 3. I wonder also if her thyroid is a little bit too much for her and we will see what her T3 levels are. 4. I will see her back in about a month's time for close follow-up.  I discussed and spent 30 minutes with her today regarding all of the above.   No orders of the defined types were placed in this encounter.   [name], MD

## 2020-04-21 ENCOUNTER — Encounter (HOSPITAL_COMMUNITY): Payer: Medicare Other

## 2020-04-21 ENCOUNTER — Other Ambulatory Visit (INDEPENDENT_AMBULATORY_CARE_PROVIDER_SITE_OTHER): Payer: Self-pay | Admitting: Internal Medicine

## 2020-04-21 MED ORDER — ESTRADIOL 2 MG PO TABS: 2.0000 mg | ORAL_TABLET | Freq: Every day | ORAL | 3 refills | Status: DC

## 2020-04-23 ENCOUNTER — Encounter (HOSPITAL_COMMUNITY): Payer: Medicare Other | Admitting: Physical Therapy

## 2020-04-27 ENCOUNTER — Other Ambulatory Visit (INDEPENDENT_AMBULATORY_CARE_PROVIDER_SITE_OTHER): Payer: Self-pay

## 2020-04-27 MED ORDER — PROGESTERONE 200 MG PO CAPS: 200.0000 mg | ORAL_CAPSULE | Freq: Every day | ORAL | 0 refills | Status: DC

## 2020-04-28 ENCOUNTER — Telehealth (INDEPENDENT_AMBULATORY_CARE_PROVIDER_SITE_OTHER): Payer: Self-pay | Admitting: Nurse Practitioner

## 2020-04-28 ENCOUNTER — Encounter (HOSPITAL_COMMUNITY): Payer: Medicare Other

## 2020-04-28 ENCOUNTER — Other Ambulatory Visit (INDEPENDENT_AMBULATORY_CARE_PROVIDER_SITE_OTHER): Payer: Self-pay | Admitting: Nurse Practitioner

## 2020-04-28 ENCOUNTER — Other Ambulatory Visit (INDEPENDENT_AMBULATORY_CARE_PROVIDER_SITE_OTHER): Payer: Self-pay | Admitting: Internal Medicine

## 2020-04-28 MED ORDER — PROGESTERONE 200 MG PO CAPS: 400.0000 mg | ORAL_CAPSULE | Freq: Every day | ORAL | 3 refills | Status: DC

## 2020-04-28 NOTE — Telephone Encounter (Signed)
This patient is calling with a question regarding her progesterone prescription.  I guess she had it refilled yesterday to take 1 tablet by mouth daily, previously she was taking 2 tablets by mouth every evening.  Will you let Serena Colonel know which dose you want the patient to take and send the prescription for 2 tablets by mouth every evening if that is the one that she want her on?  Thank you.

## 2020-04-28 NOTE — Telephone Encounter (Signed)
Please call this patient to let her know that it was my mistake and I have now sent a new prescription of progesterone 200 mg capsules, she needs to take 2 capsules every evening.  Sorry for the confusion.

## 2020-04-28 NOTE — Telephone Encounter (Signed)
Michele Duncan is aware

## 2020-04-30 ENCOUNTER — Encounter (HOSPITAL_COMMUNITY): Payer: Medicare Other | Admitting: Physical Therapy

## 2020-06-01 ENCOUNTER — Other Ambulatory Visit (INDEPENDENT_AMBULATORY_CARE_PROVIDER_SITE_OTHER): Payer: Self-pay | Admitting: Internal Medicine

## 2020-06-09 ENCOUNTER — Encounter (INDEPENDENT_AMBULATORY_CARE_PROVIDER_SITE_OTHER): Payer: Self-pay | Admitting: Internal Medicine

## 2020-06-09 ENCOUNTER — Ambulatory Visit (INDEPENDENT_AMBULATORY_CARE_PROVIDER_SITE_OTHER): Payer: Medicare Other | Admitting: Internal Medicine

## 2020-06-09 ENCOUNTER — Other Ambulatory Visit: Payer: Self-pay

## 2020-06-09 VITALS — BP 120/80 | HR 88 | Temp 97.5°F | Resp 18 | Ht 63.0 in | Wt 174.0 lb

## 2020-06-09 DIAGNOSIS — N951 Menopausal and female climacteric states: Secondary | ICD-10-CM | POA: Diagnosis not present

## 2020-06-09 DIAGNOSIS — R5381 Other malaise: Secondary | ICD-10-CM | POA: Diagnosis not present

## 2020-06-09 DIAGNOSIS — E782 Mixed hyperlipidemia: Secondary | ICD-10-CM

## 2020-06-09 DIAGNOSIS — I10 Essential (primary) hypertension: Secondary | ICD-10-CM | POA: Diagnosis not present

## 2020-06-09 DIAGNOSIS — R5383 Other fatigue: Secondary | ICD-10-CM

## 2020-06-09 LAB — TSH: TSH: 1.32 mIU/L (ref 0.40–4.50)

## 2020-06-09 LAB — ESTRADIOL: Estradiol: 99 pg/mL

## 2020-06-09 LAB — T4: T4, Total: 14.9 ug/dL — ABNORMAL HIGH (ref 5.1–11.9)

## 2020-06-09 LAB — T3, FREE: T3, Free: 2.6 pg/mL (ref 2.3–4.2)

## 2020-06-09 LAB — PROGESTERONE: Progesterone: 22.6 ng/mL

## 2020-06-09 NOTE — Progress Notes (Signed)
Metrics: Intervention Frequency ACO  Documented Smoking Status Yearly  Screened one or more times in 24 months  Cessation Counseling or  Active cessation medication Past 24 months  Past 24 months   Guideline developer: UpToDate (See UpToDate for funding source) Date Released: 2014       Wellness Office Visit  Subjective:  Patient ID: Michele Duncan, female    DOB: 1951-07-21  Age: 69 y.o. MRN: [id number]  CC: This lady comes in for follow-up of menopausal symptoms, hypertension, fatigue. HPI  She has discontinued NP thyroid because of cost. She did increase the estradiol and has tolerated the dose.  She continues to take progesterone doses before. She continues to have burning in the vaginal area which has been present for 6 months.  We tried estradiol cream because I thought she had vagina dryness but this is not helped her in the past.  She has not seen her gynecologist in more than 1 year. Past Medical History:  Diagnosis Date  . Anxiety   . Arthritis    low back  . Chronic anal fissure   . Chronic low back pain   . Essential hypertension, benign 10/17/2019  . GERD (gastroesophageal reflux disease) 10/17/2019  . Grade II internal hemorrhoids   . Hemorrhoids, external   . History of adenomatous polyp of colon    11-06-2017  . HLD (hyperlipidemia) 10/17/2019  . Hot flashes due to menopause 10/17/2019  . HTN (hypertension)   . Vitamin D deficiency disease 10/17/2019  . Wears glasses    Past Surgical History:  Procedure Laterality Date  . BREAST BIOPSY Left 1970s   benign   . COLONOSCOPY N/A 11/06/2017   Procedure: COLONOSCOPY;  Surgeon: Danie Binder, MD;  [facility];  Service: Endoscopy;  Laterality: N/A;  1:45pm  . HEMORRHOID SURGERY N/A 02/28/2018   Procedure: HEMORRHOIDECTOMY;  Surgeon: Michael Boston, MD;  Location: [facility];  Service: General;  Laterality: N/A;  . SPHINCTEROTOMY N/A 02/28/2018   Procedure: LATERAL SPHINCTEROTOMY;   Surgeon: Michael Boston, MD;  Location: [facility];  Service: General;  Laterality: N/A;     Family History  Problem Relation Age of Onset  . Colon cancer Paternal Grandmother   . Cancer Mother   . Hypertension Father   . Heart disease Father   . Autoimmune disease Sister   . Lung disease Sister   . 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   Social History   Tobacco Use  . Smoking status: Former Smoker    Types: Cigarettes    Quit date: 12/05/2013    Years since quitting: 6.5  . Smokeless tobacco: Never Used  Substance Use Topics  . Alcohol use: No    Current Meds  Medication Sig  . acetaminophen (TYLENOL) 650 MG CR tablet Take 650 mg by mouth every 8 (eight) hours as needed for pain.  Marland Kitchen aluminum hydroxide-magnesium carbonate (GAVISCON) 95-358 MG/15ML SUSP Take 15 mLs by mouth as needed for indigestion.  . Cholecalciferol (VITAMIN D-3 PO) Take 7,000 Int'l Units by mouth daily.  . diclofenac (VOLTAREN) 75 MG EC tablet TAKE 1 TABLET BY MOUTH TWICE DAILY WITH MEALS.  Marland Kitchen estradiol (ESTRACE) 2 MG tablet Take 1 tablet (2 mg total) by mouth daily.  . Estradiol Micronized (EC-RX ESTRADIOL TD) Place 0.5 mLs onto the skin 2 (two) times a week. Compounded cream  from [place]  . hydrochlorothiazide (MICROZIDE) 12.5 MG capsule TAKE 1 CAPSULE ONCE DAILY  . losartan (COZAAR) 50 MG tablet TAKE 1 TABLET DAILY  . naproxen (NAPROSYN) 500 MG tablet Take 1 tablet (500 mg total) by mouth 2 (two) times daily with a meal.  . pantoprazole (PROTONIX) 40 MG tablet TAKE ONE TABLET BY MOUTH TWICE A DAY  . potassium chloride SA (KLOR-CON) 20 MEQ tablet Take 2 tablets (40 mEq total) by mouth every morning.  . progesterone (PROMETRIUM) 200 MG capsule Take 2 capsules (400 mg total) by mouth daily.  . [DISCONTINUED] progesterone (PROMETRIUM) 200 MG capsule TAKE 2 CAPSULES BY MOUTH ONCE  DAILY AT NIGHT  . [DISCONTINUED] thyroid (NP THYROID) 90 MG tablet Take 1 tablet (90 mg total) by mouth daily.      Depression screen [facility] 2/9 01/21/2020  Decreased Interest 0  Down, Depressed, Hopeless 0  PHQ - 2 Score 0     Objective:   Today's Vitals: BP 120/80 (BP Location: Right Arm, Patient Position: Sitting, Cuff Size: Normal)   Pulse 88   Temp (!) 97.5 F (36.4 C) (Temporal)   Resp 18   Ht 5\' 3"  (1.6 m)   Wt 174 lb (78.9 kg)   SpO2 98% Comment: wear mask  BMI 30.82 kg/m  Vitals with BMI 06/09/2020 04/20/2020 01/21/2020  Height 5\' 3"  5\' 5"  5\' 5"   Weight 174 lbs 173 lbs 3 oz 169 lbs 3 oz  BMI 30.83 59.97 74.14  Systolic 239 532 023  Diastolic 80 90 90  Pulse 88 96 98     Physical Exam  She looks systemically well.  Weight is stable.  Blood pressure is excellent.     Assessment   1. Hot flashes due to menopause   2. Essential hypertension, benign   3. Malaise and fatigue   4. Mixed hyperlipidemia       Tests ordered Orders Placed This Encounter  Procedures  . Estradiol  . Progesterone  . T3, free  . TSH  . T4     Plan: 1. She will continue with estradiol and progesterone for menopausal symptoms and we will check levels today.  I have told her if the levels are adequate, I would recommend that she goes to see her gynecologist again to further evaluate the source of vagina burning sensation. 2. She will continue with losartan as before for hypertension which seems to be controlling the blood pressure well. 3. Further recommendations will depend on blood results and I will see her in 3 months time for follow-up.   No orders of the defined types were placed in this encounter.   [name], MD

## 2020-07-12 ENCOUNTER — Other Ambulatory Visit (INDEPENDENT_AMBULATORY_CARE_PROVIDER_SITE_OTHER): Payer: Self-pay | Admitting: Internal Medicine

## 2020-07-15 ENCOUNTER — Other Ambulatory Visit (INDEPENDENT_AMBULATORY_CARE_PROVIDER_SITE_OTHER): Payer: Self-pay | Admitting: Internal Medicine

## 2020-07-15 ENCOUNTER — Telehealth (INDEPENDENT_AMBULATORY_CARE_PROVIDER_SITE_OTHER): Payer: Self-pay | Admitting: Internal Medicine

## 2020-07-15 MED ORDER — NP THYROID 90 MG PO TABS: 90.0000 mg | ORAL_TABLET | Freq: Every day | ORAL | 0 refills | Status: DC

## 2020-07-15 NOTE — Telephone Encounter (Signed)
Okay I have sent it now.  Thanks.

## 2020-09-01 ENCOUNTER — Other Ambulatory Visit (INDEPENDENT_AMBULATORY_CARE_PROVIDER_SITE_OTHER): Payer: Self-pay | Admitting: Internal Medicine

## 2020-09-09 ENCOUNTER — Encounter (INDEPENDENT_AMBULATORY_CARE_PROVIDER_SITE_OTHER): Payer: Self-pay | Admitting: Internal Medicine

## 2020-09-09 ENCOUNTER — Ambulatory Visit (INDEPENDENT_AMBULATORY_CARE_PROVIDER_SITE_OTHER): Payer: Medicare Other | Admitting: Internal Medicine

## 2020-09-09 ENCOUNTER — Other Ambulatory Visit: Payer: Self-pay

## 2020-09-09 VITALS — BP 136/80 | HR 77 | Temp 97.5°F | Resp 18 | Ht 63.0 in | Wt 174.0 lb

## 2020-09-09 DIAGNOSIS — N951 Menopausal and female climacteric states: Secondary | ICD-10-CM | POA: Diagnosis not present

## 2020-09-09 DIAGNOSIS — R5381 Other malaise: Secondary | ICD-10-CM

## 2020-09-09 DIAGNOSIS — R5383 Other fatigue: Secondary | ICD-10-CM

## 2020-09-09 DIAGNOSIS — I10 Essential (primary) hypertension: Secondary | ICD-10-CM

## 2020-09-09 DIAGNOSIS — R002 Palpitations: Secondary | ICD-10-CM

## 2020-09-09 MED ORDER — NP THYROID 120 MG PO TABS: 120.0000 mg | ORAL_TABLET | Freq: Every day | ORAL | 3 refills | Status: DC

## 2020-09-09 NOTE — Progress Notes (Signed)
Metrics: Intervention Frequency ACO  Documented Smoking Status Yearly  Screened one or more times in 24 months  Cessation Counseling or  Active cessation medication Past 24 months  Past 24 months   Guideline developer: UpToDate (See UpToDate for funding source) Date Released: 2014       Wellness Office Visit  Subjective:  Patient ID: Michele Duncan, female    DOB: 04-01-1951  Age: 69 y.o. MRN: [id number]  CC: This lady comes in for follow-up of hypertension, bioidentical hormone therapy due to her menopausal symptoms, malaise and fatigue with thyroid hypofunction and vitamin D deficiency. HPI  She started taking NP thyroid the last time I saw her in July and although her energy levels are somewhat improved, she still feels very tired. She also has vagina itching and she did go and see her GYN who did several tests but told her that her main problem was vagina dryness.  She has prescribed a brand-name estradiol cream.  Unfortunately, this is very expensive at $75 per month. Today she has been complaining of palpitations which typically wake her up at 4-6 AM in the morning.  The palpitations are rapid and regular.  They are not associated with any chest pain.  They last for probably half an hour.  The palpitations have been going on for the last 4 to 6 weeks.  The palpitations do not occur after she takes her thyroid medications in the morning. Past Medical History:  Diagnosis Date  . Anxiety   . Arthritis    low back  . Chronic anal fissure   . Chronic low back pain   . Essential hypertension, benign 10/17/2019  . GERD (gastroesophageal reflux disease) 10/17/2019  . Grade II internal hemorrhoids   . Hemorrhoids, external   . History of adenomatous polyp of colon    11-06-2017  . HLD (hyperlipidemia) 10/17/2019  . Hot flashes due to menopause 10/17/2019  . HTN (hypertension)   . Vitamin D deficiency disease 10/17/2019  . Wears glasses    Past Surgical History:  Procedure  Laterality Date  . BREAST BIOPSY Left 1970s   benign   . COLONOSCOPY N/A 11/06/2017   Procedure: COLONOSCOPY;  Surgeon: Danie Binder, MD;  [facility];  Service: Endoscopy;  Laterality: N/A;  1:45pm  . HEMORRHOID SURGERY N/A 02/28/2018   Procedure: HEMORRHOIDECTOMY;  Surgeon: Michael Boston, MD;  Location: [facility];  Service: General;  Laterality: N/A;  . SPHINCTEROTOMY N/A 02/28/2018   Procedure: LATERAL SPHINCTEROTOMY;  Surgeon: Michael Boston, MD;  Location: [place];  Service: General;  Laterality: N/A;     Family History  Problem Relation Age of Onset  . Colon cancer Paternal Grandmother   . Cancer Mother   . Hypertension Father   . Heart disease Father   . Autoimmune disease Sister   . Lung disease Sister   . 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   Social History   Tobacco Use  . Smoking status: Former Smoker    Types: Cigarettes    Quit date: 12/05/2013    Years since quitting: 6.7  . Smokeless tobacco: Never Used  Substance Use Topics  . Alcohol use: No    Current Meds  Medication Sig  . acetaminophen (TYLENOL) 650 MG CR tablet Take 650 mg by mouth every 8 (eight) hours as needed for pain.  Marland Kitchen  aluminum hydroxide-magnesium carbonate (GAVISCON) 95-358 MG/15ML SUSP Take 15 mLs by mouth as needed for indigestion.  . Cholecalciferol (VITAMIN D-3 PO) Take 7,000 Int'l Units by mouth daily.  . diclofenac (VOLTAREN) 75 MG EC tablet TAKE 1 TABLET BY MOUTH TWICE DAILY WITH MEALS.  Marland Kitchen estradiol (ESTRACE) 2 MG tablet TAKE ONE TABLET BY MOUTH ONCE DAILY.  Marland Kitchen Estradiol Micronized (EC-RX ESTRADIOL TD) Place 0.5 mLs onto the skin 2 (two) times a week. Compounded cream from [place]  . hydrochlorothiazide (MICROZIDE) 12.5 MG capsule TAKE 1 CAPSULE ONCE DAILY  . losartan (COZAAR) 50 MG tablet TAKE 1 TABLET DAILY  . naproxen  (NAPROSYN) 500 MG tablet TAKE 1 TABLET TWICE DAILY  WITH MEALS  . NP THYROID 90 MG tablet Take 1 tablet (90 mg total) by mouth daily.  . pantoprazole (PROTONIX) 40 MG tablet TAKE ONE TABLET BY MOUTH TWICE A DAY  . potassium chloride SA (KLOR-CON) 20 MEQ tablet Take 2 tablets (40 mEq total) by mouth every morning.  . progesterone (PROMETRIUM) 200 MG capsule TAKE 2 CAPSULES BY MOUTH ONCE DAILY.      Depression screen [facility] 2/9 01/21/2020  Decreased Interest 0  Down, Depressed, Hopeless 0  PHQ - 2 Score 0     Objective:   Today's Vitals: BP 136/80 (BP Location: Left Arm, Patient Position: Sitting, Cuff Size: Normal)   Pulse 77   Temp (!) 97.5 F (36.4 C) (Temporal)   Resp 18   Ht 5\' 3"  (1.6 m)   Wt 174 lb (78.9 kg)   SpO2 98%   BMI 30.82 kg/m  Vitals with BMI 09/09/2020 06/09/2020 04/20/2020  Height 5\' 3"  5\' 3"  5\' 5"   Weight 174 lbs 174 lbs 173 lbs 3 oz  BMI 30.83 67.12 45.80  Systolic 998 338 250  Diastolic 80 80 90  Pulse 77 88 96     Physical Exam  She looks systemically well.  Weight is stable.  Blood pressure is well maintained.     Assessment   1. Hot flashes due to menopause   2. Essential hypertension, benign   3. Malaise and fatigue   4. Palpitations       Tests ordered Orders Placed This Encounter  Procedures  . Ambulatory referral to Cardiology     Plan: 1. I am going to call [employer] and get her a cheaper compounded prescription for estradiol/testosterone vaginal cream.  The estradiol/testosterone vaginal cream compounded is going to consist of estradiol 0.1 mg/testosterone 5 mg applied to the vagina daily and it will be in the form of a [place]..  According to [employer], this will cost her $50 which will last for 2 months. 2. I have told her to increase NP thyroid and she will take NP thyroid 90 mg daily alternating with NP thyroid 120 mg daily.  I have sent a new prescription to reflect this. 3. I will refer to cardiology  regarding her palpitations.  She will likely need a Holter monitor. 4. She was given high-dose influenza vaccination today.  I also encouraged her to get the [employer] booster that she is eligible for. 5. Follow-up in 6 weeks.   Meds ordered this encounter  Medications  . NP THYROID 120 MG tablet    Sig: Take 1 tablet (120 mg total) by mouth daily before breakfast.    Dispense:  30 tablet    Refill:  3    Rigdon Macomber Luther Parody, MD

## 2020-09-28 ENCOUNTER — Other Ambulatory Visit (INDEPENDENT_AMBULATORY_CARE_PROVIDER_SITE_OTHER): Payer: Self-pay | Admitting: Internal Medicine

## 2020-09-28 ENCOUNTER — Telehealth (INDEPENDENT_AMBULATORY_CARE_PROVIDER_SITE_OTHER): Payer: Self-pay

## 2020-09-28 MED ORDER — LOSARTAN POTASSIUM 50 MG PO TABS
50.0000 mg | ORAL_TABLET | Freq: Every day | ORAL | 0 refills | Status: DC
Start: 2020-09-28 — End: 2021-01-21

## 2020-09-28 MED ORDER — HYDROCHLOROTHIAZIDE 12.5 MG PO CAPS
12.5000 mg | ORAL_CAPSULE | Freq: Every day | ORAL | 0 refills | Status: DC
Start: 2020-09-28 — End: 2021-01-21

## 2020-09-28 NOTE — Telephone Encounter (Signed)
Patient called and left a detailed voice message that she needs a refill on her BP medications to [facility].  Last OV 09/09/2020  Hydrochlorothiazide 12.5 mg last filled 07/12/2020, # 90 with 0 refills  Losartan 50 mg last filled 07/12/2020, # 90 with 0 refills

## 2020-10-01 ENCOUNTER — Other Ambulatory Visit (INDEPENDENT_AMBULATORY_CARE_PROVIDER_SITE_OTHER): Payer: Self-pay | Admitting: Nurse Practitioner

## 2020-10-01 DIAGNOSIS — N950 Postmenopausal bleeding: Secondary | ICD-10-CM | POA: Diagnosis not present

## 2020-10-01 DIAGNOSIS — N762 Acute vulvitis: Secondary | ICD-10-CM | POA: Diagnosis not present

## 2020-10-01 DIAGNOSIS — R102 Pelvic and perineal pain: Secondary | ICD-10-CM | POA: Diagnosis not present

## 2020-10-05 ENCOUNTER — Telehealth (INDEPENDENT_AMBULATORY_CARE_PROVIDER_SITE_OTHER): Payer: Self-pay

## 2020-10-05 ENCOUNTER — Encounter (INDEPENDENT_AMBULATORY_CARE_PROVIDER_SITE_OTHER): Payer: Self-pay | Admitting: Nurse Practitioner

## 2020-10-05 NOTE — Telephone Encounter (Signed)
Patient called and stated that she needs to have 2 refills sent to [facility] for the following:  estradiol (ESTRACE) 2 MG tablet  Last filled 09/01/2020 # 30 with 0 refills  progesterone (PROMETRIUM) 200 MG capsule   Last filled 09/01/2020 # 60 with 0 refills  Patient is out of this medication.

## 2020-10-05 NOTE — Progress Notes (Signed)
This encounter was created in error - please disregard.

## 2020-10-12 ENCOUNTER — Encounter: Payer: Self-pay | Admitting: Interventional Cardiology

## 2020-10-12 ENCOUNTER — Other Ambulatory Visit: Payer: Self-pay

## 2020-10-12 ENCOUNTER — Ambulatory Visit (INDEPENDENT_AMBULATORY_CARE_PROVIDER_SITE_OTHER): Payer: Medicare Other | Admitting: Interventional Cardiology

## 2020-10-12 ENCOUNTER — Encounter: Payer: Self-pay | Admitting: *Deleted

## 2020-10-12 ENCOUNTER — Other Ambulatory Visit (INDEPENDENT_AMBULATORY_CARE_PROVIDER_SITE_OTHER): Payer: Self-pay | Admitting: Internal Medicine

## 2020-10-12 VITALS — BP 160/82 | HR 86 | Ht 63.0 in | Wt 171.4 lb

## 2020-10-12 DIAGNOSIS — R0602 Shortness of breath: Secondary | ICD-10-CM | POA: Diagnosis not present

## 2020-10-12 DIAGNOSIS — I700 Atherosclerosis of aorta: Secondary | ICD-10-CM

## 2020-10-12 DIAGNOSIS — I10 Essential (primary) hypertension: Secondary | ICD-10-CM | POA: Diagnosis not present

## 2020-10-12 DIAGNOSIS — R002 Palpitations: Secondary | ICD-10-CM

## 2020-10-12 NOTE — Patient Instructions (Signed)
Medication Instructions:  Your physician recommends that you continue on your current medications as directed. Please refer to the Current Medication list given to you today.  *If you need a refill on your cardiac medications before your next appointment, please call your pharmacy*   Lab Work: None  If you have labs (blood work) drawn today and your tests are completely normal, you will receive your results only by: Marland Kitchen MyChart Message (if you have MyChart) OR . A paper copy in the mail If you have any lab test that is abnormal or we need to change your treatment, we will call you to review the results.   Testing/Procedures: Your physician has requested that you have an echocardiogram. Echocardiography is a painless test that uses sound waves to create images of your heart. It provides your doctor with information about the size and shape of your heart and how well your heart's chambers and valves are working. This procedure takes approximately one hour. There are no restrictions for this procedure.  Your physician has recommended that you wear a 14 day monitor. These monitors are medical devices that record the heart's electrical activity. Doctors most often use these monitors to diagnose arrhythmias. Arrhythmias are problems with the speed or rhythm of the heartbeat. The monitor is a small, portable device. You can wear one while you do your normal daily activities. This is usually used to diagnose what is causing palpitations/syncope (passing out).   Follow-Up: Based on test results  Other Instructions ZIO XT- Long Term Monitor Instructions   Your physician has requested you wear your ZIO patch monitor 14 days.   This is a single patch monitor.  [employer] supplies one patch monitor per enrollment.  Additional stickers are not available.   Please do not apply patch if you will be having a Nuclear Stress Test, Echocardiogram, Cardiac CT, MRI, or Chest Xray during the time frame you would  be wearing the monitor. The patch cannot be worn during these tests.  You cannot remove and re-apply the ZIO XT patch monitor.   Your ZIO patch monitor will be sent USPS Priority mail from [facility] directly to your home address. The monitor may also be mailed to a PO BOX if home delivery is not available.   It may take 3-5 days to receive your monitor after you have been enrolled.   Once you have received you monitor, please review enclosed instructions.  Your monitor has already been registered assigning a specific monitor serial # to you.   Applying the monitor   Shave hair from upper left chest.   Hold abrader disc by orange tab.  Rub abrader in 40 strokes over left upper chest as indicated in your monitor instructions.   Clean area with 4 enclosed alcohol pads .  Use all pads to assure are is cleaned thoroughly.  Let dry.   Apply patch as indicated in monitor instructions.  Patch will be place under collarbone on left side of chest with arrow pointing upward.   Rub patch adhesive wings for 2 minutes.Remove white label marked "1".  Remove white label marked "2".  Rub patch adhesive wings for 2 additional minutes.   While looking in a mirror, press and release button in center of patch.  A small green light will flash 3-4 times .  This will be your only indicator the monitor has been turned on.     Do not shower for the first 24 hours.  You may shower after the  first 24 hours.   Press button if you feel a symptom. You will hear a small click.  Record Date, Time and Symptom in the Patient Log Book.   When you are ready to remove patch, follow instructions on last 2 pages of Patient Log Book.  Stick patch monitor onto last page of Patient Log Book.   Place Patient Log Book in [place] box.  Use locking tab on box and tape box closed securely.  The [employer] has [employer] on it.  Please place in mailbox as soon as possible.  Your physician should have your test  results approximately 7 days after the monitor has been mailed back to [facility].   Call [place] at [phone] if you have questions regarding your ZIO XT patch monitor.  Call them immediately if you see an orange light blinking on your monitor.   If your monitor falls off in less than 4 days contact our Monitor department at [phone].  If your monitor becomes loose or falls off after 4 days call [employer] at [phone] for suggestions on securing your monitor.

## 2020-10-12 NOTE — Progress Notes (Signed)
Patient ID: Michele Duncan, female   DOB: Aug 27, 1951, 69 y.o.   MRN: [id number] Patient enrolled for [employer] to ship a 14 day ZIO XT long term holter monitor to her home.

## 2020-10-12 NOTE — Progress Notes (Signed)
Cardiology Office Note   Date:  10/12/2020   ID:  Michele Duncan, DOB 04-06-1951, MRN [id number]  PCP:  [name], MD    No chief complaint on file.  palpitations  Wt Readings from Last 3 Encounters:  10/12/20 171 lb 6.4 oz (77.7 kg)  09/09/20 174 lb (78.9 kg)  06/09/20 174 lb (78.9 kg)       History of Present Illness: Michele Duncan is a 69 y.o. female who is being seen today for the evaluation of palpitations at the request of [name], MD.  Records from PMD show: "she has been complaining of palpitations which typically wake her up at 4-6 AM in the morning.  The palpitations are rapid and regular.  They are not associated with any chest pain.  They last for probably half an hour.  The palpitations have been going on for the last 4 to 6 weeks.  The palpitations do not occur after she takes her thyroid medications in the morning."  She has had a lot of stress due to her husband's stage IV cancer- receiving Keytruda.    She does not do much walking.  She gets fatigued easily.  Thyroid medicine was increased but no improvement.    She continues have the palpitations.  They can wake her from sleep.    Denies : Chest pain. Dizziness. Leg edema. Nitroglycerin use. Orthopnea. Palpitations. Paroxysmal nocturnal dyspnea. Syncope.   Past Medical History:  Diagnosis Date  . Anxiety   . Arthritis    low back  . Chronic anal fissure   . Chronic low back pain   . Essential hypertension, benign 10/17/2019  . GERD (gastroesophageal reflux disease) 10/17/2019  . Grade II internal hemorrhoids   . Hemorrhoids, external   . History of adenomatous polyp of colon    11-06-2017  . HLD (hyperlipidemia) 10/17/2019  . Hot flashes due to menopause 10/17/2019  . HTN (hypertension)   . Vitamin D deficiency disease 10/17/2019  . Wears glasses     Past Surgical History:  Procedure Laterality Date  . BREAST BIOPSY Left 1970s   benign   . COLONOSCOPY N/A 11/06/2017    Procedure: COLONOSCOPY;  Surgeon: Danie Binder, MD;  [facility];  Service: Endoscopy;  Laterality: N/A;  1:45pm  . HEMORRHOID SURGERY N/A 02/28/2018   Procedure: HEMORRHOIDECTOMY;  Surgeon: Michael Boston, MD;  Location: [facility];  Service: General;  Laterality: N/A;  . SPHINCTEROTOMY N/A 02/28/2018   Procedure: LATERAL SPHINCTEROTOMY;  Surgeon: Michael Boston, MD;  Location: [place];  Service: General;  Laterality: N/A;     Current Outpatient Medications  Medication Sig Dispense Refill  . acetaminophen (TYLENOL) 650 MG CR tablet Take 650 mg by mouth every 8 (eight) hours as needed for pain.    Marland Kitchen aluminum hydroxide-magnesium carbonate (GAVISCON) 95-358 MG/15ML SUSP Take 15 mLs by mouth as needed for indigestion.    . Cholecalciferol (VITAMIN D-3 PO) Take 7,000 Int'l Units by mouth daily.    . diclofenac (VOLTAREN) 75 MG EC tablet TAKE 1 TABLET BY MOUTH TWICE DAILY WITH MEALS. 60 tablet 5  . estradiol (ESTRACE) 2 MG tablet TAKE ONE TABLET BY MOUTH ONCE DAILY. 30 tablet 0  . Estradiol Micronized (EC-RX ESTRADIOL TD) Place 0.5 mLs onto the skin 2 (two) times a week. Compounded cream from [place]    . hydrochlorothiazide (MICROZIDE) 12.5 MG capsule Take 1 capsule (12.5 mg total) by mouth daily. [place]  capsule 0  . losartan (COZAAR) 50 MG tablet Take 1 tablet (50 mg total) by mouth daily. 90 tablet 0  . naproxen (NAPROSYN) 500 MG tablet TAKE 1 TABLET TWICE DAILY  WITH MEALS 180 tablet 0  . NP THYROID 120 MG tablet Take 1 tablet (120 mg total) by mouth daily before breakfast. 30 tablet 3  . NP THYROID 90 MG tablet TAKE 1 TABLET(90 MG) BY MOUTH DAILY 90 tablet 0  . pantoprazole (PROTONIX) 40 MG tablet TAKE ONE TABLET BY MOUTH TWICE A DAY 180 tablet 0  . potassium chloride SA (KLOR-CON) 20 MEQ tablet Take 2 tablets (40 mEq total) by mouth every morning. 180 tablet 1  . progesterone (PROMETRIUM) 200 MG capsule TAKE 2 CAPSULES BY MOUTH ONCE DAILY.  60 capsule 0   No current facility-administered medications for this visit.    Allergies:   Penicillins and Codeine    Social History:  The patient  reports that she quit smoking about 6 years ago. Her smoking use included cigarettes. She has never used smokeless tobacco. She reports that she does not drink alcohol and does not use drugs.   Family History:  The patient's family history includes Autoimmune disease in her sister; Cancer in her mother; Colon cancer in her paternal grandmother; Heart disease in her father; Hypertension in her father; Lung disease in her sister.    ROS:  Please see the history of present illness.   Otherwise, review of systems are positive for increased stress.   All other systems are reviewed and negative.    PHYSICAL EXAM: VS:  BP (!) 160/82   Pulse 86   Ht 5\' 3"  (1.6 m)   Wt 171 lb 6.4 oz (77.7 kg)   SpO2 96%   BMI 30.36 kg/m  , BMI Body mass index is 30.36 kg/m. GEN: Well nourished, well developed, in no acute distress  HEENT: normal  Neck: no JVD, carotid bruits, or masses Cardiac: RRR; no murmurs, rubs, or gallops,no edema  Respiratory:  clear to auscultation bilaterally, normal work of breathing GI: soft, nontender, nondistended, + BS MS: no deformity or atrophy  Skin: warm and dry, no rash Neuro:  Strength and sensation are intact Psych: euthymic mood, full affect   EKG:   The ekg ordered today demonstrates NSR, no ST changes   Recent Labs: 10/17/2019: ALT 13; BUN 11; Creat 0.86; Potassium 4.3; Sodium 137 06/09/2020: TSH 1.32   Lipid Panel No results found for: CHOL, TRIG, HDL, CHOLHDL, VLDL, LDLCALC, LDLDIRECT   Other studies Reviewed: Additional studies/ records that were reviewed today with results demonstrating: labs reviewed; records from PMD reviewed.   ASSESSMENT AND PLAN:  1. Palpitations: Plan for 2 week Zio patch.  No high risk features.  No syncope.   2. SOB: Plan to check echo to eval for structural heart disease.   3. HTN: Well controlled at home.  WOuld not change meds based on today's BP reading.  4. Aortic atherosclerosis.  Noted by abdominal CT in 2019.  Would try to keep LDL < 100.  I do not have the latest results at this time.      Current medicines are reviewed at length with the patient today.  The patient concerns regarding her medicines were addressed.  The following changes have been made:  No change  Labs/ tests ordered today include: echo, Zio patch No orders of the defined types were placed in this encounter.   Recommend 150 minutes/week of aerobic exercise Low fat, low  carb, high fiber diet recommended  Disposition:   FU based on results   Signed, Larae Grooms, MD  10/12/2020 11:23 AM    [facility] [place], [place], [place]  [address] Phone: [phone]; Fax: [phone]

## 2020-10-15 ENCOUNTER — Ambulatory Visit (HOSPITAL_COMMUNITY)
Admission: RE | Admit: 2020-10-15 | Discharge: 2020-10-15 | Disposition: A | Discharge status: Home / Self Care | Payer: Medicare Other | Source: Ambulatory Visit | Attending: Interventional Cardiology | Admitting: Interventional Cardiology

## 2020-10-15 ENCOUNTER — Other Ambulatory Visit: Payer: Self-pay

## 2020-10-15 DIAGNOSIS — R0602 Shortness of breath: Secondary | ICD-10-CM | POA: Insufficient documentation

## 2020-10-15 DIAGNOSIS — R002 Palpitations: Secondary | ICD-10-CM | POA: Diagnosis not present

## 2020-10-15 LAB — ECHOCARDIOGRAM COMPLETE
Area-P 1/2: 3.12 cm2
S' Lateral: 2.67 cm

## 2020-10-15 NOTE — Progress Notes (Signed)
*  PRELIMINARY RESULTS* Echocardiogram 2D Echocardiogram has been performed.  Michele Duncan 10/15/2020, 2:43 PM

## 2020-10-19 ENCOUNTER — Encounter (INDEPENDENT_AMBULATORY_CARE_PROVIDER_SITE_OTHER): Payer: Self-pay | Admitting: Internal Medicine

## 2020-10-19 ENCOUNTER — Ambulatory Visit (INDEPENDENT_AMBULATORY_CARE_PROVIDER_SITE_OTHER): Payer: Medicare Other | Admitting: Internal Medicine

## 2020-10-19 ENCOUNTER — Other Ambulatory Visit: Payer: Self-pay

## 2020-10-19 VITALS — BP 142/88 | HR 95 | Temp 96.8°F | Ht 63.0 in | Wt 174.0 lb

## 2020-10-19 DIAGNOSIS — Z1231 Encounter for screening mammogram for malignant neoplasm of breast: Secondary | ICD-10-CM

## 2020-10-19 DIAGNOSIS — I10 Essential (primary) hypertension: Secondary | ICD-10-CM | POA: Diagnosis not present

## 2020-10-19 DIAGNOSIS — E559 Vitamin D deficiency, unspecified: Secondary | ICD-10-CM

## 2020-10-19 DIAGNOSIS — N951 Menopausal and female climacteric states: Secondary | ICD-10-CM | POA: Diagnosis not present

## 2020-10-19 DIAGNOSIS — R5383 Other fatigue: Secondary | ICD-10-CM | POA: Diagnosis not present

## 2020-10-19 DIAGNOSIS — R5381 Other malaise: Secondary | ICD-10-CM

## 2020-10-19 NOTE — Progress Notes (Signed)
Metrics: Intervention Frequency ACO  Documented Smoking Status Yearly  Screened one or more times in 24 months  Cessation Counseling or  Active cessation medication Past 24 months  Past 24 months   Guideline developer: UpToDate (See UpToDate for funding source) Date Released: 2014       Wellness Office Visit  Subjective:  Patient ID: Michele Duncan, female    DOB: November 11, 1951  Age: 69 y.o. MRN: [id number]  CC: This lady comes in for follow-up of vagina dryness, hypertension, vitamin D deficiency and menopausal symptoms. HPI On the last visit, I had prescribed for a compounded cream consisting of estradiol and testosterone for her vagina dryness but this is really not helped at all.  In the meantime, she has seen her OB/GYN and she is due now to have a D&C done soon.  She will follow up with the OB/GYN in December. She also feels very fatigued.  We had increased the NP thyroid dose 120 mg every other day alternating with NP thyroid 90 mg.  She is tolerating the higher dose. She continues on estradiol and progesterone orally. She continues on losartan for hypertension.  Past Medical History:  Diagnosis Date  . Anxiety   . Arthritis    low back  . Chronic anal fissure   . Chronic low back pain   . Essential hypertension, benign 10/17/2019  . GERD (gastroesophageal reflux disease) 10/17/2019  . Grade II internal hemorrhoids   . Hemorrhoids, external   . History of adenomatous polyp of colon    11-06-2017  . HLD (hyperlipidemia) 10/17/2019  . Hot flashes due to menopause 10/17/2019  . HTN (hypertension)   . Vitamin D deficiency disease 10/17/2019  . Wears glasses    Past Surgical History:  Procedure Laterality Date  . BREAST BIOPSY Left 1970s   benign   . COLONOSCOPY N/A 11/06/2017   Procedure: COLONOSCOPY;  Surgeon: Danie Binder, MD;  [facility];  Service: Endoscopy;  Laterality: N/A;  1:45pm  . HEMORRHOID SURGERY N/A 02/28/2018   Procedure: HEMORRHOIDECTOMY;   Surgeon: Michael Boston, MD;  Location: [facility];  Service: General;  Laterality: N/A;  . SPHINCTEROTOMY N/A 02/28/2018   Procedure: LATERAL SPHINCTEROTOMY;  Surgeon: Michael Boston, MD;  Location: [place];  Service: General;  Laterality: N/A;     Family History  Problem Relation Age of Onset  . Colon cancer Paternal Grandmother   . Cancer Mother   . Hypertension Father   . Heart disease Father   . Autoimmune disease Sister   . Lung disease Sister   . 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   Social History   Tobacco Use  . Smoking status: Former Smoker    Types: Cigarettes    Quit date: 12/05/2013    Years since quitting: 6.8  . Smokeless tobacco: Never Used  Substance Use Topics  . Alcohol use: No    Current Meds  Medication Sig  . acetaminophen (TYLENOL) 650 MG CR tablet Take 650 mg by mouth every 8 (eight) hours as needed for pain.  Marland Kitchen aluminum hydroxide-magnesium carbonate (GAVISCON) 95-358 MG/15ML SUSP Take 15 mLs by mouth as needed for indigestion.  . Cholecalciferol (VITAMIN D-3 PO) Take 7,000 Int'l Units by mouth daily.  . diclofenac (VOLTAREN) 75 MG EC tablet TAKE 1 TABLET BY MOUTH TWICE DAILY WITH MEALS.  Marland Kitchen estradiol (ESTRACE)  2 MG tablet TAKE ONE TABLET BY MOUTH ONCE DAILY.  Marland Kitchen Estradiol Micronized (EC-RX ESTRADIOL TD) Place 0.5 mLs onto the skin 2 (two) times a week. Compounded cream from [place]  . hydrochlorothiazide (MICROZIDE) 12.5 MG capsule Take 1 capsule (12.5 mg total) by mouth daily.  Marland Kitchen losartan (COZAAR) 50 MG tablet Take 1 tablet (50 mg total) by mouth daily.  . naproxen (NAPROSYN) 500 MG tablet TAKE 1 TABLET TWICE DAILY  WITH MEALS  . NP THYROID 120 MG tablet Take 1 tablet (120 mg total) by mouth daily before breakfast.  . NP THYROID 90 MG tablet TAKE 1 TABLET(90 MG) BY MOUTH DAILY  . pantoprazole (PROTONIX) 40  MG tablet TAKE ONE TABLET BY MOUTH TWICE A DAY  . potassium chloride SA (KLOR-CON) 20 MEQ tablet Take 2 tablets (40 mEq total) by mouth every morning.  . progesterone (PROMETRIUM) 200 MG capsule TAKE 2 CAPSULES BY MOUTH ONCE DAILY.      Depression screen [facility] 2/9 10/19/2020 01/21/2020  Decreased Interest 0 0  Down, Depressed, Hopeless 0 0  PHQ - 2 Score 0 0  Altered sleeping 0 -  Tired, decreased energy 0 -  Change in appetite 0 -  Feeling bad or failure about yourself  0 -  Trouble concentrating 0 -  Moving slowly or fidgety/restless 0 -  Suicidal thoughts 0 -  PHQ-9 Score 0 -  Difficult doing work/chores Not difficult at all -     Objective:   Today's Vitals: BP (!) 142/88   Pulse 95   Temp (!) 96.8 F (36 C) (Temporal)   Ht 5\' 3"  (1.6 m)   Wt 174 lb (78.9 kg)   SpO2 98%   BMI 30.82 kg/m  Vitals with BMI 10/19/2020 10/12/2020 09/09/2020  Height 5\' 3"  5\' 3"  5\' 3"   Weight 174 lbs 171 lbs 6 oz 174 lbs  BMI 30.83 42.35 36.14  Systolic 431 540 086  Diastolic 88 82 80  Pulse 95 86 77     Physical Exam She looks systemically well.  She remains obese.  Blood pressure is acceptable today.  Alert and orientated without any focal neurological signs.      Assessment   1. Malaise and fatigue   2. Hot flashes due to menopause   3. Essential hypertension, benign   4. Vitamin D deficiency disease   5. Encounter for screening mammogram for malignant neoplasm of breast       Tests ordered Orders Placed This Encounter  Procedures  . MM 3D SCREEN BREAST BILATERAL     Plan: 1. She will continue with losartan as before for hypertension. 2. She will continue with bioidentical hormone therapy as before. 3. I will await the results of the D&C before further recommendations. 4. In the meantime, we will order a mammogram as she has not had 1 for some time and she is due. 5. Follow-up in January.   No orders of the defined types were placed in this encounter.   [name], MD

## 2020-10-22 ENCOUNTER — Ambulatory Visit (INDEPENDENT_AMBULATORY_CARE_PROVIDER_SITE_OTHER): Payer: Medicare Other | Admitting: Internal Medicine

## 2020-10-22 DIAGNOSIS — N950 Postmenopausal bleeding: Secondary | ICD-10-CM | POA: Diagnosis not present

## 2020-10-22 DIAGNOSIS — N840 Polyp of corpus uteri: Secondary | ICD-10-CM | POA: Diagnosis not present

## 2020-10-27 ENCOUNTER — Ambulatory Visit (INDEPENDENT_AMBULATORY_CARE_PROVIDER_SITE_OTHER): Payer: Medicare Other

## 2020-10-27 DIAGNOSIS — R0602 Shortness of breath: Secondary | ICD-10-CM | POA: Diagnosis not present

## 2020-10-27 DIAGNOSIS — R002 Palpitations: Secondary | ICD-10-CM | POA: Diagnosis not present

## 2020-10-28 ENCOUNTER — Other Ambulatory Visit (INDEPENDENT_AMBULATORY_CARE_PROVIDER_SITE_OTHER): Payer: Self-pay | Admitting: Nurse Practitioner

## 2020-10-28 ENCOUNTER — Encounter: Payer: Self-pay | Admitting: Internal Medicine

## 2020-11-02 ENCOUNTER — Telehealth (INDEPENDENT_AMBULATORY_CARE_PROVIDER_SITE_OTHER): Payer: Self-pay | Admitting: Nurse Practitioner

## 2020-11-02 DIAGNOSIS — Z1211 Encounter for screening for malignant neoplasm of colon: Secondary | ICD-10-CM

## 2020-11-02 NOTE — Telephone Encounter (Signed)
Please call this patient and let her know that I have a reminder that was sent to me but me know that she is due for colon cancer screening again via colonoscopy next month.  Please ask if she is willing to undergo this screening, and if so I will order referral to gastroenterologist.  Thank you.

## 2020-11-02 NOTE — Addendum Note (Signed)
Addended by: Ailene Ards on: 11/02/2020 09:39 AM   Modules accepted: Orders

## 2020-11-02 NOTE — Telephone Encounter (Signed)
Ordering referral to GI for colon cancer screening via colonoscopy. She used to see Dr. Oneida Alar.

## 2020-11-02 NOTE — Telephone Encounter (Signed)
Called patient and gave her the message and she agreed to have the colonoscopy and stated OK per her to send in the referral. Patient verbalized an understanding.

## 2020-11-05 DIAGNOSIS — Z09 Encounter for follow-up examination after completed treatment for conditions other than malignant neoplasm: Secondary | ICD-10-CM | POA: Diagnosis not present

## 2020-11-05 DIAGNOSIS — N950 Postmenopausal bleeding: Secondary | ICD-10-CM | POA: Diagnosis not present

## 2020-11-05 DIAGNOSIS — E039 Hypothyroidism, unspecified: Secondary | ICD-10-CM | POA: Diagnosis not present

## 2020-11-06 ENCOUNTER — Encounter: Payer: Self-pay | Admitting: Internal Medicine

## 2020-11-09 ENCOUNTER — Ambulatory Visit (HOSPITAL_COMMUNITY): Payer: Medicare Other

## 2020-11-12 ENCOUNTER — Ambulatory Visit (HOSPITAL_COMMUNITY)
Admission: RE | Admit: 2020-11-12 | Discharge: 2020-11-12 | Disposition: A | Discharge status: Home / Self Care | Payer: Medicare Other | Source: Ambulatory Visit | Attending: Internal Medicine | Admitting: Internal Medicine

## 2020-11-12 ENCOUNTER — Other Ambulatory Visit: Payer: Self-pay

## 2020-11-12 ENCOUNTER — Encounter (HOSPITAL_COMMUNITY): Payer: Self-pay

## 2020-11-12 DIAGNOSIS — Z1231 Encounter for screening mammogram for malignant neoplasm of breast: Secondary | ICD-10-CM | POA: Insufficient documentation

## 2020-11-18 DIAGNOSIS — R0602 Shortness of breath: Secondary | ICD-10-CM | POA: Diagnosis not present

## 2020-11-18 DIAGNOSIS — R002 Palpitations: Secondary | ICD-10-CM | POA: Diagnosis not present

## 2020-11-19 ENCOUNTER — Telehealth: Payer: Self-pay | Admitting: Interventional Cardiology

## 2020-11-19 NOTE — Telephone Encounter (Signed)
Called and made patient aware that Dr. Irish Lack will need to review her monitor results. Made patient aware that we will call her with those results and any recommendations that he may have. Patient verbalized understanding and thanked me for the call.

## 2020-11-19 NOTE — Telephone Encounter (Signed)
Patient wanted to know if Dr. Irish Lack has gotten the results from her heart monitor yet. Please advise

## 2020-11-20 NOTE — Telephone Encounter (Signed)
done

## 2020-12-09 ENCOUNTER — Ambulatory Visit (INDEPENDENT_AMBULATORY_CARE_PROVIDER_SITE_OTHER): Payer: Medicare Other | Admitting: Internal Medicine

## 2020-12-09 ENCOUNTER — Other Ambulatory Visit: Payer: Self-pay

## 2020-12-09 ENCOUNTER — Encounter (INDEPENDENT_AMBULATORY_CARE_PROVIDER_SITE_OTHER): Payer: Self-pay | Admitting: Internal Medicine

## 2020-12-09 VITALS — BP 124/78 | HR 98 | Temp 96.8°F | Ht 63.0 in | Wt 174.4 lb

## 2020-12-09 DIAGNOSIS — N950 Postmenopausal bleeding: Secondary | ICD-10-CM | POA: Diagnosis not present

## 2020-12-09 DIAGNOSIS — R5381 Other malaise: Secondary | ICD-10-CM

## 2020-12-09 DIAGNOSIS — N951 Menopausal and female climacteric states: Secondary | ICD-10-CM

## 2020-12-09 DIAGNOSIS — R5383 Other fatigue: Secondary | ICD-10-CM

## 2020-12-09 MED ORDER — PROGESTERONE 200 MG PO CAPS: 600.0000 mg | ORAL_CAPSULE | Freq: Every evening | ORAL | 3 refills | Status: DC

## 2020-12-09 NOTE — Progress Notes (Signed)
Metrics: Intervention Frequency ACO  Documented Smoking Status Yearly  Screened one or more times in 24 months  Cessation Counseling or  Active cessation medication Past 24 months  Past 24 months   Guideline developer: UpToDate (See UpToDate for funding source) Date Released: 2014       Wellness Office Visit  Subjective:  Patient ID: Michele Duncan, female    DOB: 10/20/1951  Age: 70 y.o. MRN: [id number]  CC: This lady comes in for follow-up of her postmenopausal symptoms, postmenopausal bleeding and burning sensation that she has in the lower abdomen and pelvic area. HPI  As far as the postmenopausal bleeding is concerned, she did eventually have a D&C and apparently biopsy did not show any malignancy.  She still has some spotting. Her OB/GYN also did some thyroid function tests and was alarmed at low TSH which is not surprising with desiccated NP thyroid. Past Medical History:  Diagnosis Date  . Anxiety   . Arthritis    low back  . Chronic anal fissure   . Chronic low back pain   . Essential hypertension, benign 10/17/2019  . GERD (gastroesophageal reflux disease) 10/17/2019  . Grade II internal hemorrhoids   . Hemorrhoids, external   . History of adenomatous polyp of colon    11-06-2017  . HLD (hyperlipidemia) 10/17/2019  . Hot flashes due to menopause 10/17/2019  . HTN (hypertension)   . Vitamin D deficiency disease 10/17/2019  . Wears glasses    Past Surgical History:  Procedure Laterality Date  . BREAST BIOPSY Left 1970s   benign   . COLONOSCOPY N/A 11/06/2017   Procedure: COLONOSCOPY;  Surgeon: Danie Binder, MD;  [facility];  Service: Endoscopy;  Laterality: N/A;  1:45pm  . HEMORRHOID SURGERY N/A 02/28/2018   Procedure: HEMORRHOIDECTOMY;  Surgeon: Michael Boston, MD;  Location: [facility];  Service: General;  Laterality: N/A;  . SPHINCTEROTOMY N/A 02/28/2018   Procedure: LATERAL SPHINCTEROTOMY;  Surgeon: Michael Boston, MD;  Location:  [place];  Service: General;  Laterality: N/A;     Family History  Problem Relation Age of Onset  . Colon cancer Paternal Grandmother   . Breast cancer Mother   . Hypertension Father   . Heart disease Father   . Autoimmune disease Sister   . Lung disease Sister   . Breast cancer Maternal Aunt   . Breast cancer Maternal Grandmother   . 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   Social History   Tobacco Use  . Smoking status: Former Smoker    Types: Cigarettes    Quit date: 12/05/2013    Years since quitting: 7.0  . Smokeless tobacco: Never Used  Substance Use Topics  . Alcohol use: No    Current Meds  Medication Sig  . acetaminophen (TYLENOL) 650 MG CR tablet Take 650 mg by mouth every 8 (eight) hours as needed for pain.  Marland Kitchen aluminum hydroxide-magnesium carbonate (GAVISCON) 95-358 MG/15ML SUSP Take 15 mLs by mouth as needed for indigestion.  . Cholecalciferol (VITAMIN D-3 PO) Take 7,000 Int'l Units by mouth daily.  . diclofenac (VOLTAREN) 75 MG EC tablet TAKE 1 TABLET BY MOUTH TWICE DAILY WITH MEALS.  Marland Kitchen estradiol (ESTRACE) 2 MG tablet TAKE ONE TABLET BY MOUTH ONCE DAILY.  . hydrochlorothiazide (MICROZIDE) 12.5 MG capsule Take 1 capsule (12.5 mg total) by mouth daily.  Marland Kitchen  losartan (COZAAR) 50 MG tablet Take 1 tablet (50 mg total) by mouth daily.  . naproxen (NAPROSYN) 500 MG tablet TAKE 1 TABLET TWICE DAILY  WITH MEALS  . NP THYROID 120 MG tablet Take 1 tablet (120 mg total) by mouth daily before breakfast.  . pantoprazole (PROTONIX) 40 MG tablet TAKE ONE TABLET BY MOUTH TWICE A DAY  . potassium chloride SA (KLOR-CON) 20 MEQ tablet Take 2 tablets (40 mEq total) by mouth every morning.  . [DISCONTINUED] Estradiol Micronized (EC-RX ESTRADIOL TD) Place 0.5 mLs onto the skin 2 (two) times a week. Compounded cream from [place]  . [DISCONTINUED] NP THYROID  90 MG tablet TAKE 1 TABLET(90 MG) BY MOUTH DAILY  . [DISCONTINUED] progesterone (PROMETRIUM) 200 MG capsule TAKE 2 CAPSULES BY MOUTH ONCE DAILY.      Depression screen [facility] 2/9 10/19/2020 01/21/2020  Decreased Interest 0 0  Down, Depressed, Hopeless 0 0  PHQ - 2 Score 0 0  Altered sleeping 0 -  Tired, decreased energy 0 -  Change in appetite 0 -  Feeling bad or failure about yourself  0 -  Trouble concentrating 0 -  Moving slowly or fidgety/restless 0 -  Suicidal thoughts 0 -  PHQ-9 Score 0 -  Difficult doing work/chores Not difficult at all -     Objective:   Today's Vitals: BP 124/78   Pulse 98   Temp (!) 96.8 F (36 C) (Temporal)   Ht 5\' 3"  (1.6 m)   Wt 174 lb 6.4 oz (79.1 kg)   SpO2 99%   BMI 30.89 kg/m  Vitals with BMI 12/09/2020 10/19/2020 10/12/2020  Height 5\' 3"  5\' 3"  5\' 3"   Weight 174 lbs 6 oz 174 lbs 171 lbs 6 oz  BMI 30.9 30.83 30.37  Systolic 124 142 13/07/2020  Diastolic 78 88 82  Pulse 98 95 86     Physical Exam  She looks systemically well.  No new physical findings.     Assessment   1. Hot flashes due to menopause   2. Postmenopausal bleeding   3. Malaise and fatigue       Tests ordered No orders of the defined types were placed in this encounter.    Plan: 1. As far as the postmenopausal bleeding is concerned, I recommended that she discontinue estradiol altogether for right now and increase the progesterone to 600 mg at night and I have sent a new prescription to reflect this. 2. As far as the burning sensation is concerned, if there is concerned that thyroid might be causing this, I have recommended that she discontinue the thyroid altogether.  After about 2 weeks, if the burning is gone, she should rechallenge with thyroid, or she can wait till I see her the next time which will be in about 1 month.   Meds ordered this encounter  Medications  . progesterone (PROMETRIUM) 200 MG capsule    Sig: Take 3 capsules (600 mg total) by mouth at  bedtime.    Dispense:  90 capsule    Refill:  3    New dose    [name] , MD

## 2020-12-10 ENCOUNTER — Ambulatory Visit: Payer: Medicare Other | Attending: Internal Medicine

## 2020-12-10 DIAGNOSIS — Z23 Encounter for immunization: Secondary | ICD-10-CM

## 2020-12-10 NOTE — Progress Notes (Signed)
   Covid-19 Vaccination Clinic  Name:  Michele Duncan    MRN: [id number] DOB: 1951-01-14  12/10/2020  [name] was observed post Covid-19 immunization for 15 minutes without incident. She was provided with Vaccine Information Sheet and instruction to access the V-Safe system.   [name] was instructed to call 911 with any severe reactions post vaccine: Marland Kitchen Difficulty breathing  . Swelling of face and throat  . A fast heartbeat  . A bad rash all over body  . Dizziness and weakness   Immunizations Administered    Name Date Dose VIS Date Route   Pfizer COVID-19 Vaccine 12/10/2020  2:41 PM 0.3 mL 09/23/2020 Intramuscular   Manufacturer: [employer], [employer]   Lot: [id number]   NDC: [address]-6

## 2020-12-14 ENCOUNTER — Other Ambulatory Visit (INDEPENDENT_AMBULATORY_CARE_PROVIDER_SITE_OTHER): Payer: Self-pay | Admitting: Internal Medicine

## 2020-12-14 ENCOUNTER — Telehealth (INDEPENDENT_AMBULATORY_CARE_PROVIDER_SITE_OTHER): Payer: Self-pay

## 2020-12-14 MED ORDER — PANTOPRAZOLE SODIUM 40 MG PO TBEC
40.0000 mg | DELAYED_RELEASE_TABLET | Freq: Two times a day (BID) | ORAL | 0 refills | Status: DC
Start: 2020-12-14 — End: 2021-12-14

## 2020-12-14 NOTE — Telephone Encounter (Signed)
Received a fax from [place] for a refill request of the following medication:  pantoprazole (PROTONIX) 40 MG tablet Last filled 06/01/2020, # 180 with 0 refills  Last OV 12/09/2020  Next OV 01/18/2021 and 02/01/2021

## 2020-12-15 ENCOUNTER — Ambulatory Visit: Payer: Medicare Other

## 2020-12-18 ENCOUNTER — Encounter (INDEPENDENT_AMBULATORY_CARE_PROVIDER_SITE_OTHER): Payer: Self-pay | Admitting: Internal Medicine

## 2020-12-27 ENCOUNTER — Other Ambulatory Visit (INDEPENDENT_AMBULATORY_CARE_PROVIDER_SITE_OTHER): Payer: Self-pay | Admitting: Nurse Practitioner

## 2021-01-05 ENCOUNTER — Other Ambulatory Visit: Payer: Self-pay

## 2021-01-05 ENCOUNTER — Ambulatory Visit: Payer: Self-pay

## 2021-01-18 ENCOUNTER — Other Ambulatory Visit: Payer: Self-pay

## 2021-01-18 ENCOUNTER — Encounter (INDEPENDENT_AMBULATORY_CARE_PROVIDER_SITE_OTHER): Payer: Self-pay | Admitting: Internal Medicine

## 2021-01-18 ENCOUNTER — Ambulatory Visit (INDEPENDENT_AMBULATORY_CARE_PROVIDER_SITE_OTHER): Payer: Medicare Other | Admitting: Internal Medicine

## 2021-01-18 VITALS — BP 130/80 | HR 72 | Ht 64.0 in | Wt 171.8 lb

## 2021-01-18 DIAGNOSIS — R5381 Other malaise: Secondary | ICD-10-CM

## 2021-01-18 DIAGNOSIS — R5383 Other fatigue: Secondary | ICD-10-CM | POA: Diagnosis not present

## 2021-01-18 DIAGNOSIS — I10 Essential (primary) hypertension: Secondary | ICD-10-CM

## 2021-01-18 DIAGNOSIS — N950 Postmenopausal bleeding: Secondary | ICD-10-CM

## 2021-01-18 DIAGNOSIS — N951 Menopausal and female climacteric states: Secondary | ICD-10-CM | POA: Diagnosis not present

## 2021-01-18 NOTE — Progress Notes (Signed)
Metrics: Intervention Frequency ACO  Documented Smoking Status Yearly  Screened one or more times in 24 months  Cessation Counseling or  Active cessation medication Past 24 months  Past 24 months   Guideline developer: UpToDate (See UpToDate for funding source) Date Released: 2014       Wellness Office Visit  Subjective:  Patient ID: Michele Duncan, female    DOB: 21-Oct-1951  Age: 70 y.o. MRN: [id number]  CC: This lady comes in for follow-up of postmenopausal bleeding and burning sensation in the pelvic area. HPI  On the last visit, I recommended that she completely stop estradiol and increase progesterone levels.  In fact, although she did stop the estradiol, she has been taking progesterone for 100 mg every night instead of 600 mg every night.  Doing this, the bleeding has stopped. We also decided to stop the NP thyroid completely to see if the burning sensation would resolve.  She says that the burning sensation has not resolved but is somewhat better. Past Medical History:  Diagnosis Date  . Anxiety   . Arthritis    low back  . Chronic anal fissure   . Chronic low back pain   . Essential hypertension, benign 10/17/2019  . GERD (gastroesophageal reflux disease) 10/17/2019  . Grade II internal hemorrhoids   . Hemorrhoids, external   . History of adenomatous polyp of colon    11-06-2017  . HLD (hyperlipidemia) 10/17/2019  . Hot flashes due to menopause 10/17/2019  . HTN (hypertension)   . Vitamin D deficiency disease 10/17/2019  . Wears glasses    Past Surgical History:  Procedure Laterality Date  . BREAST BIOPSY Left 1970s   benign   . COLONOSCOPY N/A 11/06/2017   Procedure: COLONOSCOPY;  Surgeon: Danie Binder, MD;  [facility];  Service: Endoscopy;  Laterality: N/A;  1:45pm  . HEMORRHOID SURGERY N/A 02/28/2018   Procedure: HEMORRHOIDECTOMY;  Surgeon: Michael Boston, MD;  Location: [facility];  Service: General;  Laterality: N/A;  .  SPHINCTEROTOMY N/A 02/28/2018   Procedure: LATERAL SPHINCTEROTOMY;  Surgeon: Michael Boston, MD;  Location: [place];  Service: General;  Laterality: N/A;     Family History  Problem Relation Age of Onset  . Colon cancer Paternal Grandmother   . Breast cancer Mother   . Hypertension Father   . Heart disease Father   . Autoimmune disease Sister   . Lung disease Sister   . Breast cancer Maternal Aunt   . Breast cancer Maternal Grandmother   . 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   Social History   Tobacco Use  . Smoking status: Former Smoker    Types: Cigarettes    Quit date: 12/05/2013    Years since quitting: 7.1  . Smokeless tobacco: Never Used  Substance Use Topics  . Alcohol use: No    Current Meds  Medication Sig  . acetaminophen (TYLENOL) 650 MG CR tablet Take 650 mg by mouth every 8 (eight) hours as needed for pain.  . Cholecalciferol (VITAMIN D-3 PO) Take 7,000 Int'l Units by mouth daily.  . hydrochlorothiazide (MICROZIDE) 12.5 MG capsule Take 1 capsule (12.5 mg total) by mouth daily.  Marland Kitchen losartan (COZAAR) 50 MG tablet Take 1 tablet (50 mg total) by mouth daily.  . naproxen (NAPROSYN) 500 MG tablet TAKE 1 TABLET TWICE DAILY  WITH MEALS  .  pantoprazole (PROTONIX) 40 MG tablet Take 1 tablet (40 mg total) by mouth 2 (two) times daily.  . potassium chloride SA (KLOR-CON) 20 MEQ tablet Take 2 tablets (40 mEq total) by mouth every morning.  . progesterone (PROMETRIUM) 200 MG capsule Take 3 capsules (600 mg total) by mouth at bedtime. (Patient taking differently: Take 400 mg by mouth at bedtime.)     Allyn Visit from 10/19/2020 in [facility]  PHQ-9 Total Score 0      Objective:   Today's Vitals: BP 130/80   Pulse 72   Ht 5\' 4"  (1.626 m)   Wt 171 lb 12.8 oz (77.9 kg)   BMI 29.49 kg/m  Vitals with BMI 01/18/2021 12/09/2020  10/19/2020  Height 5\' 4"  5\' 3"  5\' 3"   Weight 171 lbs 13 oz 174 lbs 6 oz 174 lbs  BMI 29.47 53.0 05.11  Systolic 021 117 356  Diastolic 80 78 88  Pulse 72 98 95     Physical Exam  He looks systemically well, remains overweight.  She has lost 3 pounds compared to the last visit.  Blood pressure is in a good range.     Assessment   1. Postmenopausal bleeding   2. Hot flashes due to menopause   3. Malaise and fatigue   4. Essential hypertension, benign       Tests ordered Orders Placed This Encounter  Procedures  . Estradiol  . Progesterone  . T3, free  . T4, free  . TSH  . COMPLETE METABOLIC PANEL WITH GFR     Plan: 1. I am glad that the postmenopausal bleeding has stopped, likely related to higher estradiol levels.  We will check levels again in we can probably try to reintroduce estradiol but may be not get to the higher dose that she was on previously. 2. Check thyroid function again and then see if we can reintroduce NP thyroid. 3. Continue with antihypertensive medications above and check renal function today. 4. Further recommendations will depend on blood results and I will see her for follow-up in 3 months.   No orders of the defined types were placed in this encounter.   [name], MD

## 2021-01-19 LAB — COMPLETE METABOLIC PANEL WITH GFR
AG Ratio: 1.8 (calc) (ref 1.0–2.5)
ALT: 15 U/L (ref 6–29)
AST: 14 U/L (ref 10–35)
Albumin: 4.3 g/dL (ref 3.6–5.1)
Alkaline phosphatase (APISO): 59 U/L (ref 37–153)
BUN: 14 mg/dL (ref 7–25)
CO2: 26 mmol/L (ref 20–32)
Calcium: 9.9 mg/dL (ref 8.6–10.4)
Chloride: 102 mmol/L (ref 98–110)
Creat: 0.83 mg/dL (ref 0.50–0.99)
GFR, Est African American: 85 mL/min/{1.73_m2} (ref >=60)
GFR, Est Non African American: 73 mL/min/{1.73_m2} (ref >=60)
Globulin: 2.4 g/dL (calc) (ref 1.9–3.7)
Glucose, Bld: 92 mg/dL (ref 65–139)
Potassium: 3.9 mmol/L (ref 3.5–5.3)
Sodium: 138 mmol/L (ref 135–146)
Total Bilirubin: 0.4 mg/dL (ref 0.2–1.2)
Total Protein: 6.7 g/dL (ref 6.1–8.1)

## 2021-01-19 LAB — PROGESTERONE: Progesterone: 33.7 ng/mL

## 2021-01-19 LAB — T4, FREE: Free T4: 1.2 ng/dL (ref 0.8–1.8)

## 2021-01-19 LAB — ESTRADIOL: Estradiol: 17 pg/mL

## 2021-01-19 LAB — T3, FREE: T3, Free: 2.6 pg/mL (ref 2.3–4.2)

## 2021-01-19 LAB — TSH: TSH: 1.01 mIU/L (ref 0.40–4.50)

## 2021-01-21 ENCOUNTER — Other Ambulatory Visit (INDEPENDENT_AMBULATORY_CARE_PROVIDER_SITE_OTHER): Payer: Self-pay | Admitting: Internal Medicine

## 2021-01-25 ENCOUNTER — Other Ambulatory Visit (INDEPENDENT_AMBULATORY_CARE_PROVIDER_SITE_OTHER): Payer: Self-pay | Admitting: Internal Medicine

## 2021-01-25 ENCOUNTER — Telehealth (INDEPENDENT_AMBULATORY_CARE_PROVIDER_SITE_OTHER): Payer: Self-pay

## 2021-01-25 MED ORDER — ESTRADIOL 2 MG PO TABS: 2.0000 mg | ORAL_TABLET | Freq: Every day | ORAL | 1 refills | Status: DC

## 2021-01-25 NOTE — Telephone Encounter (Signed)
Received a refill request from [facility] for a 90 day prescription of the following medication:  estradiol (ESTRACE) 2 MG tablet [[id number]]  Last filled 10/28/2020, #30 with 3 refills and was last sent to [place] 01/18/2021  Next OV 02/10/2021

## 2021-02-01 ENCOUNTER — Ambulatory Visit (INDEPENDENT_AMBULATORY_CARE_PROVIDER_SITE_OTHER): Payer: Medicare Other | Admitting: Nurse Practitioner

## 2021-02-04 ENCOUNTER — Telehealth (INDEPENDENT_AMBULATORY_CARE_PROVIDER_SITE_OTHER): Payer: Self-pay | Admitting: Internal Medicine

## 2021-02-04 MED ORDER — PROGESTERONE 200 MG PO CAPS: 600.0000 mg | ORAL_CAPSULE | Freq: Every evening | ORAL | 1 refills | Status: DC

## 2021-02-04 NOTE — Telephone Encounter (Signed)
Okay,just sent that

## 2021-02-10 ENCOUNTER — Encounter (INDEPENDENT_AMBULATORY_CARE_PROVIDER_SITE_OTHER): Payer: Medicare Other | Admitting: Nurse Practitioner

## 2021-04-07 ENCOUNTER — Other Ambulatory Visit (INDEPENDENT_AMBULATORY_CARE_PROVIDER_SITE_OTHER): Payer: Self-pay | Admitting: Internal Medicine

## 2021-04-21 ENCOUNTER — Other Ambulatory Visit: Payer: Self-pay

## 2021-04-21 ENCOUNTER — Ambulatory Visit (INDEPENDENT_AMBULATORY_CARE_PROVIDER_SITE_OTHER): Payer: Medicare Other | Admitting: Internal Medicine

## 2021-04-21 ENCOUNTER — Encounter (INDEPENDENT_AMBULATORY_CARE_PROVIDER_SITE_OTHER): Payer: Self-pay | Admitting: Internal Medicine

## 2021-04-21 VITALS — BP 140/71 | HR 95 | Temp 97.9°F | Resp 18 | Ht 64.0 in | Wt 173.0 lb

## 2021-04-21 DIAGNOSIS — N951 Menopausal and female climacteric states: Secondary | ICD-10-CM

## 2021-04-21 DIAGNOSIS — R202 Paresthesia of skin: Secondary | ICD-10-CM

## 2021-04-21 DIAGNOSIS — R5381 Other malaise: Secondary | ICD-10-CM | POA: Diagnosis not present

## 2021-04-21 DIAGNOSIS — R5383 Other fatigue: Secondary | ICD-10-CM

## 2021-04-21 DIAGNOSIS — I10 Essential (primary) hypertension: Secondary | ICD-10-CM | POA: Diagnosis not present

## 2021-04-21 NOTE — Progress Notes (Signed)
Metrics: Intervention Frequency ACO  Documented Smoking Status Yearly  Screened one or more times in 24 months  Cessation Counseling or  Active cessation medication Past 24 months  Past 24 months   Guideline developer: UpToDate (See UpToDate for funding source) Date Released: 2014       Wellness Office Visit  Subjective:  Patient ID: Michele Duncan, female    DOB: [birth year]  Age: 70 y.o. MRN: [id number]  CC: This lady comes in for follow-up of bioidentical hormone therapy with menopausal symptoms, hypertension, malaise and fatigue. HPI  She is taking progesterone 400 mg at night but is no longer taking estradiol or NP thyroid.  She is willing to start those again because the burning sensation that she described to me has not improved. She has a burning pain/sensation which emanates in medial aspects of both her thighs and seems to go internally into the pelvic area and up her lower abdomen.  It has been going on for several months now.  Discontinuing NP thyroid did not help. She has no longer postmenopausal bleeding since she stopped taking estradiol. Past Medical History:  Diagnosis Date  . Anxiety   . Arthritis    low back  . Chronic anal fissure   . Chronic low back pain   . Essential hypertension, benign 10/17/2019  . GERD (gastroesophageal reflux disease) 10/17/2019  . Grade II internal hemorrhoids   . Hemorrhoids, external   . History of adenomatous polyp of colon    11-06-2017  . HLD (hyperlipidemia) 10/17/2019  . Hot flashes due to menopause 10/17/2019  . HTN (hypertension)   . Vitamin D deficiency disease 10/17/2019  . Wears glasses    Past Surgical History:  Procedure Laterality Date  . BREAST BIOPSY Left 1970s   benign   . COLONOSCOPY N/A 11/06/2017   Procedure: COLONOSCOPY;  Surgeon: Danie Binder, MD;  [facility];  Service: Endoscopy;  Laterality: N/A;  1:45pm  . HEMORRHOID SURGERY N/A 02/28/2018   Procedure: HEMORRHOIDECTOMY;  Surgeon: Michael Boston, MD;  Location: [facility];  Service: General;  Laterality: N/A;  . SPHINCTEROTOMY N/A 02/28/2018   Procedure: LATERAL SPHINCTEROTOMY;  Surgeon: Michael Boston, MD;  Location: [place];  Service: General;  Laterality: N/A;     Family History  Problem Relation Age of Onset  . Colon cancer Paternal Grandmother   . Breast cancer Mother   . Hypertension Father   . Heart disease Father   . Autoimmune disease Sister   . Lung disease Sister   . Breast cancer Maternal Aunt   . Breast cancer Maternal Grandmother   . 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   Social History   Tobacco Use  . Smoking status: Former Smoker    Types: Cigarettes    Quit date: 12/05/2013    Years since quitting: 7.3  . Smokeless tobacco: Never Used  Substance Use Topics  . Alcohol use: No    Current Meds  Medication Sig  . acetaminophen (TYLENOL) 650 MG CR tablet Take 650 mg by mouth every 8 (eight) hours as needed for pain.  . Cholecalciferol (VITAMIN D-3 PO) Take 7,000 Int'l Units by mouth daily.  . hydrochlorothiazide (MICROZIDE) 12.5 MG capsule TAKE 1 CAPSULE DAILY  . losartan (COZAAR) 50 MG tablet TAKE 1 TABLET DAILY  . naproxen (NAPROSYN) 500 MG tablet TAKE 1 TABLET  TWICE DAILY  WITH MEALS  . pantoprazole (PROTONIX) 40 MG tablet Take 1 tablet (40 mg total) by mouth 2 (two) times daily.  . potassium chloride SA (KLOR-CON) 20 MEQ tablet Take 2 tablets (40 mEq total) by mouth every morning.  . progesterone (PROMETRIUM) 200 MG capsule Take 3 capsules (600 mg total) by mouth at bedtime. (Patient taking differently: Take 400 mg by mouth at bedtime.)  . [DISCONTINUED] NP THYROID 120 MG tablet Take 1 tablet (120 mg total) by mouth daily before breakfast.     [place] Office Visit from 10/19/2020 in [facility]  PHQ-9 Total Score 0      Objective:    Today's Vitals: BP 140/71 (BP Location: Right Arm, Patient Position: Sitting, Cuff Size: Normal)   Pulse 95   Temp 97.9 F (36.6 C) (Temporal)   Resp 18   Ht 5\' 4"  (1.626 m)   Wt 173 lb (78.5 kg)   SpO2 98%   BMI 29.70 kg/m  Vitals with BMI 04/21/2021 01/18/2021 12/09/2020  Height 5\' 4"  5\' 4"  5\' 3"   Weight 173 lbs 171 lbs 13 oz 174 lbs 6 oz  BMI 29.68 64.68 03.2  Systolic 122 482 500  Diastolic 71 80 78  Pulse 95 72 98     Physical Exam  No new physical findings.  Weight is much the same.  Blood pressure slightly elevated compared to last time.    Assessment   1. Hot flashes due to menopause   2. Essential hypertension, benign   3. Paresthesia   4. Malaise and fatigue       Tests ordered Orders Placed This Encounter  Procedures  . Ambulatory referral to Neurology     Plan: 1. After discussion, she is agreeable to restarting estradiol at a dose of 1 mg daily.  She has estradiol 2 mg tablets and she will take half a tablet every day.  She will continue with progesterone 400 mg at night. 2. She is also agreeable to starting NP thyroid 120 mg daily. 3. She will continue with losartan and hydrochlorothiazide for her hypertension which seems to be reasonably well controlled. 4. I will refer to neurology in case the sensation that she is feeling is of neurological origin, possibly a neuropathy. 5. Follow-up in 2 months when we will do blood work.   No orders of the defined types were placed in this encounter.   [name], MD

## 2021-05-02 ENCOUNTER — Other Ambulatory Visit (INDEPENDENT_AMBULATORY_CARE_PROVIDER_SITE_OTHER): Payer: Self-pay | Admitting: Internal Medicine

## 2021-05-12 ENCOUNTER — Emergency Department (HOSPITAL_COMMUNITY)
Admission: EM | Admit: 2021-05-12 | Discharge: 2021-05-12 | Disposition: A | Discharge status: Home / Self Care | Payer: Medicare Other | Attending: Emergency Medicine | Admitting: Emergency Medicine

## 2021-05-12 ENCOUNTER — Encounter (HOSPITAL_COMMUNITY): Payer: Self-pay

## 2021-05-12 ENCOUNTER — Other Ambulatory Visit: Payer: Self-pay

## 2021-05-12 DIAGNOSIS — I10 Essential (primary) hypertension: Secondary | ICD-10-CM | POA: Diagnosis not present

## 2021-05-12 DIAGNOSIS — R350 Frequency of micturition: Secondary | ICD-10-CM | POA: Diagnosis not present

## 2021-05-12 DIAGNOSIS — R531 Weakness: Secondary | ICD-10-CM | POA: Diagnosis not present

## 2021-05-12 DIAGNOSIS — W19XXXA Unspecified fall, initial encounter: Secondary | ICD-10-CM | POA: Diagnosis not present

## 2021-05-12 DIAGNOSIS — R5381 Other malaise: Secondary | ICD-10-CM

## 2021-05-12 DIAGNOSIS — Z87891 Personal history of nicotine dependence: Secondary | ICD-10-CM | POA: Diagnosis not present

## 2021-05-12 DIAGNOSIS — R42 Dizziness and giddiness: Secondary | ICD-10-CM | POA: Diagnosis not present

## 2021-05-12 LAB — CBC
HCT: 37.1 % (ref 36.0–46.0)
Hemoglobin: 12.2 g/dL (ref 12.0–15.0)
MCH: 29.3 pg (ref 26.0–34.0)
MCHC: 32.9 g/dL (ref 30.0–36.0)
MCV: 89 fL (ref 80.0–100.0)
Platelets: 408 10*3/uL — ABNORMAL HIGH (ref 150–400)
RBC: 4.17 MIL/uL (ref 3.87–5.11)
RDW: 12.3 % (ref 11.5–15.5)
WBC: 8.7 10*3/uL (ref 4.0–10.5)
nRBC: 0 % (ref 0.0–0.2)

## 2021-05-12 LAB — BASIC METABOLIC PANEL
Anion gap: 8 (ref 5–15)
BUN: 12 mg/dL (ref 8–23)
CO2: 26 mmol/L (ref 22–32)
Calcium: 9.2 mg/dL (ref 8.9–10.3)
Chloride: 101 mmol/L (ref 98–111)
Creatinine, Ser: 0.8 mg/dL (ref 0.44–1.00)
GFR, Estimated: >60 mL/min (ref >60)
Glucose, Bld: 126 mg/dL — ABNORMAL HIGH (ref 70–99)
Potassium: 3.3 mmol/L — ABNORMAL LOW (ref 3.5–5.1)
Sodium: 135 mmol/L (ref 135–145)

## 2021-05-12 LAB — URINALYSIS, ROUTINE W REFLEX MICROSCOPIC
Bilirubin Urine: NEGATIVE
Glucose, UA: NEGATIVE mg/dL
Hgb urine dipstick: NEGATIVE
Ketones, ur: NEGATIVE mg/dL
Leukocytes,Ua: NEGATIVE
Nitrite: NEGATIVE
Protein, ur: NEGATIVE mg/dL
Specific Gravity, Urine: 1.004 — ABNORMAL LOW (ref 1.005–1.030)
pH: 6 (ref 5.0–8.0)

## 2021-05-12 MED ORDER — POTASSIUM CHLORIDE CRYS ER 20 MEQ PO TBCR
40.0000 meq | EXTENDED_RELEASE_TABLET | Freq: Once | ORAL | Status: AC
  Administered 2021-05-12: 40 meq via ORAL
  Filled 2021-05-12: qty 2

## 2021-05-12 NOTE — ED Notes (Signed)
Pt ambulated to bathroom without difficulty. Gait steady. No c/o SOB, chest pain or dizziness. Will continue to monitor.

## 2021-05-12 NOTE — ED Triage Notes (Signed)
Pt fell tonight. Denies injury. Takes pain pills every 4-6 hours from having teeth pulled on Thursday. States she feels drowsy.

## 2021-05-12 NOTE — ED Provider Notes (Signed)
[facility] Emergency Department Provider Note MRN:  [id number]  Arrival date & time: 05/12/21     Chief Complaint   Fall   History of Present Illness   Michele Duncan is a 70 y.o. year-old female with a history of hypertension, hyperlipidemia presenting to the ED with chief complaint of fall.  Patient has felt generally unwell today, has also noted that she is urinating frequently today.  Denies any burning with urination.  She had oral surgery last week and has been taking hydrocodone tablets every 4 hours as prescribed.  She lost her balance and fell this evening but denies any injuries or pain.  No head trauma.  Denies any chest pain or shortness of breath, no abdominal pain.  Some loose stools recently.  Review of Systems  A complete 10 system review of systems was obtained and all systems are negative except as noted in the HPI and PMH.   Patient's Health History    Past Medical History:  Diagnosis Date  . Anxiety   . Arthritis    low back  . Chronic anal fissure   . Chronic low back pain   . Essential hypertension, benign 10/17/2019  . GERD (gastroesophageal reflux disease) 10/17/2019  . Grade II internal hemorrhoids   . Hemorrhoids, external   . History of adenomatous polyp of colon    11-06-2017  . HLD (hyperlipidemia) 10/17/2019  . Hot flashes due to menopause 10/17/2019  . HTN (hypertension)   . Vitamin D deficiency disease 10/17/2019  . Wears glasses     Past Surgical History:  Procedure Laterality Date  . BREAST BIOPSY Left 1970s   benign   . COLONOSCOPY N/A 11/06/2017   Procedure: COLONOSCOPY;  Surgeon: Danie Binder, MD;  [facility];  Service: Endoscopy;  Laterality: N/A;  1:45pm  . HEMORRHOID SURGERY N/A 02/28/2018   Procedure: HEMORRHOIDECTOMY;  Surgeon: Jenisse Vullo Boston, MD;  [facility];  Service: General;  Laterality: N/A;  . SPHINCTEROTOMY N/A 02/28/2018   Procedure: LATERAL SPHINCTEROTOMY;   Surgeon: Shadiyah Wernli Boston, MD;  Location: [facility];  Service: General;  Laterality: N/A;    Family History  Problem Relation Age of Onset  . Colon cancer Paternal Grandmother   . Breast cancer Mother   . Hypertension Father   . Heart disease Father   . Autoimmune disease Sister   . Lung disease Sister   . Breast cancer Maternal Aunt   . Breast cancer Maternal Grandmother   . Colon polyps Neg Hx     Social History   Socioeconomic History  . Marital status: Married    Spouse name: Not on file  . Number of children: Not on file  . Years of education: Not on file  . Highest education level: Not on file  Occupational History  . Not on file  Tobacco Use  . Smoking status: Former Smoker    Types: Cigarettes    Quit date: 12/05/2013    Years since quitting: 7.4  . Smokeless tobacco: Never Used  Vaping Use  . Vaping Use: Never used  Substance and Sexual Activity  . Alcohol use: No  . Drug use: No  . Sexual activity: Yes  Other Topics Concern  . 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   Social Determinants of Health   Financial Resource Strain: Not on file  Food Insecurity: Not on file  Transportation  Needs: Not on file  Physical Activity: Not on file  Stress: Not on file  Social Connections: Not on file  Intimate Partner Violence: Not on file     Physical Exam   Vitals:   05/12/21 0200 05/12/21 0300  BP: (!) 124/58 128/69  Pulse: 89 75  Resp: 14 19  Temp:    SpO2: 100% 100%    CONSTITUTIONAL: Well-appearing, NAD NEURO:  Alert and oriented x 3, no focal deficits EYES:  eyes equal and reactive ENT/NECK:  no LAD, no JVD CARDIO: Regular rate, well-perfused, normal S1 and S2 PULM:  CTAB no wheezing or rhonchi GI/GU:  normal bowel sounds, non-distended, non-tender MSK/SPINE:  No gross deformities, no edema SKIN:  no rash, atraumatic PSYCH:  Appropriate speech and  behavior  *Additional and/or pertinent findings included in MDM below  Diagnostic and Interventional Summary    EKG Interpretation  Date/Time:  Wednesday May 12 2021 01:22:38 EDT Ventricular Rate:  77 PR Interval:  135 QRS Duration: 82 QT Interval:  385 QTC Calculation: 436 R Axis:   56 Text Interpretation: Sinus rhythm Confirmed by Gerlene Fee [phone]) on 05/12/2021 1:41:23 AM      Labs Reviewed  CBC - Abnormal; Notable for the following components:      Result Value   Platelets 408 (*)    All other components within normal limits  BASIC METABOLIC PANEL - Abnormal; Notable for the following components:   Potassium 3.3 (*)    Glucose, Bld 126 (*)    All other components within normal limits  URINALYSIS, ROUTINE W REFLEX MICROSCOPIC - Abnormal; Notable for the following components:   Color, Urine STRAW (*)    Specific Gravity, Urine 1.004 (*)    All other components within normal limits    No orders to display    Medications  potassium chloride SA (KLOR-CON) CR tablet 40 mEq (40 mEq Oral Given 05/12/21 0158)     Procedures  /  Critical Care Procedures  ED Course and Medical Decision Making  I have reviewed the triage vital signs, the nursing notes, and pertinent available records from the EMR.  Listed above are laboratory and imaging tests that I personally ordered, reviewed, and interpreted and then considered in my medical decision making (see below for details).  Suspect side effects of hydrocodone, also considering metabolic disarray, UTI.  Hemodynamically stable, well-appearing, nonfocal neurological exam, work-up is pending.     Work-up is reassuring, patient seems clinically improved, less affected by home opioid medication.  Advised that she start cutting back on this medicine.  Appropriate for discharge  Barth Kirks. Sedonia Small, MD [place] [email or web address]  Final Clinical Impressions(s) / ED Diagnoses      ICD-10-CM   1. Malaise  R53.81   2. Fall, initial encounter  W19.[facility]     ED Discharge Orders    None       Discharge Instructions Discussed with and Provided to Patient:     Discharge Instructions     You were evaluated in the Emergency Department and after careful evaluation, we did not find any emergent condition requiring admission or further testing in the hospital.  Your exam/testing today was overall reassuring.  Please return to the Emergency Department if you experience any worsening of your condition.  Thank you for allowing [place] to be a part of your care.        Maudie Flakes, MD 05/12/21 [phone]

## 2021-05-12 NOTE — Discharge Instructions (Addendum)
You were evaluated in the Emergency Department and after careful evaluation, we did not find any emergent condition requiring admission or further testing in the hospital.  Your exam/testing today was overall reassuring.  Please return to the Emergency Department if you experience any worsening of your condition.  Thank you for allowing us to be a part of your care.  

## 2021-05-12 NOTE — ED Notes (Signed)
Pt ambulated to bathroom without difficulty. Gait steady. No c/o SOB, chest pain or dizziness.

## 2021-05-26 ENCOUNTER — Other Ambulatory Visit (INDEPENDENT_AMBULATORY_CARE_PROVIDER_SITE_OTHER): Payer: Self-pay | Admitting: Internal Medicine

## 2021-06-23 ENCOUNTER — Ambulatory Visit (INDEPENDENT_AMBULATORY_CARE_PROVIDER_SITE_OTHER): Payer: Medicare Other | Admitting: Internal Medicine

## 2021-06-30 ENCOUNTER — Other Ambulatory Visit (INDEPENDENT_AMBULATORY_CARE_PROVIDER_SITE_OTHER): Payer: Self-pay | Admitting: Internal Medicine

## 2021-07-13 ENCOUNTER — Encounter: Payer: Self-pay | Admitting: Neurology

## 2021-07-13 ENCOUNTER — Telehealth: Payer: Self-pay | Admitting: Neurology

## 2021-07-13 ENCOUNTER — Ambulatory Visit (INDEPENDENT_AMBULATORY_CARE_PROVIDER_SITE_OTHER): Payer: Medicare Other | Admitting: Neurology

## 2021-07-13 VITALS — BP 152/87 | HR 116 | Ht 64.0 in | Wt 171.0 lb

## 2021-07-13 DIAGNOSIS — R1030 Lower abdominal pain, unspecified: Secondary | ICD-10-CM | POA: Insufficient documentation

## 2021-07-13 DIAGNOSIS — R208 Other disturbances of skin sensation: Secondary | ICD-10-CM | POA: Diagnosis not present

## 2021-07-13 DIAGNOSIS — M48062 Spinal stenosis, lumbar region with neurogenic claudication: Secondary | ICD-10-CM

## 2021-07-13 HISTORY — DX: Lower abdominal pain, unspecified: R10.30

## 2021-07-13 MED ORDER — GABAPENTIN 300 MG PO CAPS: 300.0000 mg | ORAL_CAPSULE | Freq: Three times a day (TID) | ORAL | 11 refills | Status: DC

## 2021-07-13 NOTE — Telephone Encounter (Signed)
Medicare/[place]ual of [place] order sent to GI. No auth they will reach out to the patient to schedule.  

## 2021-07-13 NOTE — Progress Notes (Signed)
[facility]  PATIENT: Michele Duncan DOB: 07-07-1951  REFERRING DOCTOR OR PCP: Hurshel Party MD SOURCE: Patient, notes from primary care, imaging and lab reports, MRI images personally reviewed.  _________________________________   HISTORICAL  CHIEF COMPLAINT:  Chief Complaint  Patient presents with   New Patient (Initial Visit)    Rm 1, alone. Internal referral for paresthesia, pt c/o of burning in privates, down to legs and upper arms and under breast. Off/on since the first of the year. Pt states when she walks around and stands, it doesn't "burn".   Past 6 months pt has numbness in both feet, off/on.     HISTORY OF PRESENT ILLNESS:  I had the pleasure of seeing your patient, Michele Duncan, at [facility] for neurologic consultation regarding dysesthesias.  She is a 70 year old woman who had the onset of dysesthesias in the vagina and elsewhere.  She has had burning in the groin region x 4 years, possibly longer.  At first vaginal dryness was felt to be the cause.    She was prescribed estrogen and other creams without benefit.   The burning is daily in the groin but intensifies at times.   Prolonged sitting intensifies the pain the most .  It is minimal with standing and often absent with laying down.   When more intense it may spread to  legs, upper arms and under the breasts.    If pain is bad, pain will greatly improve a couple minutes after standing up.     She also has numbness in her feet.   She has a nephew with a hereditary polyneuropathy - though her sister is fine.    Although standing up helps the groin pain, if she walks a while her back hurts and also works woth mopping and other chores.     She has never been on gabapentin, other antiepileptic agents or TCAs, .   NSAIDs have not helped  MR lumbar spine 09/23/2018 showed DJD at L4L5.   I reviewed and it shows moderate spinal stenosis with disc protrusion, facet hypertrophy.   This might afffect L5but should not cause groin pain     REVIEW OF SYSTEMS: Constitutional: No fevers, chills, sweats, or change in appetite Eyes: No visual changes, double vision, eye pain Ear, nose and throat: No hearing loss, ear pain, nasal congestion, sore throat Cardiovascular: No chest pain, palpitations Respiratory:  No shortness of breath at rest or with exertion.   No wheezes GastrointestinaI: No nausea, vomiting, diarrhea, abdominal pain, fecal incontinence Genitourinary:  No dysuria, urinary retention or frequency.  No nocturia. Musculoskeletal:  No neck pain, back pain Integumentary: No rash, pruritus, skin lesions Neurological: as above Psychiatric: No depression at this time.  No anxiety Endocrine: No palpitations, diaphoresis, change in appetite, change in weigh or increased thirst Hematologic/Lymphatic:  No anemia, purpura, petechiae. Allergic/Immunologic: No itchy/runny eyes, nasal congestion, recent allergic reactions, rashes  ALLERGIES: Allergies  Allergen Reactions   Penicillins Shortness Of Breath and Itching    Has patient had a PCN reaction causing immediate rash, facial/tongue/throat swelling, SOB or lightheadedness with hypotension: Yes Has patient had a PCN reaction causing severe rash involving mucus membranes or skin necrosis: No Has patient had a PCN reaction that required hospitalization: No Has patient had a PCN reaction occurring within the last 10 years: No If all of the above answers are "NO", then may proceed with Cephalosporin use.    Codeine Itching    HOME MEDICATIONS:  Current  Outpatient Medications:    acetaminophen (TYLENOL) 650 MG CR tablet, Take 650 mg by mouth every 8 (eight) hours as needed for pain., Disp: , Rfl:    Cholecalciferol (VITAMIN D-3 PO), Take 7,000 Int'l Units by mouth daily., Disp: , Rfl:    estradiol (ESTRACE) 1 MG tablet, Take 1 mg by mouth daily., Disp: , Rfl:    gabapentin (NEURONTIN) 300 MG capsule, Take 1 capsule  (300 mg total) by mouth 3 (three) times daily., Disp: 90 capsule, Rfl: 11   hydrochlorothiazide (MICROZIDE) 12.5 MG capsule, TAKE 1 CAPSULE DAILY, Disp: 90 capsule, Rfl: 0   KLOR-CON M20 20 MEQ tablet, TAKE 2 TABLETS EVERY       MORNING, Disp: 180 tablet, Rfl: 1   losartan (COZAAR) 50 MG tablet, TAKE 1 TABLET DAILY, Disp: 90 tablet, Rfl: 0   naproxen (NAPROSYN) 500 MG tablet, TAKE 1 TABLET TWICE DAILY  WITH MEALS, Disp: 180 tablet, Rfl: 0   NP THYROID 120 MG tablet, TAKE 1 TABLET(120 MG) BY MOUTH DAILY BEFORE BREAKFAST, Disp: 30 tablet, Rfl: 3   pantoprazole (PROTONIX) 40 MG tablet, Take 1 tablet (40 mg total) by mouth 2 (two) times daily., Disp: 180 tablet, Rfl: 0   progesterone (PROMETRIUM) 200 MG capsule, Take 3 capsules (600 mg total) by mouth at bedtime. (Patient taking differently: Take 400 mg by mouth at bedtime.), Disp: 270 capsule, Rfl: 1  PAST MEDICAL HISTORY: Past Medical History:  Diagnosis Date   Anxiety    Arthritis    low back   Chronic anal fissure    Chronic low back pain    Essential hypertension, benign 10/17/2019   GERD (gastroesophageal reflux disease) 10/17/2019   Grade II internal hemorrhoids    Hemorrhoids, external    History of adenomatous polyp of colon    11-06-2017   HLD (hyperlipidemia) 10/17/2019   Hot flashes due to menopause 10/17/2019   HTN (hypertension)    Vitamin D deficiency disease 10/17/2019   Wears glasses     PAST SURGICAL HISTORY: Past Surgical History:  Procedure Laterality Date   BREAST BIOPSY Left 1970s   benign    COLONOSCOPY N/A 11/06/2017   Procedure: COLONOSCOPY;  Surgeon: Danie Binder, MD;  [facility];  Service: Endoscopy;  Laterality: N/A;  1:45pm   HEMORRHOID SURGERY N/A 02/28/2018   Procedure: HEMORRHOIDECTOMY;  Surgeon: Michael Boston, MD;  Location: [facility];  Service: General;  Laterality: N/A;   repair anal sphincter     SPHINCTEROTOMY N/A 02/28/2018   Procedure: LATERAL SPHINCTEROTOMY;   Surgeon: Michael Boston, MD;  Location: [place];  Service: General;  Laterality: N/A;    FAMILY HISTORY: Family History  Problem Relation Age of Onset   Colon cancer Paternal Grandmother    Breast cancer Mother    Hypertension Father    Heart disease Father    Autoimmune disease Sister    Lung disease Sister    Breast cancer Maternal Aunt    Breast cancer Maternal Grandmother    Colon polyps Neg Hx     SOCIAL HISTORY:  Social History   Socioeconomic History   Marital status: Married    Spouse name: Alroy Dust   Number of children: Not on file   Years of education: 2   Highest education level: High school graduate  Occupational History   Not on file  Tobacco Use   Smoking status: Former    Types: Cigarettes    Quit date: 12/05/2013    Years  since quitting: 7.6   Smokeless tobacco: Never  Vaping Use   Vaping Use: Never used  Substance and Sexual Activity   Alcohol use: No   Drug use: No   Sexual activity: Yes  Other Topics Concern   Not on file  Social History Narrative   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   Lives with husband   Right handed   Caffeine: 3 cups of coffee   Social Determinants of Health   Financial Resource Strain: Not on file  Food Insecurity: Not on file  Transportation Needs: Not on file  Physical Activity: Not on file  Stress: Not on file  Social Connections: Not on file  Intimate Partner Violence: Not on file     PHYSICAL EXAM  Vitals:   07/13/21 1358  BP: (!) 152/87  Pulse: (!) 116  Weight: 171 lb (77.6 kg)  Height: '5\' 4"'$  (1.626 m)    Body mass index is 29.35 kg/m.   General: The patient is well-developed and well-nourished and in no acute distress  HEENT:  Head is [place]/AT.  Sclera are anicteric.    Neck: No carotid bruits are noted.  The neck is nontender.  Cardiovascular: The heart has a regular rate and rhythm with a normal S1 and S2. There were no murmurs, gallops or  rubs.    Skin: Extremities are without rash or  edema.  Musculoskeletal:  Back is nontender  Neurologic Exam  Mental status: The patient is alert and oriented x 3 at the time of the examination. The patient has apparent normal recent and remote memory, with an apparently normal attention span and concentration ability.   Speech is normal.  Cranial nerves: Extraocular movements are full.Facial symmetry is present. There is good facial sensation to soft touch bilaterally.Facial strength is normal.  Trapezius and sternocleidomastoid strength is normal. No dysarthria is noted.  The tongue is midline, and the patient has symmetric elevation of the soft palate. No obvious hearing deficits are noted.  Motor:  Muscle bulk is normal.   Tone is normal. Strength is  5 / 5 in all 4 extremities.   Sensory: Sensory testing is intact to pinprick, soft touch and vibration sensation in all 4 extremities.  She had intact sensation to pinprick in the groin/perineal region.  Coordination: Cerebellar testing reveals good finger-nose-finger and heel-to-shin bilaterally.  Gait and station: Station is normal.   Gait is normal. Tandem gait is normal. Romberg is negative.   Reflexes: Deep tendon reflexes are symmetric and normal in arms and knees (2+).  Ankles were 1+..   Plantar responses are flexor.    DIAGNOSTIC DATA (LABS, IMAGING, TESTING) - I reviewed patient records, labs, notes, testing and imaging myself where available.  Lab Results  Component Value Date   WBC 8.7 05/12/2021   HGB 12.2 05/12/2021   HCT 37.1 05/12/2021   MCV 89.0 05/12/2021   PLT 408 (H) 05/12/2021      Component Value Date/Time   NA 135 05/12/2021 0053   K 3.3 (L) 05/12/2021 0053   CL 101 05/12/2021 0053   CO2 26 05/12/2021 0053   GLUCOSE 126 (H) 05/12/2021 0053   BUN 12 05/12/2021 0053   CREATININE 0.80 05/12/2021 0053   CREATININE 0.83 01/18/2021 1311   CALCIUM 9.2 05/12/2021 0053   PROT 6.7 01/18/2021 1311   ALBUMIN  4.9 03/24/2018 1500   AST 14 01/18/2021 1311   ALT 15 01/18/2021 1311   ALKPHOS 56 03/24/2018 1500  BILITOT 0.4 01/18/2021 1311   GFRNONAA >60 05/12/2021 0053   GFRNONAA 73 01/18/2021 1311   GFRAA 85 01/18/2021 1311    Lab Results  Component Value Date   TSH 1.01 01/18/2021       ASSESSMENT AND PLAN  Dysesthesia - Plan: MR THORACIC SPINE W WO CONTRAST  Inguinal pain, unspecified laterality - Plan: MR THORACIC SPINE W WO CONTRAST  Spinal stenosis of lumbar region with neurogenic claudication   In summary, Ms. Huso is a 70 year old woman with pain in the vaginal region for the past several years that has intensified this year.  When more intense she also gets sensations in a larger area.  Although she does have significant spinal stenosis at L4-L5.  This would not be expected to cause the groin dysesthesias.  It is, however, likely the source of her back pain when she walks longer distances that will enter the legs.  We will check an MRI of the thoracic spine to determine if she has any myelopathic signal in the conus medullaris that may be causing her symptoms (transverse myelitis or compressive myelopathy).  If she does have transverse myelitis we would need to evaluate for possible autoimmune and other disorders.  She will take gabapentin and work up to 300 mg po tid    If not better after 5-6 weeks, she will let us know and I will try lamotrigine or a tricyclic.     She will return to see me in 4 months sooner if ew or worsening symptosms   Chanette Demo A. Felecia Shelling, MD, [facility] [id number], [id number] PM Certified in Neurology, Clinical Neurophysiology, Sleep Medicine and Neuroimaging  [facility] [address], [place], [place] [address] [phone]

## 2021-07-13 NOTE — Patient Instructions (Signed)
For 3 days take gabapentin at bedtime only. For the next 3 days take gabapentin at dinnertime and bedtime. Then take gabapentin in the morning, dinnertime and bedtime  If you are no better after 5 to 6 weeks give [place] a call

## 2021-07-24 ENCOUNTER — Ambulatory Visit
Admission: RE | Admit: 2021-07-24 | Discharge: 2021-07-24 | Disposition: A | Discharge status: Home / Self Care | Payer: Medicare Other | Source: Ambulatory Visit | Attending: Neurology | Admitting: Neurology

## 2021-07-24 ENCOUNTER — Other Ambulatory Visit: Payer: Self-pay

## 2021-07-24 DIAGNOSIS — R208 Other disturbances of skin sensation: Secondary | ICD-10-CM

## 2021-07-24 DIAGNOSIS — R1030 Lower abdominal pain, unspecified: Secondary | ICD-10-CM

## 2021-07-28 ENCOUNTER — Telehealth: Payer: Self-pay | Admitting: Neurology

## 2021-07-28 NOTE — Telephone Encounter (Signed)
Pt asking for something to be called in to calm her for her upcoming MRI. Pt would like a call to discuss what will be offered to her before it is called into [facility]

## 2021-07-28 NOTE — Telephone Encounter (Signed)
Called and spoke with pt. Explained we normally rx xanax 0.'5mg'$ , take 1-2 tab po 30 min prior to MRI and can take additional tab at time of test if needed. Must have driver to and from test. She verbalized understanding. She already has this on hand to take prn for anxiety. She will follow recommended directions to take prior to MRI. Nothing further needed.

## 2021-08-03 ENCOUNTER — Ambulatory Visit (INDEPENDENT_AMBULATORY_CARE_PROVIDER_SITE_OTHER): Payer: Medicare Other | Admitting: Internal Medicine

## 2021-08-05 ENCOUNTER — Ambulatory Visit
Admission: RE | Admit: 2021-08-05 | Discharge: 2021-08-05 | Disposition: A | Discharge status: Home / Self Care | Payer: Medicare Other | Source: Ambulatory Visit | Attending: Neurology | Admitting: Neurology

## 2021-08-05 ENCOUNTER — Other Ambulatory Visit: Payer: Self-pay

## 2021-08-05 DIAGNOSIS — R200 Anesthesia of skin: Secondary | ICD-10-CM | POA: Diagnosis not present

## 2021-08-05 DIAGNOSIS — R208 Other disturbances of skin sensation: Secondary | ICD-10-CM | POA: Diagnosis not present

## 2021-08-05 MED ORDER — GADOBENATE DIMEGLUMINE 529 MG/ML IV SOLN: 15.0000 mL | Freq: Once | INTRAVENOUS | Status: DC | PRN

## 2021-08-05 MED ORDER — GADOBENATE DIMEGLUMINE 529 MG/ML IV SOLN
15.0000 mL | Freq: Once | INTRAVENOUS | Status: AC | PRN
  Administered 2021-08-05: 15 mL via INTRAVENOUS

## 2021-08-11 ENCOUNTER — Telehealth: Payer: Self-pay | Admitting: Neurology

## 2021-08-11 NOTE — Telephone Encounter (Signed)
Called and LVM letting pt know Dr. Felecia Shelling sent her a mychart message: "The MRI shows that the spinal cod appeared normal   You have some disc degenerative changes but nothing that should cause groin pain." Advised she should keep next scheduled follow up on 11/25/21 at 11:30am and to call if she develops any new or worsening sx before then.

## 2021-08-11 NOTE — Telephone Encounter (Signed)
Pt states she see's the results to the MRI, she would like a call with an explanation of the results.

## 2021-09-01 ENCOUNTER — Other Ambulatory Visit: Payer: Self-pay | Admitting: *Deleted

## 2021-09-01 MED ORDER — GABAPENTIN 300 MG PO CAPS
300.0000 mg | ORAL_CAPSULE | Freq: Three times a day (TID) | ORAL | 3 refills | Status: DC
Start: 2021-09-01 — End: 2021-12-02

## 2021-09-07 DIAGNOSIS — Z23 Encounter for immunization: Secondary | ICD-10-CM | POA: Diagnosis not present

## 2021-09-08 DIAGNOSIS — Z6830 Body mass index (BMI) 30.0-30.9, adult: Secondary | ICD-10-CM | POA: Diagnosis not present

## 2021-09-08 DIAGNOSIS — Z124 Encounter for screening for malignant neoplasm of cervix: Secondary | ICD-10-CM | POA: Diagnosis not present

## 2021-09-19 ENCOUNTER — Other Ambulatory Visit (INDEPENDENT_AMBULATORY_CARE_PROVIDER_SITE_OTHER): Payer: Self-pay | Admitting: Pediatrics

## 2021-10-15 ENCOUNTER — Other Ambulatory Visit: Payer: Self-pay

## 2021-10-15 ENCOUNTER — Ambulatory Visit (INDEPENDENT_AMBULATORY_CARE_PROVIDER_SITE_OTHER): Payer: Medicare Other | Admitting: Family

## 2021-10-15 ENCOUNTER — Encounter: Payer: Self-pay | Admitting: Family

## 2021-10-15 VITALS — BP 152/82 | HR 79 | Temp 98.0°F | Ht 64.0 in | Wt 179.0 lb

## 2021-10-15 DIAGNOSIS — Z7689 Persons encountering health services in other specified circumstances: Secondary | ICD-10-CM

## 2021-10-15 DIAGNOSIS — K219 Gastro-esophageal reflux disease without esophagitis: Secondary | ICD-10-CM

## 2021-10-15 DIAGNOSIS — G609 Hereditary and idiopathic neuropathy, unspecified: Secondary | ICD-10-CM | POA: Diagnosis not present

## 2021-10-15 DIAGNOSIS — I10 Essential (primary) hypertension: Secondary | ICD-10-CM | POA: Diagnosis not present

## 2021-10-15 DIAGNOSIS — E039 Hypothyroidism, unspecified: Secondary | ICD-10-CM

## 2021-10-15 DIAGNOSIS — Z79890 Hormone replacement therapy (postmenopausal): Secondary | ICD-10-CM | POA: Diagnosis not present

## 2021-10-15 LAB — COMPREHENSIVE METABOLIC PANEL
ALT: 9 U/L (ref 0–35)
AST: 12 U/L (ref 0–37)
Albumin: 4.3 g/dL (ref 3.5–5.2)
Alkaline Phosphatase: 57 U/L (ref 39–117)
BUN: 13 mg/dL (ref 6–23)
CO2: 27 mEq/L (ref 19–32)
Calcium: 9.4 mg/dL (ref 8.4–10.5)
Chloride: 100 mEq/L (ref 96–112)
Creatinine, Ser: 0.87 mg/dL (ref 0.40–1.20)
GFR: 68.48 mL/min (ref >60.00)
Glucose, Bld: 89 mg/dL (ref 70–99)
Potassium: 3.7 mEq/L (ref 3.5–5.1)
Sodium: 136 mEq/L (ref 135–145)
Total Bilirubin: 0.4 mg/dL (ref 0.2–1.2)
Total Protein: 6.7 g/dL (ref 6.0–8.3)

## 2021-10-15 LAB — TSH: TSH: 0.55 u[IU]/mL (ref 0.35–5.50)

## 2021-10-15 LAB — T4, FREE: Free T4: 0.54 ng/dL — ABNORMAL LOW (ref 0.60–1.60)

## 2021-10-15 MED ORDER — THYROID 120 MG PO TABS: ORAL_TABLET | ORAL | 3 refills | Status: DC

## 2021-10-15 NOTE — Progress Notes (Signed)
New Patient Office Visit  Subjective:  Patient ID: Michele [facility], female    DOB: [birth year]  Age: 70 y.o. MRN: [id number]  CC:  Chief Complaint  Patient presents with   Establish Care   Hypertension   Gastroesophageal Reflux   Vitamin D Deficiency   Nerve pain    Is currently taking Gabapentin. She was told by OBGYN to possibly increase if she continues to have pain. She complains of generalized nerve pain, more when sitting due to back pain. She has increased Gabapentin to 600 mg in the AM, 600 mg at lunch, and 300 mg at night. She would like to discuss another alternative.    hormone replacement therapy    HPI Michele Duncan presents to establish care and to discuss a few chronic problems. Hypertension: Patient is currently maintained on the following medications for blood pressure: Losartan, HCTZ Patient reports good compliance with blood pressure medications. Patient denies chest pain, shortness of breath or swelling. Last 3 blood pressure readings in our office are as follows: BP Readings from Last 3 Encounters:  10/15/21 (!) 152/82  07/13/21 (!) 152/87  05/12/21 128/69  GERD, Follow up: The patient was last seen for GERD 1 years ago. Changes made since that visit include none. She reports good compliance with treatment. She is not having side effects. . She IS experiencing belching, fullness after meals, and heartburn. She is NOT experiencing chest pain, cough, difficulty swallowing, or hoarseness Hormone Replacement Therapy: Patient presents to discuss hormone replacement therapy. Patient is requesting hormone replacement therapy due to hot flashes, moodiness, no energy, personal history of heart disease, and vaginal dryness. The patient is taking hormone replacement therapy.  Patient denies post-menopausal vaginal bleeding. last pap: was normal. Patient is hormone deficient due to menopause, which occurred at age.  Current hormone therapy: Estrogen: 71m qd and Progesterone:  4075mqd.   Neuropathy: She describes symptoms of numbness, burning, and tingling. Onset of symptoms was gradual, starting about several years ago. Symptoms are currently of moderate severity. Symptoms occur intermittently and last day. The patient denies lancinating pain, squeezing, hypersensitivity, and allodynia. Symptoms are symmetric.   Previous treatment has included Gabapentin, which has not improved symptoms.  .  Past Medical History:  Diagnosis Date   Anxiety    Arthritis    low back   Chronic anal fissure    Chronic low back pain    Essential hypertension, benign 10/17/2019   GERD (gastroesophageal reflux disease) 10/17/2019   Grade II internal hemorrhoids    Hemorrhoids, external    History of adenomatous polyp of colon    11-06-2017   HLD (hyperlipidemia) 10/17/2019   Hot flashes due to menopause 10/17/2019   Vitamin D deficiency disease 10/17/2019   Wears glasses     Past Surgical History:  Procedure Laterality Date   BREAST BIOPSY Left 1970s   benign    COLONOSCOPY N/A 11/06/2017   Procedure: COLONOSCOPY;  Surgeon: FiDanie BinderMD;  Location: [facility];  Service: Endoscopy;  Laterality: N/A;  1:45pm   HEMORRHOID SURGERY N/A 02/28/2018   Procedure: HEMORRHOIDECTOMY;  Surgeon: GrMichael BostonMD;  [facility];  Laterality: N/A;   repair anal sphincter     SPHINCTEROTOMY N/A 02/28/2018   Procedure: LATERAL SPHINCTEROTOMY;  Surgeon: GrMichael BostonMD;  [facility]: [facility];  Laterality: N/A;    Family History  Problem Relation Age of Onset   Colon cancer Paternal  Grandmother    Breast cancer Mother    Hypertension Father    Heart disease Father    Autoimmune disease Sister    Lung disease Sister    Breast cancer Maternal Aunt    Breast cancer Maternal Grandmother    Colon polyps Neg Hx     Social History   Socioeconomic History   Marital status: Married    Spouse name:  Michele [facility]   Number of children: Not on file   Years of education: 2   Highest education level: High school graduate  Occupational History   Not on file  Tobacco Use   Smoking status: Former    Types: Cigarettes    Quit date: 12/05/2013    Years since quitting: 7.8   Smokeless tobacco: Never  Vaping Use   Vaping Use: Never used  Substance and Sexual Activity   Alcohol use: No   Drug use: No   Sexual activity: Yes  Other Topics Concern   Not on file  Social History Narrative   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   Lives with husband   Right handed   Caffeine: 3 cups of coffee   Social Determinants of Health   Financial Resource Strain: Not on file  Food Insecurity: Not on file  Transportation Needs: Not on file  Physical Activity: Not on file  Stress: Not on file  Social Connections: Not on file  Intimate Partner Violence: Not on file    Objective:   Today's Vitals: BP (!) 152/82   Pulse 79   Temp 98 F (36.7 C) (Temporal)   Ht 5' 4" (1.626 m)   Wt 179 lb (81.2 kg)   SpO2 98%   BMI 30.73 kg/m   Physical Exam Vitals and nursing note reviewed.  Constitutional:      Appearance: Normal appearance.  Cardiovascular:     Rate and Rhythm: Normal rate and regular rhythm.  Pulmonary:     Effort: Pulmonary effort is normal.     Breath sounds: Normal breath sounds.  Musculoskeletal:        General: Normal range of motion.  Skin:    General: Skin is warm and dry.  Neurological:     Mental Status: She is alert.  Psychiatric:        Mood and Affect: Mood normal.        Behavior: Behavior normal.    Assessment & Plan:   Problem List Items Addressed This Visit       Cardiovascular and Mediastinum   Essential hypertension, benign - Primary    Elevated today, pt reports not taking her Losartan or HCTZ this am, will continue to monitor.      Relevant Orders   Comp Met (CMET) (Completed)     Digestive   GERD (gastroesophageal  reflux disease)     Endocrine   Acquired hypothyroidism    Pt has not had medicaton for around a week.       Relevant Medications   thyroid (NP THYROID) 120 MG tablet   Other Relevant Orders   T4, free (Completed)   TSH (Completed)     Nervous and Auditory   Idiopathic peripheral neuropathy    Has an upcoming appt. With [place] for further workup. States she feels tingling and burning pain all over at different times, has pain in lumbar region with extended sitting.        Other   Hormone replacement therapy (HRT)    Does  not remember how long she has been on meds, reports mother had breast cancer. Never had genetic testing, annual mammograms have been wnl, but recommended she have this done as she is at a higher risk d/t age and familial hx.       Relevant Orders   Estradiol   Progesterone    Outpatient Encounter Medications as of 10/15/2021  Medication Sig   acetaminophen (TYLENOL) 650 MG CR tablet Take 650 mg by mouth every 8 (eight) hours as needed for pain.   Cholecalciferol (VITAMIN D-3 PO) Take 7,000 Int'l Units by mouth daily.   estradiol (ESTRACE) 1 MG tablet Take 1 mg by mouth daily.   gabapentin (NEURONTIN) 300 MG capsule Take 1 capsule (300 mg total) by mouth 3 (three) times daily.   hydrochlorothiazide (MICROZIDE) 12.5 MG capsule TAKE 1 CAPSULE DAILY   KLOR-CON M20 20 MEQ tablet TAKE 2 TABLETS EVERY       MORNING   losartan (COZAAR) 50 MG tablet TAKE 1 TABLET DAILY   naproxen (NAPROSYN) 500 MG tablet TAKE 1 TABLET TWICE DAILY  WITH MEALS   pantoprazole (PROTONIX) 40 MG tablet Take 1 tablet (40 mg total) by mouth 2 (two) times daily.   progesterone (PROMETRIUM) 200 MG capsule Take 3 capsules (600 mg total) by mouth at bedtime. (Patient taking differently: Take 400 mg by mouth at bedtime.)   [DISCONTINUED] NP THYROID 120 MG tablet TAKE 1 TABLET(120 MG) BY MOUTH DAILY BEFORE BREAKFAST   thyroid (NP THYROID) 120 MG tablet TAKE 1 TABLET(120 MG) BY MOUTH DAILY  BEFORE BREAKFAST   No facility-administered encounter medications on file as of 10/15/2021.    Follow-up: No follow-ups on file.   Jeanie Sewer, NP

## 2021-10-15 NOTE — Assessment & Plan Note (Addendum)
Does not remember how long she has been on meds, reports mother had breast cancer. Never had genetic testing, annual mammograms have been wnl, but recommended she have this done as she is at a higher risk d/t age and familial hx.

## 2021-10-15 NOTE — Patient Instructions (Addendum)
Welcome to [facility]! It was a pleasure meeting you today.  As discussed, Please go to the lab for blood work today, and schedule a 3 month follow up visit. Your thyroid medicine refill has been sent to your pharmacy.  PLEASE NOTE:  If you had any LAB tests please let us know if you have not heard back within a few days. You may see your results on MyChart before we have a chance to review them but we will give you a call once they are reviewed by [place]. If we ordered any REFERRALS today, please let us know if you have not heard from their office within the next week.  Let us know through MyChart if you are needing REFILLS, or have your pharmacy send [place] the request. You can also use MyChart to communicate with me or any office staff.  Please try these tips to maintain a healthy lifestyle:  Eat most of your calories during the day when you are active. Eliminate processed foods including packaged sweets (pies, cakes, cookies), reduce intake of potatoes, white bread, white pasta, and white rice. Look for whole grain options, oat flour or almond flour.  Each meal should contain half fruits/vegetables, one quarter protein, and one quarter carbs (no bigger than a computer mouse).  Cut down on sweet beverages. This includes juice, soda, and sweet tea. Also watch fruit intake, though this is a healthier sweet option, it still contains natural sugar! Limit to 3 servings daily.  Drink at least 1 glass of water with each meal and aim for at least 8 glasses per day  Exercise at least 150 minutes every week.

## 2021-10-15 NOTE — Assessment & Plan Note (Signed)
Has an upcoming appt. With [place] for further workup. States she feels tingling and burning pain all over at different times, has pain in lumbar region with extended sitting.

## 2021-10-15 NOTE — Assessment & Plan Note (Signed)
Pt has not had medicaton for around a week.

## 2021-10-15 NOTE — Assessment & Plan Note (Signed)
Elevated today, pt reports not taking her Losartan or HCTZ this am, will continue to monitor.

## 2021-10-16 LAB — ESTRADIOL: Estradiol: 70 pg/mL

## 2021-10-16 LAB — PROGESTERONE: Progesterone: 15.3 ng/mL

## 2021-10-18 ENCOUNTER — Telehealth: Payer: Self-pay

## 2021-10-18 NOTE — Telephone Encounter (Signed)
LAST APPOINTMENT DATE:  10/15/21  NEXT APPOINTMENT DATE: 01/17/22  MEDICATION:hydrochlorothiazide (MICROZIDE) 12.5 MG capsule  progesterone (PROMETRIUM) 200 MG capsule (Expired)  losartan (COZAAR) 50 MG tablet  PHARMACY: [facility], [place] [facility]

## 2021-10-19 ENCOUNTER — Other Ambulatory Visit: Payer: Self-pay

## 2021-10-19 MED ORDER — LOSARTAN POTASSIUM 50 MG PO TABS: 50.0000 mg | ORAL_TABLET | Freq: Every day | ORAL | 0 refills | Status: DC

## 2021-10-19 MED ORDER — HYDROCHLOROTHIAZIDE 12.5 MG PO CAPS: 12.5000 mg | ORAL_CAPSULE | Freq: Every day | ORAL | 0 refills | Status: DC

## 2021-10-19 MED ORDER — PROGESTERONE 200 MG PO CAPS: 600.0000 mg | ORAL_CAPSULE | Freq: Every evening | ORAL | 1 refills | Status: DC

## 2021-10-19 NOTE — Telephone Encounter (Signed)
Medications sent to pharmacy

## 2021-10-20 NOTE — Progress Notes (Signed)
Let Esti know her lab results please:  Thryoid level slightly low, expected with having run out of medication, continue same dose daily and will recheck in 3-64mos.  Electrolytes, liver and kidney function, blood sugar all in normal range.  Hormone levels all in normal range, continue current doses, but I would recommend going down on your Progesterone to 300mg  daily. As I discussed during our visit, higher levels of hormone can increase risk of breast cancer the older you get. If agreeable, I will send in refill for 300mg . Let me know what she decides. thanks

## 2021-10-21 ENCOUNTER — Telehealth: Payer: Self-pay

## 2021-10-21 NOTE — Telephone Encounter (Signed)
LAST APPOINTMENT DATE:  10/15/21  NEXT APPOINTMENT DATE:  01/17/21  MEDICATION:naproxen (NAPROSYN) 500 MG tablet   PHARMACY: [facility], [place] [facility] [address]

## 2021-10-22 ENCOUNTER — Other Ambulatory Visit: Payer: Self-pay

## 2021-10-22 MED ORDER — NAPROXEN 500 MG PO TABS: 500.0000 mg | ORAL_TABLET | Freq: Two times a day (BID) | ORAL | 0 refills | Status: DC

## 2021-10-22 NOTE — Telephone Encounter (Signed)
Medication sent.

## 2021-10-25 ENCOUNTER — Other Ambulatory Visit: Payer: Self-pay | Admitting: Family

## 2021-10-25 DIAGNOSIS — Z79890 Hormone replacement therapy (postmenopausal): Secondary | ICD-10-CM

## 2021-10-25 MED ORDER — PROGESTERONE MICRONIZED 100 MG PO CAPS: 300.0000 mg | ORAL_CAPSULE | Freq: Every evening | ORAL | 1 refills | Status: DC

## 2021-11-16 ENCOUNTER — Telehealth: Payer: Self-pay

## 2021-11-16 NOTE — Telephone Encounter (Signed)
Pt called regarding a prescription. She stated that she is taking NP Thyroid medication but she wants to know if [facility] [place] can change it to an alternative that is cheaper. Pt would like a call back to discuss. Please Advise.

## 2021-11-17 NOTE — Telephone Encounter (Signed)
Yes, Levothyroxine would be cheaper, [facility] has it for $10-20 for a 90day supply depending on pharmacy. Her dose would be a little higher, and very important to f/u with labs as discussed before to be sure we are in range. thx

## 2021-11-17 NOTE — Telephone Encounter (Signed)
Spoke with the pt to give message below. She would like it to know which dosage, she'll be on. She is also requesting for it be sent to [facility] for cost effectiveness.

## 2021-11-19 ENCOUNTER — Other Ambulatory Visit: Payer: Self-pay | Admitting: Family

## 2021-11-19 DIAGNOSIS — E039 Hypothyroidism, unspecified: Secondary | ICD-10-CM

## 2021-11-19 MED ORDER — LEVOTHYROXINE SODIUM 175 MCG PO TABS: 175.0000 ug | ORAL_TABLET | Freq: Every day | ORAL | 0 refills | Status: DC

## 2021-11-19 NOTE — Telephone Encounter (Signed)
Patient called to give FYI and clarify that her insurance will only cover Levothyroxin tablets not Levothyroxine capsules. Patient still wants it sent to [facility]. Patient is completely out of medication     Good callback number is [phone]

## 2021-11-19 NOTE — Telephone Encounter (Signed)
Please advise for px for Levothyroxine dose.

## 2021-11-25 ENCOUNTER — Ambulatory Visit: Payer: Medicare Other | Admitting: Neurology

## 2021-12-02 ENCOUNTER — Encounter: Payer: Self-pay | Admitting: Neurology

## 2021-12-02 ENCOUNTER — Ambulatory Visit (INDEPENDENT_AMBULATORY_CARE_PROVIDER_SITE_OTHER): Payer: Medicare Other | Admitting: Neurology

## 2021-12-02 VITALS — BP 142/82 | HR 80 | Ht 64.0 in | Wt 176.5 lb

## 2021-12-02 DIAGNOSIS — R208 Other disturbances of skin sensation: Secondary | ICD-10-CM | POA: Diagnosis not present

## 2021-12-02 DIAGNOSIS — M48062 Spinal stenosis, lumbar region with neurogenic claudication: Secondary | ICD-10-CM | POA: Diagnosis not present

## 2021-12-02 DIAGNOSIS — R1030 Lower abdominal pain, unspecified: Secondary | ICD-10-CM | POA: Diagnosis not present

## 2021-12-02 MED ORDER — GABAPENTIN 600 MG PO TABS: 600.0000 mg | ORAL_TABLET | Freq: Three times a day (TID) | ORAL | 3 refills | Status: DC

## 2021-12-02 NOTE — Progress Notes (Signed)
[facility]  PATIENT: Michele Duncan DOB: 10-23-1951  REFERRING DOCTOR OR PCP: Hurshel Party MD SOURCE: Patient, notes from primary care, imaging and lab reports, MRI images personally reviewed.  _________________________________   HISTORICAL  CHIEF COMPLAINT:  Chief Complaint  Patient presents with   Follow-up    Rm 1, alone. Here for 4 month f/u. Pt reports neuropathy in feet and legs are better. Back pn is still the same. Burning near pubis area continues.     HISTORY OF PRESENT ILLNESS:  Michele Duncan is a 70 y.o. woman with dysesthesias.  UPDATE 12/02/2021 She started gabapentin 300 mg tid and had mild benefit.   The dose has been increased to 600-600-300 and she tolerates that well and is doing noticeably better  Her back pain is also doing better.    She is sleeping well at night.    She has been able to do some household chores with less pain - like vacuuming.     She also has numbness in her feet.     She still has groin pain and we discussed no clear cut explanation --- however, we discussed that medications like gabapentin often help the symptoms.      History of Dysesthesias: She  has had burning in the groin region since 2018 or earlier.  At first vaginal dryness was felt to be the cause.    She was prescribed estrogen and other creams without benefit.   The burning is daily in the groin but intensifies at times.   Prolonged sitting intensifies the pain the most .  It is minimal with standing and often absent with laying down.   When more intense it may spread to  legs, upper arms and under the breasts.    If pain is bad, pain will greatly improve a couple minutes after standing up.     Although standing up helps the groin pain, if she walks a while her back hurts and also works woth mopping and other chores.       IMAGING MR lumbar spine 09/23/2018 showed DJD at L4L5.   I reviewed and it shows moderate spinal stenosis with disc protrusion, facet  hypertrophy.  This might afffect L5but should not cause groin pain     MRI thoracic spine 12/02/2021 showed  that the spinal cord and the conus medullaris appears normal.   In the thoracic spine, mild degenerative changes are noted at T6-T7 to T12-L1 though there is no nerve root compression or spinal stenosis.  Not covered on the axial views, there appears to be a disc protrusion at the edge of the field-of-view with spinal stenosis at C6-C7.    REVIEW OF SYSTEMS: Constitutional: No fevers, chills, sweats, or change in appetite Eyes: No visual changes, double vision, eye pain Ear, nose and throat: No hearing loss, ear pain, nasal congestion, sore throat Cardiovascular: No chest pain, palpitations Respiratory:  No shortness of breath at rest or with exertion.   No wheezes GastrointestinaI: No nausea, vomiting, diarrhea, abdominal pain, fecal incontinence Genitourinary:  No dysuria, urinary retention or frequency.  No nocturia. Musculoskeletal:  No neck pain, back pain Integumentary: No rash, pruritus, skin lesions Neurological: as above Psychiatric: No depression at this time.  No anxiety Endocrine: No palpitations, diaphoresis, change in appetite, change in weigh or increased thirst Hematologic/Lymphatic:  No anemia, purpura, petechiae. Allergic/Immunologic: No itchy/runny eyes, nasal congestion, recent allergic reactions, rashes  ALLERGIES: Allergies  Allergen Reactions   Penicillins Shortness Of Breath and  Itching    Has patient had a PCN reaction causing immediate rash, facial/tongue/throat swelling, SOB or lightheadedness with hypotension: Yes Has patient had a PCN reaction causing severe rash involving mucus membranes or skin necrosis: No Has patient had a PCN reaction that required hospitalization: No Has patient had a PCN reaction occurring within the last 10 years: No If all of the above answers are "NO", then may proceed with Cephalosporin use.    Codeine Itching    HOME  MEDICATIONS:  Current Outpatient Medications:    acetaminophen (TYLENOL) 650 MG CR tablet, Take 650 mg by mouth every 8 (eight) hours as needed for pain., Disp: , Rfl:    Cholecalciferol (VITAMIN D-3 PO), Take 7,000 Int'l Units by mouth daily., Disp: , Rfl:    estradiol (ESTRACE) 1 MG tablet, Take 1 mg by mouth daily., Disp: , Rfl:    gabapentin (NEURONTIN) 600 MG tablet, Take 1 tablet (600 mg total) by mouth 3 (three) times daily., Disp: 270 tablet, Rfl: 3   hydrochlorothiazide (MICROZIDE) 12.5 MG capsule, Take 1 capsule (12.5 mg total) by mouth daily., Disp: 90 capsule, Rfl: 0   KLOR-CON M20 20 MEQ tablet, TAKE 2 TABLETS EVERY       MORNING, Disp: 180 tablet, Rfl: 1   levothyroxine (SYNTHROID) 175 MCG tablet, Take 1 tablet (175 mcg total) by mouth daily., Disp: 90 tablet, Rfl: 0   losartan (COZAAR) 50 MG tablet, Take 1 tablet (50 mg total) by mouth daily., Disp: 90 tablet, Rfl: 0   naproxen (NAPROSYN) 500 MG tablet, Take 1 tablet (500 mg total) by mouth 2 (two) times daily with a meal., Disp: 180 tablet, Rfl: 0   pantoprazole (PROTONIX) 40 MG tablet, Take 1 tablet (40 mg total) by mouth 2 (two) times daily., Disp: 180 tablet, Rfl: 0   progesterone (PROMETRIUM) 100 MG capsule, Take 3 capsules (300 mg total) by mouth at bedtime., Disp: 270 capsule, Rfl: 1  PAST MEDICAL HISTORY: Past Medical History:  Diagnosis Date   Anxiety    Arthritis    low back   Chronic anal fissure    Chronic low back pain    Essential hypertension, benign 10/17/2019   GERD (gastroesophageal reflux disease) 10/17/2019   Grade II internal hemorrhoids    Hemorrhoids, external    History of adenomatous polyp of colon    11-06-2017   HLD (hyperlipidemia) 10/17/2019   Hot flashes due to menopause 10/17/2019   Vitamin D deficiency disease 10/17/2019   Wears glasses     PAST SURGICAL HISTORY: Past Surgical History:  Procedure Laterality Date   BREAST BIOPSY Left 1970s   benign    COLONOSCOPY N/A 11/06/2017    Procedure: COLONOSCOPY;  Surgeon: Danie Binder, MD;  [facility];  Service: Endoscopy;  Laterality: N/A;  1:45pm   HEMORRHOID SURGERY N/A 02/28/2018   Procedure: HEMORRHOIDECTOMY;  Surgeon: Michael Boston, MD;  [facility];  Service: General;  Laterality: N/A;   repair anal sphincter     SPHINCTEROTOMY N/A 02/28/2018   Procedure: LATERAL SPHINCTEROTOMY;  Surgeon: Michael Boston, MD;  Location: [place];  Service: General;  Laterality: N/A;    FAMILY HISTORY: Family History  Problem Relation Age of Onset   Colon cancer Paternal Grandmother    Breast cancer Mother    Hypertension Father    Heart disease Father    Autoimmune disease Sister    Lung disease Sister    Breast cancer Maternal Aunt  Breast cancer Maternal Grandmother    Colon polyps Neg Hx     SOCIAL HISTORY:  Social History   Socioeconomic History   Marital status: Married    Spouse name: Alroy Dust   Number of children: Not on file   Years of education: 2   Highest education level: High school graduate  Occupational History   Not on file  Tobacco Use   Smoking status: Former    Types: Cigarettes    Quit date: 12/05/2013    Years since quitting: 7.9   Smokeless tobacco: Never  Vaping Use   Vaping Use: Never used  Substance and Sexual Activity   Alcohol use: No   Drug use: No   Sexual activity: Yes  Other Topics Concern   Not on file  Social History Narrative   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   Lives with husband   Right handed   Caffeine: 3 cups of coffee   Social Determinants of Health   Financial Resource Strain: Not on file  Food Insecurity: Not on file  Transportation Needs: Not on file  Physical Activity: Not on file  Stress: Not on file  Social Connections: Not on file  Intimate Partner Violence: Not on file     PHYSICAL EXAM  Vitals:   12/02/21 1243  BP: (!) 142/82  Pulse: 80  Weight: 176 lb  8 oz (80.1 kg)  Height: 5\' 4"  (1.626 m)    Body mass index is 30.3 kg/m.   General: The patient is well-developed and well-nourished and in no acute distress  HEENT:  Head is [place]/AT.  Sclera are anicteric.     Skin: Extremities are without rash or  edema.  Musculoskeletal:  Back is nontender  Neurologic Exam  Mental status: The patient is alert and oriented x 3 at the time of the examination. The patient has apparent normal recent and remote memory, with an apparently normal attention span and concentration ability.   Speech is normal.  Cranial nerves: Extraocular movements are full.   Facial strenegh is fine.  No obvious hearing deficits are noted.  Motor:  Muscle bulk is normal.   Tone is normal. Strength is  5 / 5 in all 4 extremities.   Sensory: Sensory testing is intact to pinprick, soft touch and vibration sensation in all 4 extremities.     Coordination: Cerebellar testing reveals good finger-nose-finger and heel-to-shin bilaterally.  Gait and station: Station is normal.   Gait is normal. Tandem gait is normal. Romberg is negative.   Reflexes: Deep tendon reflexes are symmetric and normal in arms and knees (2+).  Ankles were 1+.Marland Kitchen     DIAGNOSTIC DATA (LABS, IMAGING, TESTING) - I reviewed patient records, labs, notes, testing and imaging myself where available.  Lab Results  Component Value Date   WBC 8.7 05/12/2021   HGB 12.2 05/12/2021   HCT 37.1 05/12/2021   MCV 89.0 05/12/2021   PLT 408 (H) 05/12/2021      Component Value Date/Time   NA 136 10/15/2021 1337   K 3.7 10/15/2021 1337   CL 100 10/15/2021 1337   CO2 27 10/15/2021 1337   GLUCOSE 89 10/15/2021 1337   BUN 13 10/15/2021 1337   CREATININE 0.87 10/15/2021 1337   CREATININE 0.83 01/18/2021 1311   CALCIUM 9.4 10/15/2021 1337   PROT 6.7 10/15/2021 1337   ALBUMIN 4.3 10/15/2021 1337   AST 12 10/15/2021 1337   ALT 9 10/15/2021 1337   ALKPHOS 57 10/15/2021  1337   BILITOT 0.4 10/15/2021 1337    GFRNONAA >60 05/12/2021 0053   GFRNONAA 73 01/18/2021 1311   GFRAA 85 01/18/2021 1311    Lab Results  Component Value Date   TSH 0.55 10/15/2021       ASSESSMENT AND PLAN  Dysesthesia  Inguinal pain, unspecified laterality  Spinal stenosis of lumbar region with neurogenic claudication   Increase gabapentin to 600 mg po tid.  Send in 90 day scripts with refills. If pain increases, consider a tricyclic or lamotrigine. Rtc prn.  Hopefully her PCP office will be able to write refills.    Jaamal Farooqui A. Felecia Shelling, MD, [facility] 34/[birth year]1947, 1:25 PM Certified in Neurology, Clinical Neurophysiology, Sleep Medicine and Neuroimaging  [facility] [address], [place], [place] [address] [phone]

## 2021-12-14 ENCOUNTER — Telehealth: Payer: Self-pay

## 2021-12-14 ENCOUNTER — Telehealth: Payer: Self-pay | Admitting: Family

## 2021-12-14 ENCOUNTER — Other Ambulatory Visit: Payer: Self-pay

## 2021-12-14 MED ORDER — PANTOPRAZOLE SODIUM 40 MG PO TBEC: 40.0000 mg | DELAYED_RELEASE_TABLET | Freq: Two times a day (BID) | ORAL | 0 refills | Status: DC

## 2021-12-14 NOTE — Telephone Encounter (Signed)
Last visit: NPV on 10/15/2021 Rx refill denied: Already filled my means of Rheumatology 12/14/2020.

## 2021-12-14 NOTE — Telephone Encounter (Signed)
Correction refill sent to  pharmacy 12/14/2021.

## 2021-12-14 NOTE — Telephone Encounter (Signed)
Pt states her meds (Pantoprazole 40 mg) were called into the wrong pharmacy. She received a call from [place] that they were called in to them. She needs them called into [facility] due to pricing.

## 2021-12-14 NOTE — Telephone Encounter (Signed)
..   Encourage patient to contact the pharmacy for refills or they can request refills through [place]: 11/19/2021  NEXT APPOINTMENT DATE:  01/17/2022  MEDICATION:Pantoprazole 40 mg  Is the patient out of medication?   PHARMACY: [facility]  Let patient know to contact pharmacy at the end of the day to make sure medication is ready.  Please notify patient to allow 48-72 hours to process  CLINICAL FILLS OUT ALL BELOW:   LAST REFILL:  QTY:  REFILL DATE:    OTHER COMMENTS:    Okay for refill?  Please advise

## 2021-12-15 ENCOUNTER — Other Ambulatory Visit: Payer: Self-pay

## 2021-12-15 MED ORDER — PANTOPRAZOLE SODIUM 40 MG PO TBEC: 40.0000 mg | DELAYED_RELEASE_TABLET | Freq: Two times a day (BID) | ORAL | 0 refills | Status: DC

## 2021-12-15 NOTE — Telephone Encounter (Signed)
Refills resent.

## 2022-01-04 ENCOUNTER — Other Ambulatory Visit: Payer: Self-pay | Admitting: Family

## 2022-01-04 ENCOUNTER — Telehealth: Payer: Self-pay

## 2022-01-04 NOTE — Telephone Encounter (Signed)
Patient called in that she is confused why her medication has been denied. Please give patient a call back.

## 2022-01-04 NOTE — Telephone Encounter (Signed)
Spoke with pt to address concerns about medication refill. Pt verbalized understanding and will request medication from pharmacy until her next scheduled appointment.

## 2022-01-17 ENCOUNTER — Ambulatory Visit: Payer: Medicare Other | Admitting: Family

## 2022-01-19 ENCOUNTER — Other Ambulatory Visit: Payer: Self-pay

## 2022-01-19 ENCOUNTER — Encounter: Payer: Self-pay | Admitting: Family

## 2022-01-19 ENCOUNTER — Ambulatory Visit (INDEPENDENT_AMBULATORY_CARE_PROVIDER_SITE_OTHER): Payer: Medicare Other | Admitting: Family

## 2022-01-19 VITALS — BP 145/83 | HR 79 | Temp 98.0°F | Ht 64.0 in | Wt 179.8 lb

## 2022-01-19 DIAGNOSIS — Z79890 Hormone replacement therapy (postmenopausal): Secondary | ICD-10-CM

## 2022-01-19 DIAGNOSIS — E559 Vitamin D deficiency, unspecified: Secondary | ICD-10-CM | POA: Diagnosis not present

## 2022-01-19 DIAGNOSIS — E039 Hypothyroidism, unspecified: Secondary | ICD-10-CM

## 2022-01-19 DIAGNOSIS — I10 Essential (primary) hypertension: Secondary | ICD-10-CM | POA: Diagnosis not present

## 2022-01-19 MED ORDER — ESTRADIOL 1 MG PO TABS: 1.0000 mg | ORAL_TABLET | Freq: Every day | ORAL | 1 refills | Status: DC

## 2022-01-19 MED ORDER — OLMESARTAN MEDOXOMIL-HCTZ 20-12.5 MG PO TABS: 1.0000 | ORAL_TABLET | Freq: Every day | ORAL | 1 refills | Status: DC

## 2022-01-19 NOTE — Progress Notes (Signed)
Subjective:     Patient ID: Michele Duncan, female    DOB: Apr 10, 1951, 71 y.o.   MRN: [id number]  Chief Complaint  Patient presents with   Hypertension   Hypothyroidism   HRT    check levels, refills   Vitamin D deficiency    check level    HPI: Hormone Replacement Therapy: Patient presents to discuss hormone replacement therapy. Patient is requesting refills of hormone replacement therapy due to hot flashes, moodiness, no energy, personal history of heart disease, and vaginal dryness. The patient is currently taking hormone replacement therapy.  Patient denies post-menopausal vaginal bleeding. last pap and mammogram were normal. Patient is hormone deficient due to menopause, which occurred around age 89.  Current hormone therapy: Estrogen: 1mg  qd and Progesterone: 300mg  qd. pt is tolerating reduced dose of Progesterone. Hypothyroidism: Patient presents today for followup of Hypothyroidism.  Patient reports positive compliance with daily medication, pt had to switch to Levothyroxine due to insurance no longer covering her NP thyroid. Patient denies any of the following symptoms: fatigue, cold intolerance, constipation, weight gain or inability to lose weight, muscle weakness, mental slowing, dry hair and skin. Last TSH and free T4: Lab Results  Component Value Date   FREE T4 0.54 (L) 10/15/2021   FREE T4 1.2 01/18/2021   TSH 0.55 10/15/2021   TSH 1.01 01/18/2021   TSH 1.32 06/09/2020  Vitamin D deficiency:  pt reports hx of taking RX Vit D, but then told she could take a daily dose. Pt currently taking 7,000units.  Hypertension: Patient is currently maintained on the following medications for blood pressure: Losartan/HCTZ Failed meds include: None Patient reports good compliance with blood pressure medications. Patient denies chest pain, headaches, shortness of breath or swelling. Last 3 blood pressure readings in our office are as follows: BP Readings from Last 3 Encounters:   01/19/22 (!) 145/83  12/02/21 (!) 142/82  10/15/21 (!) 152/82    There are no preventive care reminders to display for this patient.   Past Medical History:  Diagnosis Date   Anxiety    Arthritis    low back   Chronic anal fissure    Chronic low back pain    Essential hypertension, benign 10/17/2019   GERD (gastroesophageal reflux disease) 10/17/2019   Grade II internal hemorrhoids    Hemorrhoids, external    History of adenomatous polyp of colon    11-06-2017   HLD (hyperlipidemia) 10/17/2019   Hot flashes due to menopause 10/17/2019   Vitamin D deficiency disease 10/17/2019   Wears glasses     Past Surgical History:  Procedure Laterality Date   BREAST BIOPSY Left 1970s   benign    COLONOSCOPY N/A 11/06/2017   Procedure: COLONOSCOPY;  Surgeon: Danie Binder, MD;  [facility];  Service: Endoscopy;  Laterality: N/A;  1:45pm   HEMORRHOID SURGERY N/A 02/28/2018   Procedure: HEMORRHOIDECTOMY;  Surgeon: Michael Boston, MD;  Location: [place];  Service: General;  Laterality: N/A;   repair anal sphincter     SPHINCTEROTOMY N/A 02/28/2018   Procedure: LATERAL SPHINCTEROTOMY;  Surgeon: Michael Boston, MD;  Location: [place];  Service: General;  Laterality: N/A;    Outpatient Medications Prior to Visit  Medication Sig Dispense Refill   acetaminophen (TYLENOL) 650 MG CR tablet Take 650 mg by mouth every 8 (eight) hours as needed for pain.     Cholecalciferol (VITAMIN D-3 PO) Take 7,000 Int'l Units by mouth daily.  gabapentin (NEURONTIN) 300 MG capsule Take 300 mg by mouth 3 (three) times daily.     gabapentin (NEURONTIN) 600 MG tablet Take 1 tablet (600 mg total) by mouth 3 (three) times daily. 270 tablet 3   KLOR-CON M20 20 MEQ tablet TAKE 2 TABLETS EVERY       MORNING 180 tablet 1   levothyroxine (SYNTHROID) 175 MCG tablet Take 1 tablet (175 mcg total) by mouth daily. 90 tablet 0   naproxen (NAPROSYN) 500 MG tablet Take 1  tablet (500 mg total) by mouth 2 (two) times daily with a meal. 180 tablet 0   pantoprazole (PROTONIX) 40 MG tablet Take 1 tablet (40 mg total) by mouth 2 (two) times daily. 180 tablet 0   progesterone (PROMETRIUM) 100 MG capsule Take 3 capsules (300 mg total) by mouth at bedtime. 270 capsule 1   estradiol (ESTRACE) 1 MG tablet Take 1 mg by mouth daily.     hydrochlorothiazide (MICROZIDE) 12.5 MG capsule Take 1 capsule (12.5 mg total) by mouth daily. 90 capsule 0   losartan (COZAAR) 50 MG tablet Take 1 tablet (50 mg total) by mouth daily. 90 tablet 0   No facility-administered medications prior to visit.    Allergies  Allergen Reactions   Penicillins Shortness Of Breath and Itching    Has patient had a PCN reaction causing immediate rash, facial/tongue/throat swelling, SOB or lightheadedness with hypotension: Yes Has patient had a PCN reaction causing severe rash involving mucus membranes or skin necrosis: No Has patient had a PCN reaction that required hospitalization: No Has patient had a PCN reaction occurring within the last 10 years: No If all of the above answers are "NO", then may proceed with Cephalosporin use.    Codeine Itching        Objective:    Physical Exam Vitals and nursing note reviewed.  Constitutional:      Appearance: Normal appearance.  Cardiovascular:     Rate and Rhythm: Normal rate and regular rhythm.  Pulmonary:     Effort: Pulmonary effort is normal.     Breath sounds: Normal breath sounds.  Musculoskeletal:        General: Normal range of motion.  Skin:    General: Skin is warm and dry.  Neurological:     Mental Status: She is alert.  Psychiatric:        Mood and Affect: Mood normal.        Behavior: Behavior normal.    BP (!) 145/83    Pulse 79    Temp 98 F (36.7 C) (Temporal)    Ht $R'5\' 4"'pQ$  (1.626 m)    Wt 179 lb 12.8 oz (81.6 kg)    SpO2 98%    BMI 30.86 kg/m  Wt Readings from Last 3 Encounters:  01/19/22 179 lb 12.8 oz (81.6 kg)   12/02/21 176 lb 8 oz (80.1 kg)  10/15/21 179 lb (81.2 kg)       Assessment & Plan:   Problem List Items Addressed This Visit       Cardiovascular and Mediastinum   Essential hypertension, benign    Chronic - unstable - BP still elevated after taking meds, advised pt to increase Losartan/HCTZ to 1.5 pills qd and monitor BP at home and let me know if running consistently >130/90      Relevant Medications   olmesartan-hydrochlorothiazide (BENICAR HCT) 20-12.5 MG tablet   Other Relevant Orders   Comp Met (CMET)   Lipid panel  Endocrine   Acquired hypothyroidism - Primary    Chronic - checking labs today, pt switched from NP thyroid to Levothyroxine, want to be sure T4 in range      Relevant Orders   T4, free   TSH     Other   Vitamin D deficiency disease    Chronic - pt taking 7,000units qd, checking level today.      Relevant Orders   Vitamin D (25 hydroxy)   Hormone replacement therapy (HRT)    Chronic - pt lowered progesterone dose to $Remov'300mg'qaAyFY$  qd, tolerating, Estradiol still at $Remove'1mg'hyHlNFL$  qd, will check levels today.      Relevant Medications   estradiol (ESTRACE) 1 MG tablet   Other Relevant Orders   Progesterone   Estradiol    Meds ordered this encounter  Medications   estradiol (ESTRACE) 1 MG tablet    Sig: Take 1 tablet (1 mg total) by mouth daily.    Dispense:  90 tablet    Refill:  1    Order Specific Question:   Supervising Provider    Answer:   ANDY, CAMILLE L [[id number]]   olmesartan-hydrochlorothiazide (BENICAR HCT) 20-12.5 MG tablet    Sig: Take 1 tablet by mouth daily.    Dispense:  90 tablet    Refill:  1    D/C Losartan and HCTZ    Order Specific Question:   Supervising Provider    Answer:   ANDY, CAMILLE L [[id number]]

## 2022-01-19 NOTE — Assessment & Plan Note (Signed)
Chronic - unstable - BP still elevated after taking meds, advised pt to increase Losartan/HCTZ to 1.5 pills qd and monitor BP at home and let me know if running consistently >130/90

## 2022-01-19 NOTE — Assessment & Plan Note (Signed)
Chronic - pt lowered progesterone dose to 300mg  qd, tolerating, Estradiol still at 1mg  qd, will check levels today.

## 2022-01-19 NOTE — Patient Instructions (Signed)
It was very nice to see you today!  As discussed, for your blood pressure, take 1 & 1/2 pills of the Losartan ONLY, continue just 1 pill of the Hydrochlorothiazide daily until these run out. Then start the new medication I have sent in, Olmesartan- HCTZ, this is a combination of the 2 meds you were taking.  I refilled the Estrace, but not the Progesterone until I see your blood level. If there was only a slight drop in your level I'd like you to reduce to 200mg  qd.  I'm also waiting to see your other lab results before refilling your other meds:  K-Dur (Potassium) and Levothyroxine.  Please schedule a 1 month follow up appointment so I can see how your blood pressure is doing.    PLEASE NOTE:  If you had any lab tests please let us know if you have not heard back within a few days. You may see your results on MyChart before we have a chance to review them but we will give you a call once they are reviewed by [place]. If we ordered any referrals today, please let us know if you have not heard from their office within the next week.

## 2022-01-19 NOTE — Assessment & Plan Note (Signed)
Chronic - pt taking 7,000units qd, checking level today.

## 2022-01-19 NOTE — Assessment & Plan Note (Signed)
Chronic - checking labs today, pt switched from NP thyroid to Levothyroxine, want to be sure T4 in range

## 2022-01-20 LAB — PROGESTERONE: Progesterone: 13.7 ng/mL

## 2022-01-20 LAB — COMPREHENSIVE METABOLIC PANEL
ALT: 9 U/L (ref 0–35)
AST: 12 U/L (ref 0–37)
Albumin: 4.2 g/dL (ref 3.5–5.2)
Alkaline Phosphatase: 59 U/L (ref 39–117)
BUN: 12 mg/dL (ref 6–23)
CO2: 29 mEq/L (ref 19–32)
Calcium: 9.6 mg/dL (ref 8.4–10.5)
Chloride: 100 mEq/L (ref 96–112)
Creatinine, Ser: 0.8 mg/dL (ref 0.40–1.20)
GFR: 75.59 mL/min (ref >60.00)
Glucose, Bld: 88 mg/dL (ref 70–99)
Potassium: 3.8 mEq/L (ref 3.5–5.1)
Sodium: 136 mEq/L (ref 135–145)
Total Bilirubin: 0.5 mg/dL (ref 0.2–1.2)
Total Protein: 6.5 g/dL (ref 6.0–8.3)

## 2022-01-20 LAB — LIPID PANEL
Cholesterol: 206 mg/dL — ABNORMAL HIGH (ref 0–200)
HDL: 66.7 mg/dL (ref >39.00)
LDL Cholesterol: 110 mg/dL — ABNORMAL HIGH (ref 0–99)
NonHDL: 139.42
Total CHOL/HDL Ratio: 3
Triglycerides: 147 mg/dL (ref 0.0–149.0)
VLDL: 29.4 mg/dL (ref 0.0–40.0)

## 2022-01-20 LAB — T4, FREE: Free T4: 2.43 ng/dL — ABNORMAL HIGH (ref 0.60–1.60)

## 2022-01-20 LAB — VITAMIN D 25 HYDROXY (VIT D DEFICIENCY, FRACTURES): VITD: 70.56 ng/mL (ref 30.00–100.00)

## 2022-01-20 LAB — TSH: TSH: 0 u[IU]/mL — ABNORMAL LOW (ref 0.35–5.50)

## 2022-01-20 LAB — ESTRADIOL: Estradiol: 92 pg/mL

## 2022-01-21 ENCOUNTER — Other Ambulatory Visit: Payer: Self-pay

## 2022-01-21 ENCOUNTER — Other Ambulatory Visit: Payer: Self-pay | Admitting: Family

## 2022-01-21 ENCOUNTER — Telehealth: Payer: Self-pay | Admitting: Family

## 2022-01-21 DIAGNOSIS — E876 Hypokalemia: Secondary | ICD-10-CM | POA: Insufficient documentation

## 2022-01-21 DIAGNOSIS — E039 Hypothyroidism, unspecified: Secondary | ICD-10-CM

## 2022-01-21 MED ORDER — POTASSIUM CHLORIDE CRYS ER 20 MEQ PO TBCR: 40.0000 meq | EXTENDED_RELEASE_TABLET | Freq: Every morning | ORAL | 1 refills | Status: DC

## 2022-01-21 MED ORDER — LEVOTHYROXINE SODIUM 150 MCG PO TABS: 150.0000 ug | ORAL_TABLET | Freq: Every day | ORAL | 1 refills | Status: DC

## 2022-01-21 NOTE — Telephone Encounter (Signed)
I spoke with the pt to assure her that when Michele Duncan is able to review her labs, I will give her a call back. She also requested refills on medications. I made her aware that medications for Thyroid, Estradiol,and Potassium will be refilled if they weren't already. She agrees.

## 2022-01-21 NOTE — Telephone Encounter (Signed)
Patient would like a callback from [facility] concerning medication and lab results. The systems were down here in the office this past Wednesday.

## 2022-01-21 NOTE — Telephone Encounter (Signed)
I was able to review last note from [place], and go over the instructions for pt. Refills will be given after retesting labs. Estradiol was sent to pharmacy at appointment Wednesday. Pt was told to continue current BP medication until it is complete, and start new BP medication which is a combination of the 2 medication she previously had. She voices understanding, and was transferred to the front desk to schedule 1 month f/u.

## 2022-01-27 ENCOUNTER — Telehealth: Payer: Self-pay

## 2022-01-27 NOTE — Telephone Encounter (Signed)
ok to resend all RX

## 2022-01-27 NOTE — Telephone Encounter (Signed)
Patient states:  Levothyroxine, potassium Chloride, Estradiol and Benicar was sent to the incorrect pharmacy.  Is asking for these scripts to be sent to [place].  Please give patient a call once these scripts have been sent.

## 2022-01-28 ENCOUNTER — Other Ambulatory Visit: Payer: Self-pay

## 2022-01-28 DIAGNOSIS — E876 Hypokalemia: Secondary | ICD-10-CM

## 2022-01-28 DIAGNOSIS — I10 Essential (primary) hypertension: Secondary | ICD-10-CM

## 2022-01-28 DIAGNOSIS — E039 Hypothyroidism, unspecified: Secondary | ICD-10-CM

## 2022-01-28 DIAGNOSIS — Z79890 Hormone replacement therapy (postmenopausal): Secondary | ICD-10-CM

## 2022-01-28 MED ORDER — ESTRADIOL 1 MG PO TABS: 1.0000 mg | ORAL_TABLET | Freq: Every day | ORAL | 1 refills | Status: DC

## 2022-01-28 MED ORDER — LOSARTAN POTASSIUM-HCTZ 50-12.5 MG PO TABS: 1.0000 | ORAL_TABLET | Freq: Every day | ORAL | 1 refills | Status: DC

## 2022-01-28 MED ORDER — POTASSIUM CHLORIDE CRYS ER 20 MEQ PO TBCR: 40.0000 meq | EXTENDED_RELEASE_TABLET | Freq: Every morning | ORAL | 1 refills | Status: DC

## 2022-01-28 MED ORDER — LEVOTHYROXINE SODIUM 150 MCG PO TABS: 150.0000 ug | ORAL_TABLET | Freq: Every day | ORAL | 1 refills | Status: DC

## 2022-01-28 NOTE — Telephone Encounter (Signed)
Resent to Mail order. 

## 2022-02-16 ENCOUNTER — Ambulatory Visit (INDEPENDENT_AMBULATORY_CARE_PROVIDER_SITE_OTHER): Payer: Medicare Other | Admitting: Family

## 2022-02-16 VITALS — BP 133/81 | HR 91 | Temp 97.9°F | Ht 64.0 in | Wt 179.1 lb

## 2022-02-16 DIAGNOSIS — I10 Essential (primary) hypertension: Secondary | ICD-10-CM | POA: Diagnosis not present

## 2022-02-16 MED ORDER — LOSARTAN POTASSIUM 25 MG PO TABS: 25.0000 mg | ORAL_TABLET | Freq: Every day | ORAL | 1 refills | Status: DC

## 2022-02-16 NOTE — Progress Notes (Signed)
? ?Subjective:  ? ? ? Patient ID: ILYA Duncan, female    DOB: May 26, 1951, 71 y.o.   MRN: [id number] ? ?Chief Complaint  ?Patient presents with  ? Hypertension  ? ? ?HPI: ?Hypertension: Patient is currently maintained on the following medications for blood pressure: Losartan-HCTZ ?Failed meds include: none  ?Patient reports good compliance with blood pressure medications. ?Patient denies chest pain, headaches, shortness of breath or swelling. ?Last 3 blood pressure readings in our office are as follows: ?BP Readings from Last 3 Encounters:  ?02/16/22 133/81  ?01/19/22 (!) 145/83  ?12/02/21 (!) 142/82  ?  ? ?There are no preventive care reminders to display for this patient. ? ?Past Medical History:  ?Diagnosis Date  ? Anxiety   ? Arthritis   ? low back  ? Chronic anal fissure   ? Chronic low back pain   ? Essential hypertension, benign 10/17/2019  ? GERD (gastroesophageal reflux disease) 10/17/2019  ? Grade II internal hemorrhoids   ? Hemorrhoids, external   ? History of adenomatous polyp of colon   ? 11-06-2017  ? HLD (hyperlipidemia) 10/17/2019  ? Hot flashes due to menopause 10/17/2019  ? Vitamin D deficiency disease 10/17/2019  ? Wears glasses   ? ? ?Past Surgical History:  ?Procedure Laterality Date  ? BREAST BIOPSY Left 1970s  ? benign   ? COLONOSCOPY N/A 11/06/2017  ? Procedure: COLONOSCOPY;  Surgeon: Danie Binder, MD;  [facility];  Service: Endoscopy;  Laterality: N/A;  1:45pm  ? HEMORRHOID SURGERY N/A 02/28/2018  ? Procedure: HEMORRHOIDECTOMY;  Surgeon: Michael Boston, MD;  Location: [facility];  Service: General;  Laterality: N/A;  ? repair anal sphincter    ? SPHINCTEROTOMY N/A 02/28/2018  ? Procedure: LATERAL SPHINCTEROTOMY;  Surgeon: Michael Boston, MD;  Location: [facility];  Service: General;  Laterality: N/A;  ? ? ?Outpatient Medications Prior to Visit  ?Medication Sig Dispense Refill  ? acetaminophen (TYLENOL) 650 MG CR tablet Take 650 mg by mouth every 8  (eight) hours as needed for pain.    ? Cholecalciferol (VITAMIN D-3 PO) Take 7,000 Int'l Units by mouth daily.    ? estradiol (ESTRACE) 1 MG tablet Take 1 tablet (1 mg total) by mouth daily. 90 tablet 1  ? gabapentin (NEURONTIN) 300 MG capsule Take 300 mg by mouth 3 (three) times daily.    ? gabapentin (NEURONTIN) 600 MG tablet Take 1 tablet (600 mg total) by mouth 3 (three) times daily. 270 tablet 3  ? levothyroxine (SYNTHROID) 150 MCG tablet Take 1 tablet (150 mcg total) by mouth daily. 30 tablet 1  ? losartan-hydrochlorothiazide (HYZAAR) 50-12.5 MG tablet Take 1 tablet by mouth daily. 90 tablet 1  ? naproxen (NAPROSYN) 500 MG tablet Take 1 tablet (500 mg total) by mouth 2 (two) times daily with a meal. 180 tablet 0  ? pantoprazole (PROTONIX) 40 MG tablet Take 1 tablet (40 mg total) by mouth 2 (two) times daily. 180 tablet 0  ? potassium chloride SA (KLOR-CON M20) 20 MEQ tablet Take 2 tablets (40 mEq total) by mouth every morning. 180 tablet 1  ? progesterone (PROMETRIUM) 100 MG capsule Take 3 capsules (300 mg total) by mouth at bedtime. 270 capsule 1  ? ?No facility-administered medications prior to visit.  ? ? ?Allergies  ?Allergen Reactions  ? Penicillins Shortness Of Breath and Itching  ?  Has patient had a PCN reaction causing immediate rash, facial/tongue/throat swelling, SOB or lightheadedness with hypotension: Yes ?Has patient  had a PCN reaction causing severe rash involving mucus membranes or skin necrosis: No ?Has patient had a PCN reaction that required hospitalization: No ?Has patient had a PCN reaction occurring within the last 10 years: No ?If all of the above answers are "NO", then may proceed with Cephalosporin use. ?  ? Codeine Itching  ? ? ?   ?Objective:  ?  ?Physical Exam ?Vitals and nursing note reviewed.  ?Constitutional:   ?   Appearance: Normal appearance.  ?Cardiovascular:  ?   Rate and Rhythm: Normal rate and regular rhythm.  ?Pulmonary:  ?   Effort: Pulmonary effort is normal.  ?    Breath sounds: Normal breath sounds.  ?Musculoskeletal:     ?   General: Normal range of motion.  ?Skin: ?   General: Skin is warm and dry.  ?Neurological:  ?   Mental Status: She is alert.  ?Psychiatric:     ?   Mood and Affect: Mood normal.     ?   Behavior: Behavior normal.  ? ? ?BP 133/81 (BP Location: Left Arm, Patient Position: Sitting, Cuff Size: Large)   Pulse 91   Temp 97.9 ?F (36.6 ?C) (Temporal)   Ht '5\' 4"'$  (1.626 m)   Wt 179 lb 2 oz (81.3 kg)   SpO2 99%   BMI 30.75 kg/m?  ?Wt Readings from Last 3 Encounters:  ?02/16/22 179 lb 2 oz (81.3 kg)  ?01/19/22 179 lb 12.8 oz (81.6 kg)  ?12/02/21 176 lb 8 oz (80.1 kg)  ? ? ?   ?Assessment & Plan:  ? ?Problem List Items Addressed This Visit   ? ?  ? Cardiovascular and Mediastinum  ? Essential hypertension, benign - Primary  ?  Chronic - pt did not increase pill to 1.5, BP today better, but says she has readings at home in 140s. Advised to take the Losartan 50-12.5 and add Losartan '25mg'$  qam. pt has left over '50mg'$  pills, will take 1/2 and I am sending '25mg'$  pills to pharmacy. f/u in 2 mos for labs. ?  ?  ? Relevant Medications  ? losartan (COZAAR) 25 MG tablet  ? ? ?Meds ordered this encounter  ?Medications  ? losartan (COZAAR) 25 MG tablet  ?  Sig: Take 1 tablet (25 mg total) by mouth daily. Take with the 50-12.5 Losartan-HCTZ  ?  Dispense:  90 tablet  ?  Refill:  1  ?  Order Specific Question:   Supervising Provider  ?  Answer:   ANDY, CAMILLE L [[id number]]  ? ? ?Jeanie Sewer, NP ? ?

## 2022-02-16 NOTE — Assessment & Plan Note (Signed)
Chronic - pt did not increase pill to 1.5, BP today better, but says she has readings at home in 140s. Advised to take the Losartan 50-12.5 and add Losartan '25mg'$  qam. pt has left over '50mg'$  pills, will take 1/2 and I am sending '25mg'$  pills to pharmacy. f/u in 2 mos for labs. ?

## 2022-02-16 NOTE — Patient Instructions (Addendum)
It was very nice to see you today! ? ?As discussed, continue to take your Losartan-HCTZ 50-12.'5mg'$  every morning, but also add Losartan '25mg'$  (1/2 of '50mg'$  pills you currently have) and I have sent a new prescription for this to [facility] mail order. ?  ?Schedule a 2 month follow up visit to recheck your labs. ? ? ?PLEASE NOTE: ? ?If you had any lab tests please let us know if you have not heard back within a few days. You may see your results on MyChart before we have a chance to review them but we will give you a call once they are reviewed by [place]. If we ordered any referrals today, please let us know if you have not heard from their office within the next week.  ? ? ?

## 2022-03-15 ENCOUNTER — Other Ambulatory Visit: Payer: Self-pay | Admitting: Family

## 2022-04-09 ENCOUNTER — Other Ambulatory Visit: Payer: Self-pay | Admitting: Family

## 2022-04-09 DIAGNOSIS — E039 Hypothyroidism, unspecified: Secondary | ICD-10-CM

## 2022-04-18 ENCOUNTER — Ambulatory Visit (INDEPENDENT_AMBULATORY_CARE_PROVIDER_SITE_OTHER): Payer: Medicare Other | Admitting: Family

## 2022-04-18 ENCOUNTER — Encounter: Payer: Self-pay | Admitting: Family

## 2022-04-18 VITALS — BP 134/61 | HR 76 | Temp 97.8°F | Ht 64.0 in | Wt 184.5 lb

## 2022-04-18 DIAGNOSIS — G609 Hereditary and idiopathic neuropathy, unspecified: Secondary | ICD-10-CM | POA: Diagnosis not present

## 2022-04-18 DIAGNOSIS — E039 Hypothyroidism, unspecified: Secondary | ICD-10-CM | POA: Diagnosis not present

## 2022-04-18 MED ORDER — AMITRIPTYLINE HCL 10 MG PO TABS: 10.0000 mg | ORAL_TABLET | Freq: Every day | ORAL | 0 refills | Status: DC

## 2022-04-18 NOTE — Assessment & Plan Note (Addendum)
Chronic - lowered dose last visit, rechecking labs today. pt denies any missed doses. f/u 3 mos. ?

## 2022-04-18 NOTE — Assessment & Plan Note (Addendum)
Chronic - Unstable-  Gabapentin not helping these sx, she also takes for lumbar radiculopathy - pt states gynecology has not been able to help her. reports pain starting in vaginal are and moves upward, UAs are mostly negative. possibly interstitial pain? Starting Elavil qhs, if not helping, will refer to urogenital specialist. ?

## 2022-04-18 NOTE — Progress Notes (Signed)
? ?Subjective:  ? ? ? Patient ID: Michele Duncan, female    DOB: 1951/01/01, 71 y.o.   MRN: [id number] ? ?Chief Complaint  ?Patient presents with  ? Vaginal Pain  ?  Pt  pain in vaginal and pelvic pain for about a year. OBGYN said that they are not sure what it is. Sometimes while urinating and burning random times of the day but not like a UTI.   ? Hypothyroidism  ?  Labs  ? ?HPI: ?Hypothyroidism: Patient presents today for followup of Hypothyroidism.  ?Patient reports positive compliance with daily medication, pt had to switch to Levothyroxine due to insurance no longer covering her NP thyroid. ?Patient denies any of the following symptoms: fatigue, cold intolerance, constipation, weight gain or inability to lose weight, muscle weakness, mental slowing, dry hair and skin. Here today to recheck labs since changing dose 2 mos ago. ?Neuropathy: She describes symptoms of numbness, burning, and tingling. Onset of symptoms was gradual, starting about several years ago. Symptoms are currently of moderate severity. Symptoms occur intermittently and last day. The patient denies lancinating pain, squeezing, hypersensitivity, and allodynia. Symptoms are symmetric.   Previous treatment has included Gabapentin, which has not improved symptoms. Pt reports pain starting in her vaginal/perineal area and moves upward. ? ?Assessment & Plan:  ? ?Problem List Items Addressed This Visit   ? ?  ? Endocrine  ? Acquired hypothyroidism - Primary  ?  Chronic - lowered dose last visit, rechecking labs today. pt denies any missed doses. f/u 3 mos. ? ?  ?  ? Relevant Orders  ? T4, free  ? TSH  ?  ? Nervous and Auditory  ? Idiopathic peripheral neuropathy  ?  Chronic - Unstable-  Gabapentin not helping these sx, she also takes for lumbar radiculopathy - pt states gynecology has not been able to help her. reports pain starting in vaginal are and moves upward, UAs are mostly negative. possibly interstitial pain? Starting Elavil qhs, if not helping,  will refer to urogenital specialist. ? ?  ?  ? Relevant Medications  ? amitriptyline (ELAVIL) 10 MG tablet  ? ? ?Outpatient Medications Prior to Visit  ?Medication Sig Dispense Refill  ? acetaminophen (TYLENOL) 650 MG CR tablet Take 650 mg by mouth every 8 (eight) hours as needed for pain.    ? Cholecalciferol (VITAMIN D-3 PO) Take 7,000 Int'l Units by mouth daily.    ? estradiol (ESTRACE) 1 MG tablet Take 1 tablet (1 mg total) by mouth daily. 90 tablet 1  ? gabapentin (NEURONTIN) 300 MG capsule Take 300 mg by mouth 3 (three) times daily.    ? gabapentin (NEURONTIN) 600 MG tablet Take 1 tablet (600 mg total) by mouth 3 (three) times daily. 270 tablet 3  ? levothyroxine (SYNTHROID) 150 MCG tablet TAKE 1 TABLET DAILY        (REDUCED DOSE, DISCONTINUE 175MCG) 30 tablet 1  ? losartan (COZAAR) 25 MG tablet Take 1 tablet (25 mg total) by mouth daily. Take with the 50-12.5 Losartan-HCTZ 90 tablet 1  ? losartan-hydrochlorothiazide (HYZAAR) 50-12.5 MG tablet Take 1 tablet by mouth daily. 90 tablet 1  ? naproxen (NAPROSYN) 500 MG tablet TAKE 1 TABLET TWICE DAILY  WITH MEALS 180 tablet 0  ? pantoprazole (PROTONIX) 40 MG tablet Take 1 tablet (40 mg total) by mouth 2 (two) times daily. 180 tablet 0  ? potassium chloride SA (KLOR-CON M20) 20 MEQ tablet Take 2 tablets (40 mEq total) by mouth every morning. 180 tablet  1  ? progesterone (PROMETRIUM) 100 MG capsule Take 3 capsules (300 mg total) by mouth at bedtime. 270 capsule 1  ? ?No facility-administered medications prior to visit.  ? ? ?Past Medical History:  ?Diagnosis Date  ? Anxiety   ? Arthritis   ? low back  ? Chronic low back pain   ? Essential hypertension, benign 10/17/2019  ? GERD (gastroesophageal reflux disease) 10/17/2019  ? Grade II internal hemorrhoids   ? History of adenomatous polyp of colon   ? 11-06-2017  ? HLD (hyperlipidemia) 10/17/2019  ? Hot flashes due to menopause 10/17/2019  ? Vitamin D deficiency disease 10/17/2019  ? Wears glasses   ? ? ?Past Surgical  History:  ?Procedure Laterality Date  ? BREAST BIOPSY Left 1970s  ? benign   ? COLONOSCOPY N/A 11/06/2017  ? Procedure: COLONOSCOPY;  Surgeon: Danie Binder, MD;  [facility];  Service: Endoscopy;  Laterality: N/A;  1:45pm  ? HEMORRHOID SURGERY N/A 02/28/2018  ? Procedure: HEMORRHOIDECTOMY;  Surgeon: Michael Boston, MD;  Location: [facility];  Service: General;  Laterality: N/A;  ? repair anal sphincter    ? SPHINCTEROTOMY N/A 02/28/2018  ? Procedure: LATERAL SPHINCTEROTOMY;  Surgeon: Michael Boston, MD;  Location: [facility];  Service: General;  Laterality: N/A;  ? ? ?Allergies  ?Allergen Reactions  ? Penicillins Shortness Of Breath and Itching  ?  Has patient had a PCN reaction causing immediate rash, facial/tongue/throat swelling, SOB or lightheadedness with hypotension: Yes ?Has patient had a PCN reaction causing severe rash involving mucus membranes or skin necrosis: No ?Has patient had a PCN reaction that required hospitalization: No ?Has patient had a PCN reaction occurring within the last 10 years: No ?If all of the above answers are "NO", then may proceed with Cephalosporin use. ?  ? Codeine Itching  ? ? ?   ?Objective:  ?  ?Physical Exam ?Vitals and nursing note reviewed.  ?Constitutional:   ?   Appearance: Normal appearance.  ?Cardiovascular:  ?   Rate and Rhythm: Normal rate and regular rhythm.  ?Pulmonary:  ?   Effort: Pulmonary effort is normal.  ?   Breath sounds: Normal breath sounds.  ?Musculoskeletal:     ?   General: Normal range of motion.  ?Skin: ?   General: Skin is warm and dry.  ?Neurological:  ?   Mental Status: She is alert.  ?Psychiatric:     ?   Mood and Affect: Mood normal.     ?   Behavior: Behavior normal.  ? ? ?BP 134/61 (BP Location: Left Arm, Patient Position: Sitting, Cuff Size: Large)   Pulse 76   Temp 97.8 ?F (36.6 ?C) (Temporal)   Ht '5\' 4"'$  (1.626 m)   Wt 184 lb 8 oz (83.7 kg)   SpO2 99%   BMI 31.67 kg/m?  ?Wt Readings from Last 3  Encounters:  ?04/18/22 184 lb 8 oz (83.7 kg)  ?02/16/22 179 lb 2 oz (81.3 kg)  ?01/19/22 179 lb 12.8 oz (81.6 kg)  ? ? ?   ? ?Meds ordered this encounter  ?Medications  ? amitriptyline (ELAVIL) 10 MG tablet  ?  Sig: Take 1 tablet (10 mg total) by mouth at bedtime. OK to increase to 2 pills qhs if needed for symptom relief.  ?  Dispense:  60 tablet  ?  Refill:  0  ?  Order Specific Question:   Supervising Provider  ?  Answer:   ANDY, CAMILLE L [  [id number]]  ? ? ?Jeanie Sewer, NP ? ?

## 2022-04-18 NOTE — Patient Instructions (Signed)
It was very nice to see you today! ? ?Go to the lab for blood work today. ? ?For your finger locking and hand stiffness you can try Turmeric with Curcumin 3 times daily, follow directions on the bottle. Monitor your blood pressure as it may cause an increase, but rare.   ? ?I have also sent Amitriptyline to try for the vaginal burning pain you have been having as well as other help other neuropathy pain and helps with sleep. Take 30 minutes prior to bedtime. Start with 1 pill, if not too drowsy in am, can take 2 pills. ? ?Follow up in 3 mos or sooner if needed. ? ? ? ?PLEASE NOTE: ? ?If you had any lab tests please let us know if you have not heard back within a few days. You may see your results on MyChart before we have a chance to review them but we will give you a call once they are reviewed by [place]. If we ordered any referrals today, please let us know if you have not heard from their office within the next week.  ? ? ?

## 2022-04-19 LAB — TSH: TSH: 0.01 u[IU]/mL — ABNORMAL LOW (ref 0.35–5.50)

## 2022-04-19 LAB — T4, FREE: Free T4: 1.65 ng/dL — ABNORMAL HIGH (ref 0.60–1.60)

## 2022-04-19 NOTE — Progress Notes (Signed)
Hi Michele Duncan, ? ?Thyroid level came down a little more. Let's stay on your current dose and recheck next visit. ? ?Take care!

## 2022-05-30 ENCOUNTER — Ambulatory Visit (INDEPENDENT_AMBULATORY_CARE_PROVIDER_SITE_OTHER): Payer: Medicare Other

## 2022-05-30 DIAGNOSIS — Z0000 Encounter for general adult medical examination without abnormal findings: Secondary | ICD-10-CM

## 2022-06-10 ENCOUNTER — Other Ambulatory Visit: Payer: Self-pay | Admitting: Family

## 2022-06-10 DIAGNOSIS — E039 Hypothyroidism, unspecified: Secondary | ICD-10-CM

## 2022-06-12 ENCOUNTER — Other Ambulatory Visit: Payer: Self-pay | Admitting: Family

## 2022-06-19 ENCOUNTER — Other Ambulatory Visit: Payer: Self-pay | Admitting: Family

## 2022-06-19 DIAGNOSIS — G609 Hereditary and idiopathic neuropathy, unspecified: Secondary | ICD-10-CM

## 2022-07-21 ENCOUNTER — Encounter: Payer: Self-pay | Admitting: Family

## 2022-07-21 ENCOUNTER — Ambulatory Visit (INDEPENDENT_AMBULATORY_CARE_PROVIDER_SITE_OTHER): Payer: Medicare Other | Admitting: Family

## 2022-07-21 VITALS — BP 132/83 | HR 90 | Temp 97.9°F | Ht 64.0 in | Wt 183.2 lb

## 2022-07-21 DIAGNOSIS — Z79890 Hormone replacement therapy (postmenopausal): Secondary | ICD-10-CM | POA: Diagnosis not present

## 2022-07-21 DIAGNOSIS — E039 Hypothyroidism, unspecified: Secondary | ICD-10-CM | POA: Diagnosis not present

## 2022-07-21 DIAGNOSIS — I10 Essential (primary) hypertension: Secondary | ICD-10-CM

## 2022-07-21 DIAGNOSIS — G609 Hereditary and idiopathic neuropathy, unspecified: Secondary | ICD-10-CM | POA: Diagnosis not present

## 2022-07-21 LAB — TSH: TSH: 0.01 u[IU]/mL — ABNORMAL LOW (ref 0.35–5.50)

## 2022-07-21 LAB — T4, FREE: Free T4: 1.74 ng/dL — ABNORMAL HIGH (ref 0.60–1.60)

## 2022-07-21 MED ORDER — PROGESTERONE 200 MG PO CAPS: 200.0000 mg | ORAL_CAPSULE | Freq: Every evening | ORAL | 1 refills | Status: DC

## 2022-07-21 MED ORDER — AMITRIPTYLINE HCL 25 MG PO TABS: 25.0000 mg | ORAL_TABLET | Freq: Every day | ORAL | 2 refills | Status: DC

## 2022-07-21 NOTE — Progress Notes (Signed)
Patient ID: Michele Duncan, female    DOB: 05-28-1951, 71 y.o.   MRN: [id number]  Chief Complaint  Patient presents with   Follow-up    Hypertension, Neuropathy     HPI: Hypertension: Patient is currently maintained on the following medications for blood pressure: Losartan-HCTZ Patient reports good compliance with blood pressure medications. Patient denies chest pain, headaches, shortness of breath or swelling. Last 3 blood pressure readings in our office are as follows: BP Readings from Last 3 Encounters:  07/21/22 132/83  04/18/22 134/61  02/16/22 133/81  Hypothyroidism: Patient presents today for followup of Hypothyroidism.  Patient reports positive compliance with daily medication, pt had to switch to Levothyroxine due to insurance no longer covering her NP thyroid. Patient denies any of the following symptoms: fatigue, cold intolerance, constipation, weight gain or inability to lose weight, muscle weakness, mental slowing, dry hair and skin. Here today to recheck labs since changing dose 3 mos ago. Neuropathy: She describes symptoms of numbness, burning, and tingling. Onset of symptoms was gradual, starting about several years ago. Symptoms are currently of moderate severity. Symptoms occur intermittently and last day. The patient denies lancinating pain, squeezing, hypersensitivity, and allodynia. Symptoms are symmetric.   Previous treatment has included Gabapentin, which has not improved symptoms. Pt reports pain starting in her vaginal/perineal area and moves upward. Reports starting the Elavil, but no change in sx and no side effects.  Assessment & Plan:   Problem List Items Addressed This Visit       Cardiovascular and Mediastinum   Essential hypertension, benign    Chronic Taking Losartan '50mg'$ -12.'5mg'$  with '25mg'$  Losartan BP ok today no refill needed f/u in 3 mos        Endocrine   Acquired hypothyroidism - Primary    chronic  taking Levothyroxine 131mg switched from  NP Thyroid d/t no ins. coverage rechecking labs today f/u in 3 months or prn      Relevant Orders   T4, free (Completed)   TSH (Completed)     Nervous and Auditory   Idiopathic peripheral neuropathy    Not at goal - Chronic started Elavil '10mg'$  qhs no change in sx, pt reports no SE Increasing dose to '25mg'$  qd f/u in 3 mos or prn      Relevant Medications   amitriptyline (ELAVIL) 25 MG tablet     Other   Hormone replacement therapy (HRT)   Relevant Medications   progesterone (PROMETRIUM) 200 MG capsule    Subjective:    Outpatient Medications Prior to Visit  Medication Sig Dispense Refill   acetaminophen (TYLENOL) 650 MG CR tablet Take 650 mg by mouth every 8 (eight) hours as needed for pain.     Cholecalciferol (VITAMIN D-3 PO) Take 7,000 Int'l Units by mouth daily.     estradiol (ESTRACE) 1 MG tablet Take 1 tablet (1 mg total) by mouth daily. 90 tablet 1   gabapentin (NEURONTIN) 300 MG capsule Take 300 mg by mouth 3 (three) times daily.     gabapentin (NEURONTIN) 600 MG tablet Take 1 tablet (600 mg total) by mouth 3 (three) times daily. 270 tablet 3   levothyroxine (SYNTHROID) 150 MCG tablet TAKE 1 TABLET DAILY        (REDUCED DOSE, DISCONTINUE 175MCG) 30 tablet 1   losartan (COZAAR) 25 MG tablet Take 1 tablet (25 mg total) by mouth daily. Take with the 50-12.5 Losartan-HCTZ 90 tablet 1   losartan-hydrochlorothiazide (HYZAAR) 50-12.5 MG tablet Take 1 tablet by mouth daily.  90 tablet 1   naproxen (NAPROSYN) 500 MG tablet TAKE 1 TABLET TWICE DAILY  WITH MEALS 180 tablet 0   pantoprazole (PROTONIX) 40 MG tablet TAKE 1 TABLET TWICE A DAY 180 tablet 0   potassium chloride SA (KLOR-CON M20) 20 MEQ tablet Take 2 tablets (40 mEq total) by mouth every morning. 180 tablet 1   amitriptyline (ELAVIL) 10 MG tablet TAKE 1 TABLET AT BEDTIME, OKAY TO INCREASE TO 2 TABLETS AT BEDTIME IF NEEDED FOR SYMPTOM RELIEF 60 tablet 0   progesterone (PROMETRIUM) 100 MG capsule Take 3 capsules (300 mg  total) by mouth at bedtime. 270 capsule 1   No facility-administered medications prior to visit.   Past Medical History:  Diagnosis Date   Anxiety    Arthritis    low back   Chronic low back pain    Essential hypertension, benign 10/17/2019   External hemorrhoids with pain 02/28/2018   GERD (gastroesophageal reflux disease) 10/17/2019   Grade II internal hemorrhoids    History of adenomatous polyp of colon    11-06-2017   HLD (hyperlipidemia) 10/17/2019   Hot flashes due to menopause 10/17/2019   Rectal bleeding 11/02/2017   Vitamin D deficiency disease 10/17/2019   Wears glasses    Past Surgical History:  Procedure Laterality Date   BREAST BIOPSY Left 1970s   benign    COLONOSCOPY N/A 11/06/2017   Procedure: COLONOSCOPY;  Surgeon: Danie Binder, MD;  [facility];  Service: Endoscopy;  Laterality: N/A;  1:45pm   HEMORRHOID SURGERY N/A 02/28/2018   Procedure: HEMORRHOIDECTOMY;  Surgeon: Michael Boston, MD;  Location: [place];  Service: General;  Laterality: N/A;   repair anal sphincter     SPHINCTEROTOMY N/A 02/28/2018   Procedure: LATERAL SPHINCTEROTOMY;  Surgeon: Michael Boston, MD;  Location: [place];  Service: General;  Laterality: N/A;   Allergies  Allergen Reactions   Penicillins Shortness Of Breath and Itching    Has patient had a PCN reaction causing immediate rash, facial/tongue/throat swelling, SOB or lightheadedness with hypotension: Yes Has patient had a PCN reaction causing severe rash involving mucus membranes or skin necrosis: No Has patient had a PCN reaction that required hospitalization: No Has patient had a PCN reaction occurring within the last 10 years: No If all of the above answers are "NO", then may proceed with Cephalosporin use.    Codeine Itching      Objective:    Physical Exam Vitals and nursing note reviewed.  Constitutional:      Appearance: Normal appearance.  Cardiovascular:     Rate  and Rhythm: Normal rate and regular rhythm.  Pulmonary:     Effort: Pulmonary effort is normal.     Breath sounds: Normal breath sounds.  Musculoskeletal:        General: Normal range of motion.  Skin:    General: Skin is warm and dry.  Neurological:     Mental Status: She is alert.  Psychiatric:        Mood and Affect: Mood normal.        Behavior: Behavior normal.    BP 132/83 (BP Location: Left Arm, Patient Position: Sitting, Cuff Size: Large)   Pulse 90   Temp 97.9 F (36.6 C) (Temporal)   Ht '5\' 4"'$  (1.626 m)   Wt 183 lb 4 oz (83.1 kg)   SpO2 98%   BMI 31.45 kg/m  Wt Readings from Last 3 Encounters:  07/21/22 183 lb 4 oz (83.1  kg)  04/18/22 184 lb 8 oz (83.7 kg)  02/16/22 179 lb 2 oz (81.3 kg)       Jeanie Sewer, NP

## 2022-07-21 NOTE — Assessment & Plan Note (Signed)
   Chronic  Taking Losartan '50mg'$ -12.'5mg'$  with '25mg'$  Losartan  BP ok today  no refill needed  f/u in 3 mos

## 2022-07-21 NOTE — Assessment & Plan Note (Addendum)
   chronic   taking Levothyroxine 14mg  switched from NP Thyroid d/t no ins. coverage  rechecking labs today  f/u in 3 months or prn

## 2022-07-21 NOTE — Assessment & Plan Note (Addendum)
   Not at goal - Chronic  started Elavil '10mg'$  qhs  no change in sx, pt reports no SE  Increasing dose to '25mg'$  qd  f/u in 3 mos or prn

## 2022-07-26 MED ORDER — LEVOTHYROXINE SODIUM 137 MCG PO TABS
137.0000 ug | ORAL_TABLET | Freq: Every day | ORAL | 1 refills | Status: DC
Start: 2022-07-26 — End: 2022-09-27

## 2022-07-26 NOTE — Addendum Note (Signed)
Addended byJeanie Sewer on: 07/26/2022 05:19 PM   Modules accepted: Orders

## 2022-07-26 NOTE — Progress Notes (Signed)
We are still not quite at goal with your thyroid level. I gave you 128mg last time, so we need to reduce again, and I have sent in 1331m dose this time. It can take a few dose changes when switching from NP thyroid med to Levothyroxine. We should recheck your level again in 2-3 months. Let me know if you have any questions!

## 2022-07-27 ENCOUNTER — Other Ambulatory Visit: Payer: Self-pay | Admitting: Neurology

## 2022-07-27 ENCOUNTER — Other Ambulatory Visit: Payer: Self-pay | Admitting: Family

## 2022-07-27 DIAGNOSIS — I10 Essential (primary) hypertension: Secondary | ICD-10-CM

## 2022-08-09 ENCOUNTER — Telehealth: Payer: Self-pay | Admitting: Family

## 2022-08-09 DIAGNOSIS — Z79890 Hormone replacement therapy (postmenopausal): Secondary | ICD-10-CM

## 2022-08-09 MED ORDER — ESTRADIOL 2 MG PO TABS: 1.0000 mg | ORAL_TABLET | Freq: Every day | ORAL | 0 refills | Status: DC

## 2022-08-09 NOTE — Telephone Encounter (Signed)
RX sent to [facility] in [place].

## 2022-08-09 NOTE — Telephone Encounter (Signed)
  Patient requests dosage for the medication listed below be changed from 1 mg to 2 mg so that Patient can cut each tablet in half and the cost at the Pharmacy (changed Pharmacy for this RX) would only be $6.00 wether tablets are 1 mg or 2 mg (getting twice as much medication for the same price)   LAST APPOINTMENT DATE:  Please schedule appointment if longer than 1 year  07/21/22  NEXT APPOINTMENT DATE:  10/21/22  MEDICATION:estradiol (ESTRACE)   Is the patient out of medication? No  PHARMACY: [place], [place] - [address] [place] [address] Phone:  [phone]  Fax:  [phone]     Patient states if questions please contact Patient   Let patient know to contact pharmacy at the end of the day to make sure medication is ready.  Please notify patient to allow 48-72 hours to process

## 2022-08-15 NOTE — Telephone Encounter (Signed)
I called and spoke with pt, letting her know her refill has been sent in to her pharmacy. Pt gave a verbalized understanding.

## 2022-08-22 ENCOUNTER — Other Ambulatory Visit: Payer: Self-pay

## 2022-08-22 ENCOUNTER — Telehealth: Payer: Self-pay | Admitting: Family

## 2022-08-22 DIAGNOSIS — G609 Hereditary and idiopathic neuropathy, unspecified: Secondary | ICD-10-CM

## 2022-08-22 MED ORDER — AMITRIPTYLINE HCL 25 MG PO TABS: 25.0000 mg | ORAL_TABLET | Freq: Every day | ORAL | 2 refills | Status: DC

## 2022-08-22 NOTE — Telephone Encounter (Signed)
Pt is needing to have rx transferred to [facility] in [place] due to cheaper price.   MEDICATION: amitriptyline (ELAVIL) 25 MG tablet

## 2022-08-22 NOTE — Telephone Encounter (Signed)
RX sent

## 2022-08-29 ENCOUNTER — Encounter: Payer: Self-pay | Admitting: *Deleted

## 2022-09-08 ENCOUNTER — Other Ambulatory Visit: Payer: Self-pay | Admitting: Family

## 2022-09-15 ENCOUNTER — Other Ambulatory Visit (HOSPITAL_COMMUNITY): Payer: Self-pay | Admitting: Obstetrics & Gynecology

## 2022-09-15 DIAGNOSIS — N760 Acute vaginitis: Secondary | ICD-10-CM | POA: Diagnosis not present

## 2022-09-15 DIAGNOSIS — Z6832 Body mass index (BMI) 32.0-32.9, adult: Secondary | ICD-10-CM | POA: Diagnosis not present

## 2022-09-15 DIAGNOSIS — Z01419 Encounter for gynecological examination (general) (routine) without abnormal findings: Secondary | ICD-10-CM | POA: Diagnosis not present

## 2022-09-15 DIAGNOSIS — Z1231 Encounter for screening mammogram for malignant neoplasm of breast: Secondary | ICD-10-CM

## 2022-09-19 DIAGNOSIS — Z23 Encounter for immunization: Secondary | ICD-10-CM | POA: Diagnosis not present

## 2022-09-27 ENCOUNTER — Other Ambulatory Visit: Payer: Self-pay | Admitting: Family

## 2022-09-27 DIAGNOSIS — E039 Hypothyroidism, unspecified: Secondary | ICD-10-CM

## 2022-10-03 ENCOUNTER — Ambulatory Visit (HOSPITAL_COMMUNITY)
Admission: RE | Admit: 2022-10-03 | Discharge: 2022-10-03 | Disposition: A | Discharge status: Home / Self Care | Payer: Medicare Other | Source: Ambulatory Visit | Attending: Obstetrics & Gynecology | Admitting: Obstetrics & Gynecology

## 2022-10-03 ENCOUNTER — Encounter (HOSPITAL_COMMUNITY): Payer: Self-pay

## 2022-10-03 DIAGNOSIS — Z1231 Encounter for screening mammogram for malignant neoplasm of breast: Secondary | ICD-10-CM | POA: Insufficient documentation

## 2022-10-06 ENCOUNTER — Other Ambulatory Visit: Payer: Self-pay | Admitting: Neurology

## 2022-10-11 ENCOUNTER — Other Ambulatory Visit: Payer: Self-pay

## 2022-10-11 ENCOUNTER — Telehealth: Payer: Self-pay | Admitting: Family

## 2022-10-11 MED ORDER — GABAPENTIN 300 MG PO CAPS: 300.0000 mg | ORAL_CAPSULE | Freq: Three times a day (TID) | ORAL | 0 refills | Status: DC

## 2022-10-11 NOTE — Telephone Encounter (Signed)
Patient states Dr. Jennye Moccasin no longer prescribes the following RX request due to Patient no longer sees Dr. Jennye Moccasin. Patient requests the following:   LAST APPOINTMENT DATE:  Please schedule appointment if longer than 1 year  07/21/22  NEXT APPOINTMENT DATE: 10/17/22  MEDICATION: gabapentin (NEURONTIN) 600 MG tablet   Is the patient out of medication? Yes  [facility] [place] [facility] [place] [facility] [place] [facility] Phone: [phone]  Fax: [phone]      Let patient know to contact pharmacy at the end of the day to make sure medication is ready.  Please notify patient to allow 48-72 hours to process

## 2022-10-13 NOTE — Telephone Encounter (Signed)
Patient states Pharmacy received RX for 300 MG of the following medication instead of 600 MG.  Patient requests RX for :  gabapentin (NEURONTIN) 600 MG tablet  Be sent to  [place], [place] to [facility] Phone: [phone]  Fax: [phone]

## 2022-10-17 ENCOUNTER — Other Ambulatory Visit: Payer: Self-pay

## 2022-10-17 ENCOUNTER — Ambulatory Visit (INDEPENDENT_AMBULATORY_CARE_PROVIDER_SITE_OTHER): Payer: Medicare Other | Admitting: Family

## 2022-10-17 ENCOUNTER — Encounter: Payer: Self-pay | Admitting: Family

## 2022-10-17 VITALS — BP 135/82 | HR 86 | Temp 97.8°F | Ht 64.0 in | Wt 183.0 lb

## 2022-10-17 DIAGNOSIS — G609 Hereditary and idiopathic neuropathy, unspecified: Secondary | ICD-10-CM

## 2022-10-17 DIAGNOSIS — E039 Hypothyroidism, unspecified: Secondary | ICD-10-CM | POA: Diagnosis not present

## 2022-10-17 LAB — TSH: TSH: 0.02 u[IU]/mL — ABNORMAL LOW (ref 0.35–5.50)

## 2022-10-17 LAB — T4, FREE: Free T4: 1.21 ng/dL (ref 0.60–1.60)

## 2022-10-17 MED ORDER — AMITRIPTYLINE HCL 50 MG PO TABS: 50.0000 mg | ORAL_TABLET | Freq: Every day | ORAL | 3 refills | Status: DC

## 2022-10-17 MED ORDER — GABAPENTIN 600 MG PO TABS: 600.0000 mg | ORAL_TABLET | Freq: Three times a day (TID) | ORAL | 3 refills | Status: DC

## 2022-10-17 MED ORDER — GABAPENTIN 300 MG PO CAPS: 600.0000 mg | ORAL_CAPSULE | Freq: Three times a day (TID) | ORAL | 3 refills | Status: DC

## 2022-10-17 NOTE — Assessment & Plan Note (Addendum)
chronic Increased Elavil to '25mg'$  qhs last visit, still not difference in perineal pain - no increased SE, but does help her sleep increase Elavil to '50mg'$  qhs advised to decrease her Gabapentin by '300mg'$  each week since not helping perineal sx and may not need such a high dose for foot neuropathy- advised on long term adverse effects of Gabapentin stop decreasing when sx return, and then increase by '300mg'$  and stay on that dose f/u 4 mos or sooner

## 2022-10-17 NOTE — Patient Instructions (Addendum)
It was very nice to see you today!   I will review your lab results via MyChart in a few days.  As discussed, since the Gabapentin is not helping your internal burning, but does help the burning in your feet, it is still a good idea to cut back if possible on your dose to protect your kidneys.  I am sending '300mg'$  capsules to your pharmacy, take 2 capsules 3 times per day, but drop 1 capsule per day per week as long as your symptoms do not return in your feet. If they do, then add back 1 capsule per day.  Call back if any questions.      PLEASE NOTE:  If you had any lab tests please let us know if you have not heard back within a few days. You may see your results on MyChart before we have a chance to review them but we will give you a call once they are reviewed by [place]. If we ordered any referrals today, please let us know if you have not heard from their office within the next week.

## 2022-10-17 NOTE — Assessment & Plan Note (Signed)
chronic taking 16mg, reduced last visit, pt reports continued fatigue rechecking levels again today, last T4 still slightly high sending refill based on results f/u 687mo

## 2022-10-17 NOTE — Telephone Encounter (Signed)
Rx was sent to pharmacy. 

## 2022-10-17 NOTE — Progress Notes (Signed)
Patient ID: Michele Duncan, female    DOB: 28-Nov-1951, 71 y.o.   MRN: [id number]  Chief Complaint  Patient presents with   Hypothyroidism   burning pain    HPI:  Hypothyroidism: Patient presents today for followup of Hypothyroidism.  Patient reports positive compliance with daily medication, pt had to switch to Levothyroxine due to insurance no longer covering her NP thyroid. Levothyroxine dose decreased last visit to 1132mg, pt here today to have levels rechecked. Patient denies any of the following symptoms: fatigue, cold intolerance, constipation, weight gain or inability to lose weight, muscle weakness, mental slowing, dry hair and skin. Here today to recheck labs since changing dose 3 mos ago.  Neuropathy: She describes symptoms of numbness, burning, and tingling. Onset of symptoms was gradual, starting about several years ago. Symptoms are currently of moderate severity. Symptoms occur intermittently and last all day. The patient denies lancinating pain, squeezing, hypersensitivity, and allodynia. Symptoms are symmetric in both feet, but also reports internal perineal burning. Pt reports pain starting in her vaginal/perineal area and moves upward.  Previous treatment has included Gabapentin max dose of '3600mg'$  qd, which has not improved symptoms for her perineal area, but helps her feet. . Reports starting the Elavil, but no change in sx and no side effects, advised last time to increase to '25mg'$ , pt still reports no improvement in perineal sx, but may be helping foot neuropathy.  Assessment & Plan:   Problem List Items Addressed This Visit       Endocrine   Acquired hypothyroidism - Primary    chronic taking 1352m, reduced last visit, pt reports continued fatigue rechecking levels again today, last T4 still slightly high sending refill based on results f/u 32m232mo    Relevant Orders   T4, free   TSH     Nervous and Auditory   Idiopathic peripheral neuropathy     chronic Increased Elavil to '25mg'$  qhs last visit, still not difference in perineal pain - no increased SE, but does help her sleep increase Elavil to '50mg'$  qhs advised to decrease her Gabapentin by '300mg'$  each week since not helping perineal sx and may not need such a high dose for foot neuropathy- advised on long term adverse effects of Gabapentin stop decreasing when sx return, and then increase by '300mg'$  and stay on that dose f/u 4 mos or sooner      Relevant Medications   amitriptyline (ELAVIL) 50 MG tablet   gabapentin (NEURONTIN) 300 MG capsule    Subjective:    Outpatient Medications Prior to Visit  Medication Sig Dispense Refill   acetaminophen (TYLENOL) 650 MG CR tablet Take 650 mg by mouth every 8 (eight) hours as needed for pain.     Cholecalciferol (VITAMIN D-3 PO) Take 7,000 Int'l Units by mouth daily.     estradiol (ESTRACE) 2 MG tablet Take 0.5 tablets (1 mg total) by mouth daily. 90 tablet 0   levothyroxine (SYNTHROID) 137 MCG tablet TAKE 1 TABLET BY MOUTH ONCE DAILY BEFORE BREAKFAST 30 tablet 0   losartan (COZAAR) 25 MG tablet TAKE 1 TABLET DAILY. TAKE  WITH THE 50-12.'5MG'$          LOSARTAN-HCTZ 90 tablet 1   losartan-hydrochlorothiazide (HYZAAR) 50-12.5 MG tablet TAKE 1 TABLET DAILY 90 tablet 1   naproxen (NAPROSYN) 500 MG tablet TAKE 1 TABLET TWICE DAILY  WITH MEALS 180 tablet 0   pantoprazole (PROTONIX) 40 MG tablet TAKE 1 TABLET TWICE A DAY 180 tablet 0  potassium chloride SA (KLOR-CON M20) 20 MEQ tablet Take 2 tablets (40 mEq total) by mouth every morning. 180 tablet 1   progesterone (PROMETRIUM) 200 MG capsule Take 1 capsule (200 mg total) by mouth at bedtime. 90 capsule 1   amitriptyline (ELAVIL) 25 MG tablet Take 1 tablet (25 mg total) by mouth at bedtime. 30 tablet 2   gabapentin (NEURONTIN) 300 MG capsule Take 1 capsule (300 mg total) by mouth 3 (three) times daily. 90 capsule 0   gabapentin (NEURONTIN) 600 MG tablet Take 1 tablet (600 mg total) by mouth 3 (three)  times daily. 270 tablet 3   No facility-administered medications prior to visit.   Past Medical History:  Diagnosis Date   Anxiety    Arthritis    low back   Chronic low back pain    Essential hypertension, benign 10/17/2019   External hemorrhoids with pain 02/28/2018   GERD (gastroesophageal reflux disease) 10/17/2019   Grade II internal hemorrhoids    History of adenomatous polyp of colon    11-06-2017   HLD (hyperlipidemia) 10/17/2019   Hot flashes due to menopause 10/17/2019   Rectal bleeding 11/02/2017   Vitamin D deficiency disease 10/17/2019   Wears glasses    Past Surgical History:  Procedure Laterality Date   BREAST BIOPSY Left 1970s   benign    COLONOSCOPY N/A 11/06/2017   Procedure: COLONOSCOPY;  Surgeon: Danie Binder, MD;  [facility];  Service: Endoscopy;  Laterality: N/A;  1:45pm   HEMORRHOID SURGERY N/A 02/28/2018   Procedure: HEMORRHOIDECTOMY;  Surgeon: Michael Boston, MD;  Location: [facility];  Service: General;  Laterality: N/A;   repair anal sphincter     SPHINCTEROTOMY N/A 02/28/2018   Procedure: LATERAL SPHINCTEROTOMY;  Surgeon: Michael Boston, MD;  Location: [facility];  Service: General;  Laterality: N/A;   Allergies  Allergen Reactions   Penicillins Shortness Of Breath and Itching    Has patient had a PCN reaction causing immediate rash, facial/tongue/throat swelling, SOB or lightheadedness with hypotension: Yes Has patient had a PCN reaction causing severe rash involving mucus membranes or skin necrosis: No Has patient had a PCN reaction that required hospitalization: No Has patient had a PCN reaction occurring within the last 10 years: No If all of the above answers are "NO", then may proceed with Cephalosporin use.    Codeine Itching      Objective:    Physical Exam Vitals and nursing note reviewed.  Constitutional:      Appearance: Normal appearance.  Cardiovascular:     Rate and Rhythm:  Normal rate and regular rhythm.  Pulmonary:     Effort: Pulmonary effort is normal.     Breath sounds: Normal breath sounds.  Musculoskeletal:        General: Normal range of motion.  Skin:    General: Skin is warm and dry.  Neurological:     Mental Status: She is alert.  Psychiatric:        Mood and Affect: Mood normal.        Behavior: Behavior normal.    BP 135/82 (BP Location: Right Arm, Patient Position: Sitting)   Pulse 86   Temp 97.8 F (36.6 C) (Temporal)   Ht '5\' 4"'$  (1.626 m)   Wt 183 lb (83 kg)   SpO2 96%   BMI 31.41 kg/m  Wt Readings from Last 3 Encounters:  10/17/22 183 lb (83 kg)  07/21/22 183 lb 4 oz (83.1 kg)  04/18/22 184 lb 8 oz (83.7 kg)       Jeanie Sewer, NP

## 2022-10-19 ENCOUNTER — Other Ambulatory Visit: Payer: Self-pay | Admitting: Family

## 2022-10-19 DIAGNOSIS — E039 Hypothyroidism, unspecified: Secondary | ICD-10-CM

## 2022-10-19 MED ORDER — LEVOTHYROXINE SODIUM 137 MCG PO TABS: 137.0000 ug | ORAL_TABLET | Freq: Every day | ORAL | 1 refills | Status: DC

## 2022-10-19 NOTE — Progress Notes (Signed)
Even though your TSH continues to be low, your actual thyroid level (T4) is perfect, so keep taking the same dose of Levothyroxine. I have [facility].

## 2022-10-21 ENCOUNTER — Ambulatory Visit: Payer: Medicare Other | Admitting: Family

## 2022-11-04 ENCOUNTER — Other Ambulatory Visit: Payer: Self-pay | Admitting: Family

## 2022-12-26 ENCOUNTER — Telehealth: Payer: Self-pay | Admitting: Family

## 2022-12-26 ENCOUNTER — Other Ambulatory Visit: Payer: Self-pay

## 2022-12-26 DIAGNOSIS — Z79890 Hormone replacement therapy (postmenopausal): Secondary | ICD-10-CM

## 2022-12-26 DIAGNOSIS — E039 Hypothyroidism, unspecified: Secondary | ICD-10-CM

## 2022-12-26 DIAGNOSIS — I10 Essential (primary) hypertension: Secondary | ICD-10-CM

## 2022-12-26 MED ORDER — ESTRADIOL 2 MG PO TABS: 1.0000 mg | ORAL_TABLET | Freq: Every day | ORAL | 0 refills | Status: DC

## 2022-12-26 MED ORDER — LEVOTHYROXINE SODIUM 137 MCG PO TABS: 137.0000 ug | ORAL_TABLET | Freq: Every day | ORAL | 1 refills | Status: DC

## 2022-12-26 MED ORDER — LOSARTAN POTASSIUM-HCTZ 50-12.5 MG PO TABS: 1.0000 | ORAL_TABLET | Freq: Every day | ORAL | 1 refills | Status: DC

## 2022-12-26 MED ORDER — LOSARTAN POTASSIUM 25 MG PO TABS: ORAL_TABLET | ORAL | 1 refills | Status: DC

## 2022-12-26 NOTE — Telephone Encounter (Signed)
Patient states her new  Preferred Pharmacy is [facility] ph# [phone].  Patient requests 90 Day Supply RX's for  the following medications:   LAST APPOINTMENT DATE:  10/17/22  NEXT APPOINTMENT DATE:  02/20/23  MEDICATION: levothyroxine (SYNTHROID) 137 MCG tablet  losartan (COZAAR) 25 MG tablet  losartan-hydrochlorothiazide (HYZAAR) 50-12.5 MG tablet  estradiol (ESTRACE) 2 MG tablet   Is the patient out of medication? No  PHARMACY:  [facility] ph# [phone].  Let patient know to contact pharmacy at the end of the day to make sure medication is ready.  Please notify patient to allow 48-72 hours to process

## 2023-01-26 ENCOUNTER — Telehealth: Payer: Self-pay | Admitting: Family

## 2023-01-26 NOTE — Telephone Encounter (Signed)
  LAST APPOINTMENT DATE:  10/17/22  NEXT APPOINTMENT DATE: 02/20/23  MEDICATION: gabapentin (NEURONTIN) 300 MG capsule   naproxen (NAPROSYN) 500 MG tablet   potassium chloride SA (KLOR-CON M20) 20 MEQ tablet   progesterone (PROMETRIUM) 200 MG capsule   Is the patient out of medication? Not yet   PHARMACY:  [place], [place] [address] [place] [address] Phone: [phone]  Fax: [phone]

## 2023-01-27 ENCOUNTER — Other Ambulatory Visit: Payer: Self-pay

## 2023-01-27 DIAGNOSIS — G609 Hereditary and idiopathic neuropathy, unspecified: Secondary | ICD-10-CM

## 2023-01-27 DIAGNOSIS — E876 Hypokalemia: Secondary | ICD-10-CM

## 2023-01-27 MED ORDER — POTASSIUM CHLORIDE CRYS ER 20 MEQ PO TBCR: 40.0000 meq | EXTENDED_RELEASE_TABLET | Freq: Every morning | ORAL | 1 refills | Status: DC

## 2023-01-27 MED ORDER — GABAPENTIN 300 MG PO CAPS: 600.0000 mg | ORAL_CAPSULE | Freq: Three times a day (TID) | ORAL | 3 refills | Status: DC

## 2023-01-27 MED ORDER — NAPROXEN 500 MG PO TABS: 500.0000 mg | ORAL_TABLET | Freq: Two times a day (BID) | ORAL | 0 refills | Status: DC

## 2023-01-27 NOTE — Telephone Encounter (Signed)
ok to send refills

## 2023-01-27 NOTE — Telephone Encounter (Signed)
Rx were sent to pharmacy

## 2023-01-30 ENCOUNTER — Other Ambulatory Visit: Payer: Self-pay

## 2023-01-30 DIAGNOSIS — G609 Hereditary and idiopathic neuropathy, unspecified: Secondary | ICD-10-CM

## 2023-01-30 DIAGNOSIS — Z79890 Hormone replacement therapy (postmenopausal): Secondary | ICD-10-CM

## 2023-01-30 DIAGNOSIS — E876 Hypokalemia: Secondary | ICD-10-CM

## 2023-01-30 MED ORDER — POTASSIUM CHLORIDE CRYS ER 20 MEQ PO TBCR: 40.0000 meq | EXTENDED_RELEASE_TABLET | Freq: Every morning | ORAL | 1 refills | Status: DC

## 2023-01-30 MED ORDER — NAPROXEN 500 MG PO TABS: 500.0000 mg | ORAL_TABLET | Freq: Two times a day (BID) | ORAL | 0 refills | Status: DC

## 2023-01-30 MED ORDER — GABAPENTIN 300 MG PO CAPS: 600.0000 mg | ORAL_CAPSULE | Freq: Three times a day (TID) | ORAL | 3 refills | Status: DC

## 2023-01-30 NOTE — Telephone Encounter (Signed)
Medications were meant to be sent to [facility], not  [facility]. Patient requests these be resent.

## 2023-01-31 ENCOUNTER — Other Ambulatory Visit: Payer: Self-pay

## 2023-01-31 DIAGNOSIS — Z79890 Hormone replacement therapy (postmenopausal): Secondary | ICD-10-CM

## 2023-01-31 MED ORDER — PROGESTERONE 200 MG PO CAPS: 200.0000 mg | ORAL_CAPSULE | Freq: Every evening | ORAL | 1 refills | Status: DC

## 2023-01-31 NOTE — Telephone Encounter (Signed)
I called and contacted pt to let her know all RX's were re sent to correct pharmacy. Pt gave a verbalized understanding.

## 2023-01-31 NOTE — Telephone Encounter (Signed)
yes, it is not controlled, thx

## 2023-02-20 ENCOUNTER — Encounter: Payer: Self-pay | Admitting: Family

## 2023-02-20 ENCOUNTER — Ambulatory Visit (INDEPENDENT_AMBULATORY_CARE_PROVIDER_SITE_OTHER): Payer: Medicare Other | Admitting: Family

## 2023-02-20 VITALS — BP 156/82 | HR 104 | Temp 98.0°F | Ht 64.0 in | Wt 181.2 lb

## 2023-02-20 DIAGNOSIS — E782 Mixed hyperlipidemia: Secondary | ICD-10-CM

## 2023-02-20 DIAGNOSIS — G609 Hereditary and idiopathic neuropathy, unspecified: Secondary | ICD-10-CM

## 2023-02-20 DIAGNOSIS — E876 Hypokalemia: Secondary | ICD-10-CM

## 2023-02-20 DIAGNOSIS — N399 Disorder of urinary system, unspecified: Secondary | ICD-10-CM

## 2023-02-20 DIAGNOSIS — Z79890 Hormone replacement therapy (postmenopausal): Secondary | ICD-10-CM

## 2023-02-20 DIAGNOSIS — I10 Essential (primary) hypertension: Secondary | ICD-10-CM | POA: Diagnosis not present

## 2023-02-20 MED ORDER — ESTRADIOL 1 MG PO TABS: 1.0000 mg | ORAL_TABLET | Freq: Every day | ORAL | 1 refills | Status: DC

## 2023-02-20 MED ORDER — AMITRIPTYLINE HCL 50 MG PO TABS: 50.0000 mg | ORAL_TABLET | Freq: Every day | ORAL | 3 refills | Status: DC

## 2023-02-20 NOTE — Assessment & Plan Note (Signed)
Chronic progesterone 200mg  qhs, and Estradiol 1mg  qd tolerating, no SE, no postmenopausal sx last mammogram 09/2022 - normal no refills needed today f/u 25yr.

## 2023-02-20 NOTE — Patient Instructions (Addendum)
It was very nice to see you today!   I will review your lab results via MyChart in a few days.       PLEASE NOTE:  If you had any lab tests please let us know if you have not heard back within a few days. You may see your results on MyChart before we have a chance to review them but we will give you a call once they are reviewed by us. If we ordered any referrals today, please let us know if you have not heard from their office within the next week.    

## 2023-02-20 NOTE — Assessment & Plan Note (Addendum)
chronic no meds, lifestyle modifications last LDL 01/2022 110; CVD risk 12.1% rechecking lipids today, if still high, will discuss statin f/u 6 mos

## 2023-02-20 NOTE — Progress Notes (Signed)
Patient ID: Michele Duncan, female    DOB: [birth year], 72 y.o.   MRN: [id number]  Chief Complaint  Patient presents with  . Hypothyroidism  . Hypertension    HPI: HRT:  Hormone Replacement Therapy: Patient presents to discuss hormone replacement therapy. Patient is requesting refills of hormone replacement therapy due to hot flashes, moodiness, no energy, personal history of heart disease, and vaginal dryness. The patient is currently taking hormone replacement therapy.  Patient denies post-menopausal vaginal bleeding. last pap and mammogram were normal. Patient is hormone deficient due to menopause, which occurred around age 54.  Current hormone therapy: Estrogen: 1mg  qd and Progesterone: 200mg  qd. pt is tolerating reduced dose of Progesterone. Neuropathy: She describes symptoms of numbness, burning, and tingling. Onset of symptoms was gradual, starting about several years ago. Symptoms are currently of moderate severity. Symptoms occur intermittently and last all day. The patient denies lancinating pain, squeezing, hypersensitivity, and allodynia. Symptoms are symmetric in both feet, but also reports internal perineal burning. Pt reports pain starting in her vaginal/perineal area and moves upward.  Previous treatment has included Gabapentin max dose of 3600mg  qd, which has not improved symptoms for her perineal area, but helps her feet. . Reports starting the Elavil, but no change in sx and no side effects, advised last time to increase to 25mg , pt still reports no improvement in perineal sx, but may be helping foot neuropathy. Hypertension: Patient is currently maintained on the following medications for blood pressure: Losartan-HCTZ Patient reports good compliance with blood pressure medications. Patient denies chest pain, headaches, shortness of breath or swelling. Hyperlipidemia: Patient is currently maintained on the following medication for hyperlipidemia: None. Patient reports good compliance  with low fat/low cholesterol diet.  Last lipid panel as follows: Lab Results  Component Value Date   CHOL 206 (H) 01/19/2022   HDL 66.70 01/19/2022   LDLCALC 110 (H) 01/19/2022   TRIG 147.0 01/19/2022   CHOLHDL 3 01/19/2022    Assessment & Plan:  Essential hypertension, benign Assessment & Plan: Chronic Taking Losartan 50mg -12.5mg  with 25mg  Losartan BP slightly up today, but her husband is now in Hospice and no expected to live much longer no refill needed today checking CMP f/u in 3 mos  Orders: -     Comprehensive metabolic panel  Idiopathic peripheral neuropathy -     Amitriptyline HCl; Take 1 tablet (50 mg total) by mouth at bedtime.  Dispense: 30 tablet; Refill: 3  Hormone replacement therapy (HRT) -     Estradiol; Take 1 tablet (1 mg total) by mouth daily.  Dispense: 90 tablet; Refill: 1  Mixed hyperlipidemia Assessment & Plan: chronic no meds, lifestyle modifications last LDL 01/2022 110; CVD risk 12.1% rechecking lipids today  Orders: -     Lipid panel -     Comprehensive metabolic panel  Hypokalemia due to loss of potassium -     Comprehensive metabolic panel  Urinary problem in female    Subjective:    Outpatient Medications Prior to Visit  Medication Sig Dispense Refill  . acetaminophen (TYLENOL) 650 MG CR tablet Take 650 mg by mouth every 8 (eight) hours as needed for pain.    . Cholecalciferol (VITAMIN D-3 PO) Take 7,000 Int'l Units by mouth daily.    Marland Kitchen gabapentin (NEURONTIN) 300 MG capsule Take 2 capsules (600 mg total) by mouth 3 (three) times daily. Reduce daily dose by 300mg  every week. If foot neuropathy returns, increase back daily dose by 300mg  and stay on this dose.  120 capsule 3  . levothyroxine (SYNTHROID) 137 MCG tablet Take 1 tablet (137 mcg total) by mouth daily before breakfast. 90 tablet 1  . losartan (COZAAR) 25 MG tablet TAKE 1 TABLET DAILY. TAKE  WITH THE 50-12.5MG          LOSARTAN-HCTZ 90 tablet 1  . losartan-hydrochlorothiazide  (HYZAAR) 50-12.5 MG tablet Take 1 tablet by mouth daily. 90 tablet 1  . naproxen (NAPROSYN) 500 MG tablet Take 1 tablet (500 mg total) by mouth 2 (two) times daily with a meal. 180 tablet 0  . pantoprazole (PROTONIX) 40 MG tablet TAKE 1 TABLET TWICE A DAY 180 tablet 0  . potassium chloride SA (KLOR-CON M20) 20 MEQ tablet Take 2 tablets (40 mEq total) by mouth every morning. 180 tablet 1  . progesterone (PROMETRIUM) 200 MG capsule Take 1 capsule (200 mg total) by mouth at bedtime. 90 capsule 1  . amitriptyline (ELAVIL) 50 MG tablet Take 1 tablet (50 mg total) by mouth at bedtime. 30 tablet 3  . estradiol (ESTRACE) 2 MG tablet Take 0.5 tablets (1 mg total) by mouth daily. 90 tablet 0   No facility-administered medications prior to visit.   Past Medical History:  Diagnosis Date  . Anxiety   . Arthritis    low back  . Chronic low back pain   . Chronic posterior anal fissure 02/28/2018  . Essential hypertension, benign 10/17/2019  . External hemorrhoids with pain 02/28/2018  . GERD (gastroesophageal reflux disease) 10/17/2019  . Grade II internal hemorrhoids   . History of adenomatous polyp of colon    11-06-2017  . HLD (hyperlipidemia) 10/17/2019  . Hot flashes due to menopause 10/17/2019  . Rectal bleeding 11/02/2017  . Vitamin D deficiency disease 10/17/2019  . Wears glasses    Past Surgical History:  Procedure Laterality Date  . BREAST BIOPSY Left 1970s   benign   . COLONOSCOPY N/A 11/06/2017   Procedure: COLONOSCOPY;  Surgeon: Danie Binder, MD;  [facility];  Service: Endoscopy;  Laterality: N/A;  1:45pm  . HEMORRHOID SURGERY N/A 02/28/2018   Procedure: HEMORRHOIDECTOMY;  Surgeon: Michael Boston, MD;  Location: [facility];  Service: General;  Laterality: N/A;  . repair anal sphincter    . SPHINCTEROTOMY N/A 02/28/2018   Procedure: LATERAL SPHINCTEROTOMY;  Surgeon: Michael Boston, MD;  Location: [facility];  Service: General;   Laterality: N/A;   Allergies  Allergen Reactions  . Penicillins Shortness Of Breath and Itching    Has patient had a PCN reaction causing immediate rash, facial/tongue/throat swelling, SOB or lightheadedness with hypotension: Yes Has patient had a PCN reaction causing severe rash involving mucus membranes or skin necrosis: No Has patient had a PCN reaction that required hospitalization: No Has patient had a PCN reaction occurring within the last 10 years: No If all of the above answers are "NO", then may proceed with Cephalosporin use.   . Codeine Itching      Objective:    Physical Exam Vitals and nursing note reviewed.  Constitutional:      Appearance: Normal appearance.  Cardiovascular:     Rate and Rhythm: Normal rate and regular rhythm.  Pulmonary:     Effort: Pulmonary effort is normal.     Breath sounds: Normal breath sounds.  Musculoskeletal:        General: Normal range of motion.  Skin:    General: Skin is warm and dry.  Neurological:     Mental Status: She is alert.  Psychiatric:        Mood and Affect: Mood normal.        Behavior: Behavior normal.   BP (!) 156/82 (BP Location: Left Arm, Patient Position: Sitting, Cuff Size: Large)   Pulse (!) 104   Temp 98 F (36.7 C) (Temporal)   Ht 5\' 4"  (1.626 m)   Wt 181 lb 4 oz (82.2 kg)   SpO2 98%   BMI 31.11 kg/m  Wt Readings from Last 3 Encounters:  02/20/23 181 lb 4 oz (82.2 kg)  10/17/22 183 lb (83 kg)  07/21/22 183 lb 4 oz (83.1 kg)      Jeanie Sewer, NP

## 2023-02-20 NOTE — Assessment & Plan Note (Addendum)
Chronic Taking Losartan 50mg -12.5mg  with 25mg  Losartan BP slightly up today, but her husband is now in Hospice and no expected to live much longer no refill needed today checking CMP f/u in 3 mos

## 2023-02-21 DIAGNOSIS — N399 Disorder of urinary system, unspecified: Secondary | ICD-10-CM | POA: Insufficient documentation

## 2023-02-21 LAB — COMPREHENSIVE METABOLIC PANEL
ALT: 10 U/L (ref 0–35)
AST: 11 U/L (ref 0–37)
Albumin: 3.9 g/dL (ref 3.5–5.2)
Alkaline Phosphatase: 67 U/L (ref 39–117)
BUN: 14 mg/dL (ref 6–23)
CO2: 25 mEq/L (ref 19–32)
Calcium: 9.2 mg/dL (ref 8.4–10.5)
Chloride: 100 mEq/L (ref 96–112)
Creatinine, Ser: 0.79 mg/dL (ref 0.40–1.20)
GFR: 76.16 mL/min (ref >60.00)
Glucose, Bld: 95 mg/dL (ref 70–99)
Potassium: 3.4 mEq/L — ABNORMAL LOW (ref 3.5–5.1)
Sodium: 137 mEq/L (ref 135–145)
Total Bilirubin: 0.4 mg/dL (ref 0.2–1.2)
Total Protein: 6.4 g/dL (ref 6.0–8.3)

## 2023-02-21 LAB — LIPID PANEL
Cholesterol: 204 mg/dL — ABNORMAL HIGH (ref 0–200)
HDL: 72.6 mg/dL (ref >39.00)
LDL Cholesterol: 98 mg/dL (ref 0–99)
NonHDL: 131.65
Total CHOL/HDL Ratio: 3
Triglycerides: 169 mg/dL — ABNORMAL HIGH (ref 0.0–149.0)
VLDL: 33.8 mg/dL (ref 0.0–40.0)

## 2023-02-21 NOTE — Assessment & Plan Note (Signed)
chronic stable on Elavil  50mg  qhs pt did not wean off Gabapentin 600mg  tid no refill needed today f/u 4 mos or sooner

## 2023-02-21 NOTE — Assessment & Plan Note (Signed)
New discussed exercises vs medications pt would like to try pelvic floor and kegal exercises on her own first. reviewed exercises and provided handouts  can also refer to PT for further help if having difficulty performing exercises at home. f/u prn

## 2023-03-01 DIAGNOSIS — H25813 Combined forms of age-related cataract, bilateral: Secondary | ICD-10-CM | POA: Diagnosis not present

## 2023-03-01 DIAGNOSIS — H04123 Dry eye syndrome of bilateral lacrimal glands: Secondary | ICD-10-CM | POA: Diagnosis not present

## 2023-03-09 ENCOUNTER — Other Ambulatory Visit: Payer: Self-pay | Admitting: Family

## 2023-03-09 DIAGNOSIS — Z79890 Hormone replacement therapy (postmenopausal): Secondary | ICD-10-CM

## 2023-03-09 DIAGNOSIS — I10 Essential (primary) hypertension: Secondary | ICD-10-CM

## 2023-03-09 DIAGNOSIS — E039 Hypothyroidism, unspecified: Secondary | ICD-10-CM

## 2023-03-09 NOTE — Telephone Encounter (Signed)
Please advise if patient should be on both losartan potassium and hyzaar

## 2023-03-10 NOTE — Telephone Encounter (Signed)
yes, thanks

## 2023-04-02 ENCOUNTER — Other Ambulatory Visit: Payer: Self-pay | Admitting: Family

## 2023-04-09 ENCOUNTER — Other Ambulatory Visit: Payer: Self-pay | Admitting: Family

## 2023-04-09 DIAGNOSIS — E876 Hypokalemia: Secondary | ICD-10-CM

## 2023-04-10 ENCOUNTER — Other Ambulatory Visit: Payer: Self-pay | Admitting: Family

## 2023-04-10 DIAGNOSIS — Z79890 Hormone replacement therapy (postmenopausal): Secondary | ICD-10-CM

## 2023-05-18 ENCOUNTER — Other Ambulatory Visit: Payer: Self-pay | Admitting: Family

## 2023-05-18 DIAGNOSIS — G609 Hereditary and idiopathic neuropathy, unspecified: Secondary | ICD-10-CM

## 2023-05-24 ENCOUNTER — Other Ambulatory Visit: Payer: Self-pay

## 2023-05-24 ENCOUNTER — Telehealth: Payer: Self-pay | Admitting: Family

## 2023-05-24 DIAGNOSIS — K219 Gastro-esophageal reflux disease without esophagitis: Secondary | ICD-10-CM

## 2023-05-24 MED ORDER — PANTOPRAZOLE SODIUM 40 MG PO TBEC: 40.0000 mg | DELAYED_RELEASE_TABLET | Freq: Two times a day (BID) | ORAL | 0 refills | Status: DC

## 2023-05-24 NOTE — Telephone Encounter (Signed)
Prescription Request  05/24/2023  LOV: 02/20/2023  What is the name of the medication or equipment?  pantoprazole (PROTONIX) 40 MG tablet   Have you contacted your pharmacy to request a refill? Yes   Which pharmacy would you like this sent to? [facility] - [place], [place] - [address] [place] [address] Phone: [phone] Fax: [phone]   Patient notified that their request is being sent to the clinical staff for review and that they should receive a response within 2 business days.   Please advise at Mobile [phone] (mobile)

## 2023-05-24 NOTE — Telephone Encounter (Signed)
Rx sent 

## 2023-06-22 ENCOUNTER — Other Ambulatory Visit: Payer: Self-pay | Admitting: Family

## 2023-06-22 DIAGNOSIS — G609 Hereditary and idiopathic neuropathy, unspecified: Secondary | ICD-10-CM

## 2023-06-28 ENCOUNTER — Telehealth: Payer: Self-pay | Admitting: Family

## 2023-06-28 NOTE — Telephone Encounter (Signed)
Patient would like someone to call her regarding her amitriptyline 50 mg .. She is needing a refill sent to [facility]ar [phone] which is on file.    She said she only has 1 pill left.  Thank you  Gabriel Cirri [facility] TEAM Direct Dial [phone]

## 2023-06-29 ENCOUNTER — Other Ambulatory Visit: Payer: Self-pay | Admitting: Family

## 2023-06-29 DIAGNOSIS — G609 Hereditary and idiopathic neuropathy, unspecified: Secondary | ICD-10-CM

## 2023-06-30 ENCOUNTER — Other Ambulatory Visit: Payer: Self-pay

## 2023-06-30 DIAGNOSIS — G609 Hereditary and idiopathic neuropathy, unspecified: Secondary | ICD-10-CM

## 2023-06-30 MED ORDER — AMITRIPTYLINE HCL 50 MG PO TABS
50.0000 mg | ORAL_TABLET | Freq: Every day | ORAL | 1 refills | Status: DC
Start: 2023-06-30 — End: 2023-12-19

## 2023-06-30 NOTE — Telephone Encounter (Signed)
ok to refill, 90d supply with 1 refill, thanks.

## 2023-06-30 NOTE — Telephone Encounter (Signed)
RX sent

## 2023-07-25 ENCOUNTER — Other Ambulatory Visit: Payer: Self-pay | Admitting: Family

## 2023-07-25 DIAGNOSIS — K219 Gastro-esophageal reflux disease without esophagitis: Secondary | ICD-10-CM

## 2023-08-22 ENCOUNTER — Other Ambulatory Visit (HOSPITAL_COMMUNITY): Payer: Self-pay | Admitting: Family

## 2023-08-22 DIAGNOSIS — Z1231 Encounter for screening mammogram for malignant neoplasm of breast: Secondary | ICD-10-CM

## 2023-08-29 ENCOUNTER — Telehealth: Payer: Self-pay | Admitting: Family

## 2023-08-29 NOTE — Telephone Encounter (Signed)
Caller was Maralyn Sago, Scientist, research (life sciences) called for approval of changing the manufacturer of patient's levothyroxine from Amneal to Lupin. Caller can be reached at [phone] to give approval. Reference number is [id number].

## 2023-08-30 NOTE — Telephone Encounter (Signed)
I returned call, Approved change of manufacturer.

## 2023-09-18 ENCOUNTER — Ambulatory Visit: Payer: Medicare Other

## 2023-09-18 VITALS — Wt 181.0 lb

## 2023-09-18 DIAGNOSIS — Z0000 Encounter for general adult medical examination without abnormal findings: Secondary | ICD-10-CM

## 2023-09-18 NOTE — Patient Instructions (Signed)
Michele Duncan , Thank you for taking time to come for your Medicare Wellness Visit. I appreciate your ongoing commitment to your health goals. Please review the following plan we discussed and let me know if I can assist you in the future.   Referrals/Orders/Follow-Ups/Clinician Recommendations: Each day, aim for 6 glasses of water, plenty of protein in your diet and try to get up and walk/ stretch every hour for 5-10 minutes at a time.  Aim for 30 minutes of exercise or brisk walking, 6-8 glasses of water, and 5 servings of fruits and vegetables each day.   This is a list of the screening recommended for you and due dates:  Health Maintenance  Topic Date Due   Zoster (Shingles) Vaccine (1 of 2) Never done   COVID-19 Vaccine (4 - 2023-24 season) 09/29/2023*   Medicare Annual Wellness Visit  09/17/2024   Mammogram  10/03/2024   Colon Cancer Screening  11/07/2027   DTaP/Tdap/Td vaccine (2 - Td or Tdap) 10/16/2029   Pneumonia Vaccine  Completed   Flu Shot  Completed   DEXA scan (bone density measurement)  Completed   Hepatitis C Screening  Completed   HPV Vaccine  Aged Out  *Topic was postponed. The date shown is not the original due date.    Advanced directives: (Copy Requested) Please bring a copy of your health care power of attorney and living will to the office to be added to your chart at your convenience.  Next Medicare Annual Wellness Visit scheduled for next year: Yes

## 2023-09-18 NOTE — Progress Notes (Signed)
Subjective:   Michele Duncan is a 72 y.o. female who presents for Medicare Annual (Subsequent) preventive examination.  Visit Complete: Virtual I connected with  Ozzie Hoyle on 09/18/23 by a audio enabled telemedicine application and verified that I am speaking with the correct person using two identifiers.  Patient Location: Home  Provider Location: Office/Clinic  I discussed the limitations of evaluation and management by telemedicine. The patient expressed understanding and agreed to proceed.  Vital Signs: Because this visit was a virtual/telehealth visit, some criteria may be missing or patient reported. Any vitals not documented were not able to be obtained and vitals that have been documented are patient reported.   Because this visit was a virtual/telehealth visit, some criteria may be missing or patient reported. Any vitals not documented were not able to be obtained and vitals that have been documented are patient reported.    Cardiac Risk Factors include: advanced age (>18men, >78 women);dyslipidemia;hypertension;obesity (BMI >30kg/m2)     Objective:    Today's Vitals   09/18/23 1529  Weight: 181 lb (82.1 kg)   Body mass index is 31.07 kg/m.     09/18/2023    3:33 PM 05/30/2022   11:35 AM 05/12/2021   12:32 AM 02/19/2020    2:04 PM 01/21/2020    2:07 PM 02/28/2018    7:02 AM 11/06/2017    1:16 PM  Advanced Directives  Does Patient Have a Medical Advance Directive? Yes Yes No No Yes Yes Yes  Type of Estate agent of [place];Living will Healthcare Power of Attorney   Living will;Healthcare Power of Attorney Living will Healthcare Power of [place];Living will  Does patient want to make changes to medical advance directive?     No - Patient declined No - Patient declined   Copy of Healthcare Power of Attorney in Chart? No - copy requested No - copy requested   Yes - validated most recent copy scanned in chart (See row information)  No - copy  requested  Would patient like information on creating a medical advance directive?    No - Patient declined       Current Medications (verified) Outpatient Encounter Medications as of 09/18/2023  Medication Sig   acetaminophen (TYLENOL) 650 MG CR tablet Take 650 mg by mouth every 8 (eight) hours as needed for pain.   amitriptyline (ELAVIL) 50 MG tablet Take 1 tablet (50 mg total) by mouth at bedtime.   Cholecalciferol (VITAMIN D-3 PO) Take 7,000 Int'l Units by mouth daily.   estradiol (ESTRACE) 1 MG tablet Take 1 tablet (1 mg total) by mouth daily.   gabapentin (NEURONTIN) 300 MG capsule TAKE 2 CAPS THREE TIMES DAILY.  REDUCE DAILY DOSE BY 300MG  EVERY WEEK. IF FOOT NEUROPATHY  RETURNS, INCREASE BACK DAILY  DOSE BY 300MG  &amp;STAY ON THIS DOSE   levothyroxine (SYNTHROID) 137 MCG tablet TAKE 1 TABLET BY MOUTH DAILY  BEFORE BREAKFAST   losartan (COZAAR) 25 MG tablet TAKE 1 TABLET BY MOUTH DAILY  TAKE WITH LOSARTAN/HCTZ TAB  50-12.5MG    losartan-hydrochlorothiazide (HYZAAR) 50-12.5 MG tablet TAKE 1 TABLET BY MOUTH DAILY   naproxen (NAPROSYN) 500 MG tablet TAKE 1 TABLET BY MOUTH TWICE  DAILY WITH A MEAL   pantoprazole (PROTONIX) 40 MG tablet TAKE 1 TABLET BY MOUTH TWICE  DAILY   potassium chloride SA (KLOR-CON M) 20 MEQ tablet TAKE 2 TABLETS BY MOUTH IN THE  MORNING   progesterone (PROMETRIUM) 200 MG capsule TAKE 1 CAPSULE BY MOUTH AT  BEDTIME   [DISCONTINUED] estradiol (ESTRACE) 2 MG tablet TAKE ONE-HALF TABLET BY MOUTH  (1MG ) DAILY   No facility-administered encounter medications on file as of 09/18/2023.    Allergies (verified) Penicillins and Codeine   History: Past Medical History:  Diagnosis Date   Anxiety    Arthritis    low back   Chronic low back pain    Chronic posterior anal fissure 02/28/2018   Essential hypertension, benign 10/17/2019   External hemorrhoids with pain 02/28/2018   GERD (gastroesophageal reflux disease) 10/17/2019   Grade II internal hemorrhoids     History of adenomatous polyp of colon    11-06-2017   HLD (hyperlipidemia) 10/17/2019   Hot flashes due to menopause 10/17/2019   Rectal bleeding 11/02/2017   Vitamin D deficiency disease 10/17/2019   Wears glasses    Past Surgical History:  Procedure Laterality Date   BREAST BIOPSY Left 1970s   benign    COLONOSCOPY N/A 11/06/2017   Procedure: COLONOSCOPY;  Surgeon: West Bali, MD;  [facility];  Service: Endoscopy;  Laterality: N/A;  1:45pm   HEMORRHOID SURGERY N/A 02/28/2018   Procedure: HEMORRHOIDECTOMY;  Surgeon: Karie Soda, MD;  [facility];  Service: General;  Laterality: N/A;   repair anal sphincter     SPHINCTEROTOMY N/A 02/28/2018   Procedure: LATERAL SPHINCTEROTOMY;  Surgeon: Karie Soda, MD;  Location: [facility];  Service: General;  Laterality: N/A;   Family History  Problem Relation Age of Onset   Colon cancer Paternal Grandmother    Breast cancer Mother    Hypertension Father    Heart disease Father    Autoimmune disease Sister    Lung disease Sister    Breast cancer Maternal Aunt    Breast cancer Maternal Grandmother    Colon polyps Neg Hx    Social History   Socioeconomic History   Marital status: Married    Spouse name: Clovis Riley   Number of children: Not on file   Years of education: 2   Highest education level: High school graduate  Occupational History   Not on file  Tobacco Use   Smoking status: Former    Current packs/day: 0.00    Types: Cigarettes    Quit date: 12/05/2013    Years since quitting: 9.7   Smokeless tobacco: Never  Vaping Use   Vaping status: Never Used  Substance and Sexual Activity   Alcohol use: No   Drug use: Not Currently   Sexual activity: Yes  Other Topics Concern   Not on file  Social History Narrative   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   Lives with husband   Right handed   Caffeine: 3 cups of coffee   Social  Determinants of Health   Financial Resource Strain: Low Risk  (09/18/2023)   Overall Financial Resource Strain (CARDIA)    Difficulty of Paying Living Expenses: Not hard at all  Food Insecurity: No Food Insecurity (09/18/2023)   Hunger Vital Sign    Worried About Running Out of Food in the Last Year: Never true    Ran Out of Food in the Last Year: Never true  Transportation Needs: No Transportation Needs (09/18/2023)   PRAPARE - Administrator, Civil Service (Medical): No    Lack of Transportation (Non-Medical): No  Physical Activity: Inactive (09/18/2023)   Exercise Vital Sign    Days of Exercise per Week: 0 days    Minutes of  Exercise per Session: 0 min  Stress: No Stress Concern Present (09/18/2023)   [employer] of Occupational Health - Occupational Stress Questionnaire    Feeling of Stress : Only a little  Social Connections: Socially Isolated (09/18/2023)   Social Connection and Isolation Panel [NHANES]    Frequency of Communication with Friends and Family: More than three times a week    Frequency of Social Gatherings with Friends and Family: More than three times a week    Attends Religious Services: Never    Database administrator or Organizations: No    Attends Banker Meetings: Never    Marital Status: Widowed    Tobacco Counseling Counseling given: Not Answered   Clinical Intake:  Pre-visit preparation completed: Yes  Pain : No/denies pain     BMI - recorded: 31.07 Nutritional Status: BMI > 30  Obese Nutritional Risks: None Diabetes: No  How often do you need to have someone help you when you read instructions, pamphlets, or other written materials from your doctor or pharmacy?: 1 - Never  Interpreter Needed?: No  Information entered by :: Lanier Ensign, LPN   Activities of Daily Living    09/18/2023    3:31 PM  In your present state of health, do you have any difficulty performing the following activities:   Hearing? 0  Vision? 0  Difficulty concentrating or making decisions? 0  Walking or climbing stairs? 0  Dressing or bathing? 0  Doing errands, shopping? 0  Preparing Food and eating ? N  Using the Toilet? N  In the past six months, have you accidently leaked urine? N  Do you have problems with loss of bowel control? N  Managing your Medications? N  Managing your Finances? N  Housekeeping or managing your Housekeeping? N    Patient Care Team: Dulce Sellar, NP as PCP - General (Family Medicine) Corky Crafts, MD as PCP - Cardiology (Cardiology) West Bali, MD (Inactive) as Consulting Physician (Gastroenterology) Karie Soda, MD as Consulting Physician (General Surgery) Lanelle Bal, DO as Consulting Physician (Internal Medicine)  Indicate any recent Medical Services you may have received from other than [facility] providers in the past year (date may be approximate).     Assessment:   This is a routine wellness examination for [place].  Hearing/Vision screen Hearing Screening - Comments:: Pt denies any hearing issues  Vision Screening - Comments:: Pt follows up with Dr Dione Booze for annual eye exams    Goals Addressed   None    Depression Screen    09/18/2023    3:32 PM 05/30/2022   11:34 AM 10/15/2021   12:55 PM 10/19/2020    4:35 PM 01/21/2020    2:08 PM  PHQ 2/9 Scores  PHQ - 2 Score 1 0 0 0 0  PHQ- 9 Score    0     Fall Risk    09/18/2023    3:34 PM 10/17/2022    8:21 AM 05/30/2022   11:36 AM 01/19/2022    3:59 PM 10/15/2021   12:55 PM  Fall Risk   Falls in the past year? 0 0 0 0 0  Number falls in past yr: 0 0 0    Injury with Fall? 0 0 0    Risk for fall due to : Impaired vision No Fall Risks Impaired vision No Fall Risks No Fall Risks  Follow up Falls prevention discussed Falls evaluation completed Falls prevention discussed      MEDICARE RISK  AT HOME: Medicare Risk at Home Any stairs in or around the home?: Yes If so, are there any  without handrails?: No Home free of loose throw rugs in walkways, pet beds, electrical cords, etc?: Yes Adequate lighting in your home to reduce risk of falls?: Yes Life alert?: No Use of a cane, walker or w/c?: No Grab bars in the bathroom?: No Shower chair or bench in shower?: No Elevated toilet seat or a handicapped toilet?: No  TIMED UP AND GO:  Was the test performed?  No    Cognitive Function:        09/18/2023    3:34 PM 01/21/2020    2:10 PM  6CIT Screen  What Year? 0 points 0 points  What month? 0 points 0 points  What time? 0 points 3 points  Count back from 20 0 points 0 points  Months in reverse 0 points 0 points  Repeat phrase 0 points 0 points  Total Score 0 points 3 points    Immunizations Immunization History  Administered Date(s) Administered   Fluad Quad(high Dose 65+) 09/24/2019, 09/09/2020, 09/07/2021, 09/19/2022, 09/11/2023   PFIZER(Purple Top)SARS-COV-2 Vaccination 01/14/2020, 02/08/2020, 12/10/2020   PNEUMOCOCCAL CONJUGATE-20 09/11/2023   Pneumococcal Conjugate-13 10/17/2019   Tdap 10/17/2019    TDAP status: Up to date  Flu Vaccine status: Up to date  Pneumococcal vaccine status: Up to date  Covid-19 vaccine status: Information provided on how to obtain vaccines.   Qualifies for Shingles Vaccine? Yes   Zostavax completed No   Shingrix Completed?: No.    Education has been provided regarding the importance of this vaccine. Patient has been advised to call insurance company to determine out of pocket expense if they have not yet received this vaccine. Advised may also receive vaccine at local pharmacy or Health Dept. Verbalized acceptance and understanding.  Screening Tests Health Maintenance  Topic Date Due   Zoster Vaccines- Shingrix (1 of 2) Never done   COVID-19 Vaccine (4 - 2023-24 season) 09/29/2023 (Originally 08/06/2023)   Medicare Annual Wellness (AWV)  09/17/2024   MAMMOGRAM  10/03/2024   Colonoscopy  11/07/2027   DTaP/Tdap/Td (2  - Td or Tdap) 10/16/2029   Pneumonia Vaccine 15+ Years old  Completed   INFLUENZA VACCINE  Completed   DEXA SCAN  Completed   Hepatitis C Screening  Completed   HPV VACCINES  Aged Out    Health Maintenance  Health Maintenance Due  Topic Date Due   Zoster Vaccines- Shingrix (1 of 2) Never done    Colorectal cancer screening: Type of screening: Colonoscopy. Completed 11/06/17. Repeat every 10 years  Mammogram status: Ordered Scheduled 10/17/23. Pt provided with contact info and advised to call to schedule appt.   Bone Density status: Completed 11/16/17. Results reflect: Bone density results: NORMAL. Repeat every 2 years.  Additional Screening:  Hepatitis C Screening:  Completed 10/17/19  Vision Screening: Recommended annual ophthalmology exams for early detection of glaucoma and other disorders of the eye. Is the patient up to date with their annual eye exam?  Yes  Who is the provider or what is the name of the office in which the patient attends annual eye exams? Dr Dione Booze  If pt is not established with a provider, would they like to be referred to a provider to establish care? No .   Dental Screening: Recommended annual dental exams for proper oral hygiene   Community Resource Referral / Chronic Care Management: CRR required this visit?  No   CCM required this  visit?  No     Plan:     I have personally reviewed and noted the following in the patient's chart:   Medical and social history Use of alcohol, tobacco or illicit drugs  Current medications and supplements including opioid prescriptions. Patient is not currently taking opioid prescriptions. Functional ability and status Nutritional status Physical activity Advanced directives List of other physicians Hospitalizations, surgeries, and ER visits in previous 12 months Vitals Screenings to include cognitive, depression, and falls Referrals and appointments  In addition, I have reviewed and discussed with  patient certain preventive protocols, quality metrics, and best practice recommendations. A written personalized care plan for preventive services as well as general preventive health recommendations were provided to patient.     Marzella Schlein, LPN   [id number]   After Visit Summary: (MyChart) Due to this being a telephonic visit, the after visit summary with patients personalized plan was offered to patient via MyChart   Nurse Notes: none

## 2023-10-06 ENCOUNTER — Ambulatory Visit (HOSPITAL_COMMUNITY)
Admission: RE | Admit: 2023-10-06 | Discharge: 2023-10-06 | Disposition: A | Discharge status: Home / Self Care | Payer: Medicare Other | Source: Ambulatory Visit | Attending: Family | Admitting: Family

## 2023-10-06 DIAGNOSIS — Z1231 Encounter for screening mammogram for malignant neoplasm of breast: Secondary | ICD-10-CM | POA: Insufficient documentation

## 2023-11-02 ENCOUNTER — Other Ambulatory Visit: Payer: Self-pay | Admitting: Family

## 2023-11-02 DIAGNOSIS — I10 Essential (primary) hypertension: Secondary | ICD-10-CM

## 2023-11-02 DIAGNOSIS — Z79890 Hormone replacement therapy (postmenopausal): Secondary | ICD-10-CM

## 2023-11-09 ENCOUNTER — Other Ambulatory Visit (HOSPITAL_COMMUNITY): Payer: Self-pay | Admitting: Obstetrics & Gynecology

## 2023-11-09 DIAGNOSIS — Z13820 Encounter for screening for osteoporosis: Secondary | ICD-10-CM

## 2023-11-14 ENCOUNTER — Ambulatory Visit (HOSPITAL_COMMUNITY)
Admission: RE | Admit: 2023-11-14 | Discharge: 2023-11-14 | Disposition: A | Discharge status: Home / Self Care | Payer: Medicare Other | Source: Ambulatory Visit | Attending: Obstetrics & Gynecology | Admitting: Obstetrics & Gynecology

## 2023-11-14 DIAGNOSIS — Z780 Asymptomatic menopausal state: Secondary | ICD-10-CM | POA: Diagnosis not present

## 2023-11-14 DIAGNOSIS — Z13820 Encounter for screening for osteoporosis: Secondary | ICD-10-CM | POA: Diagnosis not present

## 2023-11-16 ENCOUNTER — Other Ambulatory Visit: Payer: Self-pay | Admitting: Family

## 2023-12-08 ENCOUNTER — Other Ambulatory Visit: Payer: Self-pay | Admitting: Family

## 2023-12-08 DIAGNOSIS — E876 Hypokalemia: Secondary | ICD-10-CM

## 2023-12-16 ENCOUNTER — Other Ambulatory Visit: Payer: Self-pay | Admitting: Family

## 2023-12-16 DIAGNOSIS — G609 Hereditary and idiopathic neuropathy, unspecified: Secondary | ICD-10-CM

## 2023-12-19 ENCOUNTER — Other Ambulatory Visit: Payer: Self-pay | Admitting: Family

## 2023-12-19 DIAGNOSIS — G609 Hereditary and idiopathic neuropathy, unspecified: Secondary | ICD-10-CM

## 2023-12-25 ENCOUNTER — Telehealth: Payer: Self-pay | Admitting: Family

## 2023-12-25 DIAGNOSIS — G609 Hereditary and idiopathic neuropathy, unspecified: Secondary | ICD-10-CM

## 2023-12-25 MED ORDER — AMITRIPTYLINE HCL 50 MG PO TABS: 50.0000 mg | ORAL_TABLET | Freq: Every day | ORAL | 0 refills | Status: DC

## 2023-12-25 NOTE — Telephone Encounter (Signed)
Copied from CRM [id number]. Topic: Clinical - Prescription Issue >> Dec 25, 2023  9:47 AM Dennison Nancy wrote: Reason for CRM: patient picked up her refill on saturday 12/23/23 the issue is  patient grandchild 71 year old dropped her medication bottle amitriptyline (ELAVIL) 50 MG tablet  in her dishwater,  and the water seeped in the bottle and ruin the medication  patient contacted the pharmacy and pharmacy told patient to reach out to her provider to get a early refill  [id number] - [place], [place] - [address] [place] [address] Phone: [phone] Fax: [phone] Hours: Not open 24 hours

## 2023-12-25 NOTE — Telephone Encounter (Signed)
sent 30d supply - pt should not have been given 90 pills - overdue for office visit, not seen since last March, schedule pt for f/u, thx

## 2023-12-25 NOTE — Addendum Note (Signed)
Addended byDulce Sellar on: 12/25/2023 05:22 PM   Modules accepted: Orders

## 2023-12-28 ENCOUNTER — Ambulatory Visit (INDEPENDENT_AMBULATORY_CARE_PROVIDER_SITE_OTHER): Payer: Medicare Other | Admitting: Family

## 2023-12-28 ENCOUNTER — Encounter: Payer: Self-pay | Admitting: Family

## 2023-12-28 VITALS — BP 130/82 | HR 68 | Temp 97.3°F | Ht 64.0 in | Wt 188.8 lb

## 2023-12-28 DIAGNOSIS — E782 Mixed hyperlipidemia: Secondary | ICD-10-CM | POA: Diagnosis not present

## 2023-12-28 DIAGNOSIS — K219 Gastro-esophageal reflux disease without esophagitis: Secondary | ICD-10-CM

## 2023-12-28 DIAGNOSIS — E876 Hypokalemia: Secondary | ICD-10-CM

## 2023-12-28 DIAGNOSIS — G609 Hereditary and idiopathic neuropathy, unspecified: Secondary | ICD-10-CM

## 2023-12-28 DIAGNOSIS — E559 Vitamin D deficiency, unspecified: Secondary | ICD-10-CM

## 2023-12-28 DIAGNOSIS — Z79890 Hormone replacement therapy (postmenopausal): Secondary | ICD-10-CM

## 2023-12-28 DIAGNOSIS — E039 Hypothyroidism, unspecified: Secondary | ICD-10-CM

## 2023-12-28 DIAGNOSIS — I10 Essential (primary) hypertension: Secondary | ICD-10-CM

## 2023-12-28 DIAGNOSIS — E669 Obesity, unspecified: Secondary | ICD-10-CM

## 2023-12-28 DIAGNOSIS — Z6832 Body mass index (BMI) 32.0-32.9, adult: Secondary | ICD-10-CM

## 2023-12-28 LAB — COMPREHENSIVE METABOLIC PANEL
ALT: 10 U/L (ref 0–35)
AST: 14 U/L (ref 0–37)
Albumin: 4.2 g/dL (ref 3.5–5.2)
Alkaline Phosphatase: 54 U/L (ref 39–117)
BUN: 14 mg/dL (ref 6–23)
CO2: 26 meq/L (ref 19–32)
Calcium: 9.1 mg/dL (ref 8.4–10.5)
Chloride: 101 meq/L (ref 96–112)
Creatinine, Ser: 0.83 mg/dL (ref 0.40–1.20)
GFR: 71.35 mL/min (ref >60.00)
Glucose, Bld: 95 mg/dL (ref 70–99)
Potassium: 3.5 meq/L (ref 3.5–5.1)
Sodium: 138 meq/L (ref 135–145)
Total Bilirubin: 0.3 mg/dL (ref 0.2–1.2)
Total Protein: 6.7 g/dL (ref 6.0–8.3)

## 2023-12-28 LAB — TSH: TSH: 0.02 u[IU]/mL — ABNORMAL LOW (ref 0.35–5.50)

## 2023-12-28 LAB — LIPID PANEL
Cholesterol: 215 mg/dL — ABNORMAL HIGH (ref 0–200)
HDL: 78.3 mg/dL (ref >39.00)
LDL Cholesterol: 105 mg/dL — ABNORMAL HIGH (ref 0–99)
NonHDL: 136.31
Total CHOL/HDL Ratio: 3
Triglycerides: 155 mg/dL — ABNORMAL HIGH (ref 0.0–149.0)
VLDL: 31 mg/dL (ref 0.0–40.0)

## 2023-12-28 LAB — T4, FREE: Free T4: 1.36 ng/dL (ref 0.60–1.60)

## 2023-12-28 LAB — VITAMIN D 25 HYDROXY (VIT D DEFICIENCY, FRACTURES): VITD: 64.63 ng/mL (ref 30.00–100.00)

## 2023-12-28 MED ORDER — PANTOPRAZOLE SODIUM 40 MG PO TBEC: 40.0000 mg | DELAYED_RELEASE_TABLET | Freq: Two times a day (BID) | ORAL | 3 refills | Status: DC

## 2023-12-28 MED ORDER — POTASSIUM CHLORIDE CRYS ER 20 MEQ PO TBCR: 20.0000 meq | EXTENDED_RELEASE_TABLET | Freq: Once | ORAL | 2 refills | Status: DC

## 2023-12-28 MED ORDER — AMITRIPTYLINE HCL 50 MG PO TABS: 50.0000 mg | ORAL_TABLET | Freq: Every day | ORAL | 0 refills | Status: DC

## 2023-12-28 MED ORDER — ESTRADIOL 1 MG PO TABS: 1.0000 mg | ORAL_TABLET | Freq: Every day | ORAL | 2 refills | Status: DC

## 2023-12-28 MED ORDER — GABAPENTIN 300 MG PO CAPS: 900.0000 mg | ORAL_CAPSULE | Freq: Three times a day (TID) | ORAL | 3 refills | Status: DC

## 2023-12-28 MED ORDER — PROGESTERONE 200 MG PO CAPS: 200.0000 mg | ORAL_CAPSULE | Freq: Every day | ORAL | Status: DC

## 2023-12-28 MED ORDER — GABAPENTIN 300 MG PO CAPS
900.0000 mg | ORAL_CAPSULE | Freq: Three times a day (TID) | ORAL | 3 refills | Status: DC
Start: 2023-12-28 — End: 2024-10-21

## 2023-12-28 MED ORDER — PANTOPRAZOLE SODIUM 40 MG PO TBEC
40.0000 mg | DELAYED_RELEASE_TABLET | Freq: Two times a day (BID) | ORAL | 3 refills | Status: DC
Start: 2023-12-28 — End: 2023-12-28

## 2023-12-28 MED ORDER — LOSARTAN POTASSIUM 25 MG PO TABS: ORAL_TABLET | ORAL | 2 refills | Status: DC

## 2023-12-28 MED ORDER — LOSARTAN POTASSIUM-HCTZ 50-12.5 MG PO TABS: 1.0000 | ORAL_TABLET | Freq: Every day | ORAL | 2 refills | Status: DC

## 2023-12-28 NOTE — Assessment & Plan Note (Signed)
chronic, stable taking Protonix 40mg  bid d/t hx of gastric bypass sending refill x 1 year per pharmacy benefit f/u 6 mos

## 2023-12-28 NOTE — Patient Instructions (Addendum)
It was very nice to see you today!   I will review your lab results via MyChart in a few days.  Let us know when you need refills.  Schedule a 6 month follow up visit.   PLEASE NOTE:  If you had any lab tests please let us know if you have not heard back within a few days. You may see your results on MyChart before we have a chance to review them but we will give you a call once they are reviewed by [place]. If we ordered any referrals today, please let us know if you have not heard from their office within the next week.

## 2023-12-28 NOTE — Assessment & Plan Note (Signed)
chronic stable on Elavil  50mg  qhs pt did not wean off Gabapentin 600mg  tid, back up to 900mg  tid sending refill today f/u 6 mos

## 2023-12-28 NOTE — Progress Notes (Signed)
Patient ID: Michele Duncan, female    DOB: February 01, 1951, 73 y.o.   MRN: [id number]  Chief Complaint  Patient presents with   Annual Exam    Fasting w/ labs       Discussed the use of AI scribe software for clinical note transcription with the patient, who gave verbal consent to proceed.  History of Present Illness   The patient presents for follow up on her hypothyroidism, HTN, peripheral neuropathy, HRT, & GERD. She is taking levothyroxine, Gabapentin tid, Losartan-HCTZ, Estrace, and progesterone and Protonix. She also has concerns about weight gain and excessive salivation in corners of her mouth. She denies any intentional weight loss efforts and expresses a desire to lose weight. She reports a lack of appetite until late in the day, often eating one large meal in the evening, because she doesn't fall asleep until 2am, sleeping until 11am. She also reports a constant saliva, which is not drooling down but is constantly present in corners of mouth and requires frequent wiping. This is causing her embarrassment and discomfort, but denies skin breakdown or cracking. She denies any changes in medication or other potential triggers for this symptom.     Assessment & Plan:     Weight Management/Obesity - Patient expressed concern about weight gain and lack of appetite until late in the day. Discussed the importance of regular meal times and limiting carbohydrate intake, especially in the evening. -Encouraged patient to establish regular meal times and consume most calories earlier in the day. -Advised to limit carbohydrate intake, especially in the evening, and avoid sweets.  Excessive Salivation - Patient reported constant salivation but no associated symptoms such as lip cracking. -Advised to use lip balm or petroleum jelly to protect the skin from saliva and prevent potential skin breakdown.  HTN -  Chronic, stable Taking Losartan 50mg -12.5mg  with 25mg  Losartan no refills needed  today checking CMP f/u in 6 mos  Hypothyroidism - chronic, stable taking Levothyroxine pt reports continued fatigue - sleeps from 2am-11am d/t still on cg schedule when taking care of her husband before he died advised to back her awake time in the am by one hour each time, set an alarm rechecking free T4 again today, 10/2022 wnl f/u 6mos  GERD -  chronic, stable taking Protonix 40mg  bid d/t hx of gastric bypass sending refill x 1 year per pharmacy benefit f/u 6 mos  HRT -  Chronic, stable progesterone 200mg  qhs, and Estradiol 1mg  qd tolerating, no SE, no postmenopausal sx last mammogram normal - 2024 no refills needed today f/u 62yr.  Peripheral neuropathy - chronic stable on Elavil  50mg  qhs pt did not wean off Gabapentin 600mg  tid, back up to 900mg  tid sending refill today f/u 6 mos  Subjective:    Outpatient Medications Prior to Visit  Medication Sig Dispense Refill   acetaminophen (TYLENOL) 650 MG CR tablet Take 650 mg by mouth every 8 (eight) hours as needed for pain.     amitriptyline (ELAVIL) 50 MG tablet Take 1 tablet (50 mg total) by mouth at bedtime. 30 tablet 0   Cholecalciferol (VITAMIN D-3 PO) Take 7,000 Int'l Units by mouth daily.     estradiol (ESTRACE) 1 MG tablet Take 1 tablet (1 mg total) by mouth daily. 90 tablet 1   gabapentin (NEURONTIN) 300 MG capsule TAKE 2 CAPS THREE TIMES DAILY.  REDUCE DAILY DOSE BY 300MG  EVERY WEEK. IF FOOT NEUROPATHY  RETURNS, INCREASE BACK DAILY  DOSE BY 300MG  &amp;STAY ON  THIS DOSE 600 capsule 2   levothyroxine (SYNTHROID) 137 MCG tablet TAKE 1 TABLET BY MOUTH DAILY  BEFORE BREAKFAST 100 tablet 2   losartan (COZAAR) 25 MG tablet TAKE 1 TABLET BY MOUTH DAILY ,  TAKE WITH LOSARTAN/HCTZ TABLET  50-12.5 MG 100 tablet 2   losartan-hydrochlorothiazide (HYZAAR) 50-12.5 MG tablet TAKE 1 TABLET BY MOUTH DAILY 100 tablet 2   MAGNESIUM PO Take 500 mg by mouth in the morning and at bedtime.     naproxen (NAPROSYN) 500 MG tablet  TAKE 1 TABLET BY MOUTH TWICE  DAILY WITH A MEAL 200 tablet 2   pantoprazole (PROTONIX) 40 MG tablet TAKE 1 TABLET BY MOUTH TWICE  DAILY 180 tablet 3   potassium chloride SA (KLOR-CON M) 20 MEQ tablet TAKE 2 TABLETS BY MOUTH IN THE  MORNING 200 tablet 2   progesterone (PROMETRIUM) 200 MG capsule TAKE 1 CAPSULE BY MOUTH AT  BEDTIME 100 capsule 2   estradiol (ESTRACE) 2 MG tablet TAKE ONE-HALF TABLET BY MOUTH  DAILY (Patient not taking: Reported on 12/28/2023) 50 tablet 2   No facility-administered medications prior to visit.   Past Medical History:  Diagnosis Date   Anxiety    Arthritis    low back   Chronic low back pain    Chronic posterior anal fissure 02/28/2018   Essential hypertension, benign 10/17/2019   External hemorrhoids with pain 02/28/2018   GERD (gastroesophageal reflux disease) 10/17/2019   Grade II internal hemorrhoids    History of adenomatous polyp of colon    11-06-2017   HLD (hyperlipidemia) 10/17/2019   Hot flashes due to menopause 10/17/2019   Rectal bleeding 11/02/2017   Vitamin D deficiency disease 10/17/2019   Wears glasses    Past Surgical History:  Procedure Laterality Date   BREAST BIOPSY Left 1970s   benign    COLONOSCOPY N/A 11/06/2017   Procedure: COLONOSCOPY;  Surgeon: West Bali, MD;  [facility];  Service: Endoscopy;  Laterality: N/A;  1:45pm   HEMORRHOID SURGERY N/A 02/28/2018   Procedure: HEMORRHOIDECTOMY;  Surgeon: Karie Soda, MD;  Location: [facility] [place];  Service: General;  Laterality: N/A;   repair anal sphincter     SPHINCTEROTOMY N/A 02/28/2018   Procedure: LATERAL SPHINCTEROTOMY;  Surgeon: Karie Soda, MD;  Location: [facility];  Service: General;  Laterality: N/A;   Allergies  Allergen Reactions   Penicillins Shortness Of Breath and Itching    Has patient had a PCN reaction causing immediate rash, facial/tongue/throat swelling, SOB or lightheadedness with hypotension: Yes Has patient  had a PCN reaction causing severe rash involving mucus membranes or skin necrosis: No Has patient had a PCN reaction that required hospitalization: No Has patient had a PCN reaction occurring within the last 10 years: No If all of the above answers are "NO", then may proceed with Cephalosporin use.    Codeine Itching      Objective:    Physical Exam Vitals and nursing note reviewed.  Constitutional:      Appearance: Normal appearance.  Cardiovascular:     Rate and Rhythm: Normal rate and regular rhythm.  Pulmonary:     Effort: Pulmonary effort is normal.     Breath sounds: Normal breath sounds.  Musculoskeletal:        General: Normal range of motion.  Skin:    General: Skin is warm and dry.  Neurological:     Mental Status: She is alert.  Psychiatric:  Mood and Affect: Mood normal.        Behavior: Behavior normal.    BP 130/82 (BP Location: Left Arm, Patient Position: Sitting, Cuff Size: Normal)   Pulse 68   Temp (!) 97.3 F (36.3 C) (Temporal)   Ht 5\' 4"  (1.626 m)   Wt 188 lb 12.8 oz (85.6 kg)   SpO2 99%   BMI 32.41 kg/m  Wt Readings from Last 3 Encounters:  12/28/23 188 lb 12.8 oz (85.6 kg)  09/18/23 181 lb (82.1 kg)  02/20/23 181 lb 4 oz (82.2 kg)     Dulce Sellar, NP

## 2023-12-28 NOTE — Assessment & Plan Note (Signed)
chronic taking Levothyroxine pt reports continued fatigue - sleeps from 2am-11am d/t still on cg schedule when taking care of her husband before he died advised to back her awake time in the am by one hour each time, set an alarm rechecking free T4 again today, 10/2022 wnl f/u 6mos

## 2023-12-28 NOTE — Assessment & Plan Note (Signed)
Chronic, stable progesterone 200mg  qhs, and Estradiol 1mg  qd tolerating, no SE, no postmenopausal sx last mammogram normal - 2024 no refills needed today f/u 81yr.

## 2023-12-28 NOTE — Assessment & Plan Note (Signed)
Chronic Taking Losartan 50mg -12.5mg  with 25mg  Losartan no refills needed today checking CMP f/u in 6 mos

## 2024-01-01 ENCOUNTER — Encounter: Payer: Self-pay | Admitting: Family

## 2024-01-22 ENCOUNTER — Other Ambulatory Visit: Payer: Self-pay | Admitting: Family

## 2024-01-22 NOTE — Telephone Encounter (Signed)
 Copied from CRM [id number]. Topic: Clinical - Medication Refill >> Jan 22, 2024 12:58 PM Alcus Dad wrote: Most Recent Primary Care Visit:  Provider: Dulce Sellar  Department: [facility]  Visit Type: PHYSICAL  Date: 12/28/2023 Medication: amitriptyline (ELAVIL) 50 MG tablet   Has the patient contacted their pharmacy? Yes (Agent: If no, request that the patient contact the pharmacy for the refill. If patient does not wish to contact the pharmacy document the reason why and proceed with request.) (Agent: If yes, when and what did the pharmacy advise?)  Is this the correct pharmacy for this prescription? Yes If no, delete pharmacy and type the correct one.  This is the patient's preferred pharmacy:  [facility] [address] [place] - [address] [place] [address] Phone: [phone] Fax: [phone]  [facility] - [place], [place] - [address] [place] [address] Phone: [phone] Fax: [phone]   Has the prescription been filled recently? Yes  Is the patient out of the medication? Yes  Has the patient been seen for an appointment in the last year OR does the patient have an upcoming appointment? Yes  Can we respond through MyChart? Yes  Agent: Please be advised that Rx refills may take up to 3 business days. We ask that you follow-up with your pharmacy.

## 2024-01-24 ENCOUNTER — Encounter: Payer: Self-pay | Admitting: Family

## 2024-01-24 ENCOUNTER — Telehealth (INDEPENDENT_AMBULATORY_CARE_PROVIDER_SITE_OTHER): Payer: Medicare Other | Admitting: Family

## 2024-01-24 VITALS — Ht 64.0 in | Wt 188.0 lb

## 2024-01-24 DIAGNOSIS — G609 Hereditary and idiopathic neuropathy, unspecified: Secondary | ICD-10-CM

## 2024-01-24 MED ORDER — AMITRIPTYLINE HCL 50 MG PO TABS: 50.0000 mg | ORAL_TABLET | Freq: Every day | ORAL | 1 refills | Status: DC

## 2024-01-24 NOTE — Telephone Encounter (Signed)
 Pt seen in VV with PCP today

## 2024-01-24 NOTE — Progress Notes (Unsigned)
 MyChart Video Visit    Virtual Visit via Video Note   This format is felt to be most appropriate for this patient at this time. Physical exam was limited by quality of the video and audio technology used for the visit. CMA was able to get the patient set up on a video visit.  Patient location: Home. Patient and provider in visit Provider location: Office  I discussed the limitations of evaluation and management by telemedicine and the availability of in person appointments. The patient expressed understanding and agreed to proceed.  Visit Date: 01/24/2024  Today's healthcare provider: Dulce Sellar, NP     Subjective:   Patient ID: Michele Duncan, female    DOB: 1951-04-08, 73 y.o.   MRN: [id number]  Chief Complaint  Patient presents with   Medication Refill    Pt needing medication refills   Discussed the use of AI scribe software for clinical note transcription with the patient, who gave verbal consent to proceed.  History of Present Illness   Michele Duncan is a 73 year old female who presents for medication management related to neuropathy. She experiences neuropathy characterized by 'weird sensations' that sometimes manifest as tingling or numbness in her body. These symptoms have been persistent and impact her daily activities. She has been using gabapentin for these symptoms. Previously, she was on a 50 mg dose of amitriptyline, which also aids her sleep. Although there was a discussion about weaning off amitriptyline due to perceived lack of efficacy, she continues to use it as it helps with her symptoms. There was a recent issue with her prescription being sent to the wrong pharmacy, but she received a 30-day supply and is awaiting a 90-day refill.     Assessment & Plan:   Neuropathy - Patient reports benefit from Amitriptyline 50mg  for neuropathic symptoms and sleep. There was confusion about discontinuation of the medication. -Refill Amitriptyline 50mg ,  instruct patient to continue as previously directed. -Send prescription for 90-day supply to [facility] in [place]. -F/U in 6 mos      Past Medical History:  Diagnosis Date   Anxiety    Arthritis    low back   Chronic low back pain    Chronic posterior anal fissure 02/28/2018   Essential hypertension, benign 10/17/2019   External hemorrhoids with pain 02/28/2018   GERD (gastroesophageal reflux disease) 10/17/2019   Grade II internal hemorrhoids    History of adenomatous polyp of colon    11-06-2017   HLD (hyperlipidemia) 10/17/2019   Hot flashes due to menopause 10/17/2019   Inguinal pain 07/13/2021   Rectal bleeding 11/02/2017   Vitamin D deficiency disease 10/17/2019   Wears glasses     Past Surgical History:  Procedure Laterality Date   BREAST BIOPSY Left 1970s   benign    COLONOSCOPY N/A 11/06/2017   Procedure: COLONOSCOPY;  Surgeon: West Bali, MD;  [facility];  Service: Endoscopy;  Laterality: N/A;  1:45pm   HEMORRHOID SURGERY N/A 02/28/2018   Procedure: HEMORRHOIDECTOMY;  Surgeon: Karie Soda, MD;  Location: [facility];  Service: General;  Laterality: N/A;   repair anal sphincter     SPHINCTEROTOMY N/A 02/28/2018   Procedure: LATERAL SPHINCTEROTOMY;  Surgeon: Karie Soda, MD;  Location: [facility];  Service: General;  Laterality: N/A;    Outpatient Medications Prior to Visit  Medication Sig Dispense Refill   acetaminophen (TYLENOL) 650 MG CR tablet Take 650 mg by mouth every 8 (eight)  hours as needed for pain.     Cholecalciferol (VITAMIN D-3 PO) Take 7,000 Int'l Units by mouth daily.     estradiol (ESTRACE) 1 MG tablet Take 1 tablet (1 mg total) by mouth daily. 100 tablet 2   gabapentin (NEURONTIN) 300 MG capsule Take 3 capsules (900 mg total) by mouth 3 (three) times daily. 810 capsule 3   levothyroxine (SYNTHROID) 137 MCG tablet TAKE 1 TABLET BY MOUTH DAILY  BEFORE BREAKFAST 100 tablet 2   losartan (COZAAR)  25 MG tablet TAKE 1 TABLET BY MOUTH DAILY ,  TAKE WITH LOSARTAN/HCTZ TABLET  50-12.5 MG 100 tablet 2   losartan-hydrochlorothiazide (HYZAAR) 50-12.5 MG tablet Take 1 tablet by mouth daily. 100 tablet 2   MAGNESIUM PO Take 500 mg by mouth in the morning and at bedtime.     naproxen (NAPROSYN) 500 MG tablet TAKE 1 TABLET BY MOUTH TWICE  DAILY WITH A MEAL 200 tablet 2   pantoprazole (PROTONIX) 40 MG tablet Take 1 tablet (40 mg total) by mouth 2 (two) times daily. 180 tablet 3   progesterone (PROMETRIUM) 200 MG capsule Take 1 capsule (200 mg total) by mouth at bedtime.     potassium chloride SA (KLOR-CON M) 20 MEQ tablet Take 1 tablet (20 mEq total) by mouth once for 1 dose. 200 tablet 2   No facility-administered medications prior to visit.    Allergies  Allergen Reactions   Penicillins Shortness Of Breath and Itching    Has patient had a PCN reaction causing immediate rash, facial/tongue/throat swelling, SOB or lightheadedness with hypotension: Yes Has patient had a PCN reaction causing severe rash involving mucus membranes or skin necrosis: No Has patient had a PCN reaction that required hospitalization: No Has patient had a PCN reaction occurring within the last 10 years: No If all of the above answers are "NO", then may proceed with Cephalosporin use.    Codeine Itching       Objective:   Physical Exam Vitals and nursing note reviewed.  Constitutional:      General: Pt is not in acute distress.    Appearance: Normal appearance.  HENT:     Head: Normocephalic.  Pulmonary:     Effort: No respiratory distress.  Musculoskeletal:     Cervical back: Normal range of motion.  Skin:    General: Skin is dry.     Coloration: Skin is not pale.  Neurological:     Mental Status: Pt is alert and oriented to person, place, and time.  Psychiatric:        Mood and Affect: Mood normal.   Ht 5\' 4"  (1.626 m)   Wt 188 lb (85.3 kg)   BMI 32.27 kg/m   Wt Readings from Last 3 Encounters:   01/24/24 188 lb (85.3 kg)  12/28/23 188 lb 12.8 oz (85.6 kg)  09/18/23 181 lb (82.1 kg)      I discussed the assessment and treatment plan with the patient. The patient was provided an opportunity to ask questions and all were answered. The patient agreed with the plan and demonstrated an understanding of the instructions.   The patient was advised to call back or seek an in-person evaluation if the symptoms worsen or if the condition fails to improve as anticipated.  Dulce Sellar, NP [facility] [phone] (phone) [phone] (fax)  [facility]

## 2024-01-25 NOTE — Assessment & Plan Note (Signed)
 Patient reports benefit from Amitriptyline 50mg  for neuropathic symptoms and sleep. There was confusion about discontinuation of the medication. -Refill Amitriptyline 50mg , instruct patient to continue as previously directed. -Send prescription for 90-day supply to [facility] in [place]. -F/U in 6 mos

## 2024-02-09 ENCOUNTER — Other Ambulatory Visit: Payer: Self-pay | Admitting: Family

## 2024-02-09 DIAGNOSIS — E039 Hypothyroidism, unspecified: Secondary | ICD-10-CM

## 2024-06-26 ENCOUNTER — Encounter: Payer: Self-pay | Admitting: Family

## 2024-06-26 ENCOUNTER — Ambulatory Visit: Payer: Medicare Other | Admitting: Family

## 2024-06-26 VITALS — BP 135/81 | HR 88 | Temp 98.2°F | Ht 64.0 in | Wt 186.2 lb

## 2024-06-26 DIAGNOSIS — E039 Hypothyroidism, unspecified: Secondary | ICD-10-CM

## 2024-06-26 DIAGNOSIS — L2089 Other atopic dermatitis: Secondary | ICD-10-CM

## 2024-06-26 DIAGNOSIS — G609 Hereditary and idiopathic neuropathy, unspecified: Secondary | ICD-10-CM

## 2024-06-26 DIAGNOSIS — Z79890 Hormone replacement therapy (postmenopausal): Secondary | ICD-10-CM | POA: Diagnosis not present

## 2024-06-26 DIAGNOSIS — E782 Mixed hyperlipidemia: Secondary | ICD-10-CM

## 2024-06-26 DIAGNOSIS — K219 Gastro-esophageal reflux disease without esophagitis: Secondary | ICD-10-CM

## 2024-06-26 DIAGNOSIS — I10 Essential (primary) hypertension: Secondary | ICD-10-CM

## 2024-06-26 LAB — COMPREHENSIVE METABOLIC PANEL WITH GFR
ALT: 11 U/L (ref 0–35)
AST: 12 U/L (ref 0–37)
Albumin: 4.1 g/dL (ref 3.5–5.2)
Alkaline Phosphatase: 64 U/L (ref 39–117)
BUN: 15 mg/dL (ref 6–23)
CO2: 28 meq/L (ref 19–32)
Calcium: 9.3 mg/dL (ref 8.4–10.5)
Chloride: 100 meq/L (ref 96–112)
Creatinine, Ser: 0.91 mg/dL (ref 0.40–1.20)
GFR: 63.67 mL/min (ref >60.00)
Glucose, Bld: 102 mg/dL — ABNORMAL HIGH (ref 70–99)
Potassium: 3.7 meq/L (ref 3.5–5.1)
Sodium: 137 meq/L (ref 135–145)
Total Bilirubin: 0.4 mg/dL (ref 0.2–1.2)
Total Protein: 6.7 g/dL (ref 6.0–8.3)

## 2024-06-26 LAB — LIPID PANEL
Cholesterol: 205 mg/dL — ABNORMAL HIGH (ref 0–200)
HDL: 72.1 mg/dL (ref >39.00)
LDL Cholesterol: 104 mg/dL — ABNORMAL HIGH (ref 0–99)
NonHDL: 133.17
Total CHOL/HDL Ratio: 3
Triglycerides: 145 mg/dL (ref 0.0–149.0)
VLDL: 29 mg/dL (ref 0.0–40.0)

## 2024-06-26 MED ORDER — AMITRIPTYLINE HCL 50 MG PO TABS: 50.0000 mg | ORAL_TABLET | Freq: Every day | ORAL | 1 refills | Status: DC

## 2024-06-26 MED ORDER — PITAVASTATIN CALCIUM 1 MG PO TABS: 1.0000 mg | ORAL_TABLET | ORAL | 2 refills | Status: AC

## 2024-06-26 MED ORDER — TRIAMCINOLONE ACETONIDE 0.1 % EX CREA: 1.0000 | TOPICAL_CREAM | Freq: Two times a day (BID) | CUTANEOUS | 2 refills | Status: AC

## 2024-06-26 NOTE — Assessment & Plan Note (Signed)
 Last lipids in 12/2023- LDL 105 mg/dL, total cholesterol 784 mg/dL, triglycerides 795 mg/dL. Has been treating with diet. Discussed 10-year MI risk 15%. - Order lipid panel today, pt is fasting - Prescribe generic Livalo  1mg  qd, low dose, advised on possible SE and dose with slow titration. - Advised again on dietary modifications. - F/U in 6 mos

## 2024-06-26 NOTE — Assessment & Plan Note (Signed)
 On hormone replacement therapy, Estradiol  1mg  qd & progesterone  200mg  qhs. - Continue hormone replacement therapy, no refills needed today - F/U 6 mos

## 2024-06-26 NOTE — Progress Notes (Signed)
 Patient ID: Michele Duncan, female    DOB: [birth year], 73 y.o.   MRN: [id number]  Chief Complaint  Patient presents with   Hypertension   Hypothyroidism   Hyperlipidemia    Fasting   Gastroesophageal Reflux   Peripheral Neuropathy  Discussed the use of AI scribe software for clinical note transcription with the patient, who gave verbal consent to proceed.  History of Present Illness Michele Duncan is a 73 year old female with hyperlipidemia who presents for follow-up and medication refills.  Hyperlipidemia and statin intolerance - LDL cholesterol 105 mg/dL; Total cholesterol 215 mg/dL in 07/7973 - Concerns about statin side effects, including joint and muscle pain - Has not taken statins due to side effect concerns - Diet is low in saturated fats  Weight management and dietary habits - Struggles with weight loss despite dietary efforts, weight down 2lbs - Difficulty with carbohydrate intake, especially in the evenings - Eats one meal a day, usually in the evening - Not often hungry - Family history of larger body size - Questions if thyroid  condition affects weight  Thyroid  disease - Takes 137 micrograms of Levothyroxine , which is stable  Hypertension - Blood pressure slightly elevated at 135 mmHg - Takes losartan -hydrochlorothiazide  and an additional dose of losartan  in the morning - No lightheadedness or dizziness  Peripheral neuropathy - Takes gabapentin  900mg  three times a day for neuropathy in her feet - Gabapentin  can impact weight gaom - Monitor kidney function due to gabapentin  use  Dermatologic symptoms - Skin issues on arms, possibly psoriasis or eczema - Uses triamcinolone  cream for affected areas  Assessment & Plan Hyperlipidemia Last lipids in 12/2023- LDL 105 mg/dL, total cholesterol 784 mg/dL, triglycerides 795 mg/dL. Has been treating with diet. Discussed 10-year MI risk 15%. - Order lipid panel today, pt is fasting - Prescribe generic Livalo  1mg  qd, low  dose, advised on possible SE and dose with slow titration. - Advised again on dietary modifications. - F/U in 6 mos  Hypertension Blood pressure 135 mmHg systolic today, managed with losartan -hydrochlorothiazide  50-12.5mg  and losartan  25mg .  - Continue losartan -hydrochlorothiazide  and losartan , no refills needed today. - Checking CMP & lipids today - F/U in 6mos  Hypothyroidism Well-managed on levothyroxine  137 mcg, no new concerns other than difficulty losing weight, down 2lbs in last 68m. Discussed Gabapentin 's possible role in weight. - Continue levothyroxine  137 mcg daily. - F/U in 6 mos   Peripheral Neuropathy Numbness in feet managed with gabapentin  900mg  tid & Elavil  50mg  qhs. - Continue gabapentin  900mg  tid, no refill needed today. - Discussed trying to cut dose as able if does not worsen sx d/t SE of Gabapentin  with kidney fx & wt gain. - Monitor renal function every six months. - F/U in 6 mos  Eczema/Psoriasis Dermatological issues on arms, managed with triamcinolone  cream. - Prescribe triamcinolone  cream 0.1% bid. - Call back if sx are not improving.  Hormone Replacement Therapy On hormone replacement therapy, Estradiol  1mg  qd & progesterone  200mg  qhs. - Continue hormone replacement therapy, no refills needed today - F/U 6 mos   Subjective:    Outpatient Medications Prior to Visit  Medication Sig Dispense Refill   acetaminophen  (TYLENOL ) 650 MG CR tablet Take 650 mg by mouth every 8 (eight) hours as needed for pain.     Cholecalciferol (VITAMIN D -3 PO) Take 7,000 Int'l Units by mouth daily.     estradiol  (ESTRACE ) 1 MG tablet Take 1 tablet (1 mg total) by mouth daily. 100 tablet 2  gabapentin  (NEURONTIN ) 300 MG capsule Take 3 capsules (900 mg total) by mouth 3 (three) times daily. 810 capsule 3   levothyroxine  (SYNTHROID ) 137 MCG tablet TAKE 1 TABLET BY MOUTH DAILY  BEFORE BREAKFAST 100 tablet 2   losartan  (COZAAR ) 25 MG tablet TAKE 1 TABLET BY MOUTH DAILY ,  TAKE  WITH LOSARTAN /HCTZ TABLET  50-12.5 MG 100 tablet 2   losartan -hydrochlorothiazide  (HYZAAR) 50-12.5 MG tablet Take 1 tablet by mouth daily. 100 tablet 2   MAGNESIUM PO Take 500 mg by mouth in the morning and at bedtime.     naproxen  (NAPROSYN ) 500 MG tablet TAKE 1 TABLET BY MOUTH TWICE  DAILY WITH A MEAL 200 tablet 2   pantoprazole  (PROTONIX ) 40 MG tablet Take 1 tablet (40 mg total) by mouth 2 (two) times daily. 180 tablet 3   potassium chloride  SA (KLOR-CON  M) 20 MEQ tablet Take 1 tablet (20 mEq total) by mouth once for 1 dose. 200 tablet 2   progesterone  (PROMETRIUM ) 200 MG capsule Take 1 capsule (200 mg total) by mouth at bedtime.     amitriptyline  (ELAVIL ) 50 MG tablet Take 1 tablet (50 mg total) by mouth at bedtime. 90 tablet 1   No facility-administered medications prior to visit.   Past Medical History:  Diagnosis Date   Anxiety    Arthritis    low back   Chronic low back pain    Chronic posterior anal fissure 02/28/2018   Essential hypertension, benign 10/17/2019   External hemorrhoids with pain 02/28/2018   GERD (gastroesophageal reflux disease) 10/17/2019   Grade II internal hemorrhoids    History of adenomatous polyp of colon    11-06-2017   HLD (hyperlipidemia) 10/17/2019   Hot flashes due to menopause 10/17/2019   Inguinal pain 07/13/2021   Rectal bleeding 11/02/2017   Vitamin D  deficiency disease 10/17/2019   Wears glasses    Past Surgical History:  Procedure Laterality Date   BREAST BIOPSY Left 1970s   benign    COLONOSCOPY N/A 11/06/2017   Procedure: COLONOSCOPY;  Surgeon: Harvey Margo CROME, MD;  [facility];  Service: Endoscopy;  Laterality: N/A;  1:45pm   HEMORRHOID SURGERY N/A 02/28/2018   Procedure: HEMORRHOIDECTOMY;  Surgeon: Sheldon Standing, MD;  Location: [facility];  Service: General;  Laterality: N/A;   repair anal sphincter     SPHINCTEROTOMY N/A 02/28/2018   Procedure: LATERAL SPHINCTEROTOMY;  Surgeon: Sheldon Standing, MD;   [facility];  Service: General;  Laterality: N/A;   Allergies  Allergen Reactions   Penicillins Shortness Of Breath and Itching    Has patient had a PCN reaction causing immediate rash, facial/tongue/throat swelling, SOB or lightheadedness with hypotension: Yes Has patient had a PCN reaction causing severe rash involving mucus membranes or skin necrosis: No Has patient had a PCN reaction that required hospitalization: No Has patient had a PCN reaction occurring within the last 10 years: No If all of the above answers are NO, then may proceed with Cephalosporin use.    Codeine Itching      Objective:    Physical Exam Vitals and nursing note reviewed.  Constitutional:      Appearance: Normal appearance. She is obese.  Cardiovascular:     Rate and Rhythm: Normal rate and regular rhythm.  Pulmonary:     Effort: Pulmonary effort is normal.     Breath sounds: Normal breath sounds.  Musculoskeletal:        General: Normal range of motion.  Skin:    General: Skin is warm and dry.  Neurological:     Mental Status: She is alert.  Psychiatric:        Mood and Affect: Mood normal.        Behavior: Behavior normal.    BP 135/81 (BP Location: Left Arm, Patient Position: Sitting, Cuff Size: Large)   Pulse 88   Temp 98.2 F (36.8 C) (Temporal)   Ht 5' 4 (1.626 m)   Wt 186 lb 3.2 oz (84.5 kg)   SpO2 99%   BMI 31.96 kg/m  Wt Readings from Last 3 Encounters:  06/26/24 186 lb 3.2 oz (84.5 kg)  01/24/24 188 lb (85.3 kg)  12/28/23 188 lb 12.8 oz (85.6 kg)      Lucius Krabbe, NP

## 2024-06-26 NOTE — Assessment & Plan Note (Signed)
 Well-managed on levothyroxine  137 mcg, no new concerns other than difficulty losing weight, down 2lbs in last 47m. Discussed Gabapentin 's possible role in weight. - Continue levothyroxine  137 mcg daily. - F/U in 6 mos

## 2024-06-26 NOTE — Assessment & Plan Note (Signed)
 Numbness in feet managed with gabapentin  900mg  tid & Elavil  50mg  qhs. - Continue gabapentin  900mg  tid, no refill needed today. - Discussed trying to cut dose as able if does not worsen sx d/t SE of Gabapentin  with kidney fx & wt gain. - Monitor renal function every six months. - F/U in 6 mos

## 2024-06-26 NOTE — Assessment & Plan Note (Signed)
 Blood pressure 135 mmHg systolic today, managed with losartan -hydrochlorothiazide  50-12.5mg  and losartan  25mg .  - Continue losartan -hydrochlorothiazide  and losartan , no refills needed today. - Checking CMP & lipids today - F/U in 6mos

## 2024-06-26 NOTE — Patient Instructions (Signed)
 It was very nice to see you today!   I will review your lab results via MyChart in a few days.  I have sent the steroid cream and cholesterol medication (Pitavastatin ) to [facility]. I have sent a refill of the Amitriptyline  to [facility].  Schedule a 6 mo follow up visit today.  Start working on what we discussed today regarding your weight loss goals:  1) Eat small portions, ok to eat 6 mini meals vs. 3 big meals if this controls your hunger  better. 2) Eat until full and then STOP! This is your body telling you it has had enough. 3) Drink at least 64 oz water  = 2 liters = 8, 8oz cups DAILY. 4) Eat most of your calories earlier in the day (if you work during the day).  Want to eat within a 8-10hour window if possible. No eating after 7pm, only no calorie drinks or water  from 7p until bedtime. 5) Look for low carb, high protein foods/recipes. Avoid simple carbs including sweets, white bread, rice, potatoes, pasta. Look for whole grain/whole wheat/or almond flour if eating these foods. 6) High protein foods: meats (limit red meat), including [place], chicken, pork, FISH (best source) nuts, cheese, beans (black beans, soy/edamame beans are best). Protein drinks, bars are also good but must have low sugar, look for < 10 grams. 7) Avoid processed/refined sugar and artificial sweeteners if possible. Stevia, Erythritol, or Monk fruit sweetener are preferred, natural, no calorie sweeteners.  Natural sweeteners like Agave or honey in very small amounts are ok also.

## 2024-06-27 ENCOUNTER — Other Ambulatory Visit: Payer: Self-pay | Admitting: Family

## 2024-06-27 ENCOUNTER — Telehealth: Payer: Self-pay | Admitting: Family

## 2024-06-27 DIAGNOSIS — I10 Essential (primary) hypertension: Secondary | ICD-10-CM

## 2024-06-27 DIAGNOSIS — E876 Hypokalemia: Secondary | ICD-10-CM

## 2024-06-27 NOTE — Telephone Encounter (Unsigned)
 Copied from CRM [id number]. Topic: Clinical - Medical Advice >> Jun 27, 2024 12:24 PM Gennette ORN wrote: Reason for CRM: Patient needs another medication prescribed because the cost is $362.00 . She wants advice on some different medication for high cholesterol.

## 2024-07-01 ENCOUNTER — Ambulatory Visit: Payer: Self-pay | Admitting: Family

## 2024-09-25 ENCOUNTER — Ambulatory Visit: Payer: Medicare Other

## 2024-09-25 VITALS — Ht 64.0 in | Wt 186.0 lb

## 2024-09-25 DIAGNOSIS — Z0000 Encounter for general adult medical examination without abnormal findings: Secondary | ICD-10-CM

## 2024-09-25 DIAGNOSIS — E782 Mixed hyperlipidemia: Secondary | ICD-10-CM

## 2024-09-25 NOTE — Progress Notes (Addendum)
 Subjective:   Michele Duncan is a 73 y.o. who presents for a Medicare Wellness preventive visit.  As a reminder, Annual Wellness Visits don't include a physical exam, and some assessments may be limited, especially if this visit is performed virtually. We may recommend an in-person follow-up visit with your provider if needed.  Visit Complete: Virtual I connected with  Michele Duncan on 09/25/24 by a video and audio enabled telemedicine application and verified that I am speaking with the correct person using two identifiers.  Patient Location: Home  Provider Location: Home Office  I discussed the limitations of evaluation and management by telemedicine. The patient expressed understanding and agreed to proceed.  Vital Signs: Because this visit was a virtual/telehealth visit, some criteria may be missing or patient reported. Any vitals not documented were not able to be obtained and vitals that have been documented are patient reported.    Persons Participating in Visit: Patient.  AWV Questionnaire: No: Patient Medicare AWV questionnaire was not completed prior to this visit.  Cardiac Risk Factors include: advanced age (>75men, >76 women);hypertension;dyslipidemia;obesity (BMI >30kg/m2)     Objective:    Today's Vitals   09/25/24 1015  Weight: 186 lb (84.4 kg)  Height: 5' 4 (1.626 m)   Body mass index is 31.93 kg/m.     09/25/2024   10:23 AM 09/18/2023    3:33 PM 05/30/2022   11:35 AM 05/12/2021   12:32 AM 02/19/2020    2:04 PM 01/21/2020    2:07 PM 02/28/2018    7:02 AM  Advanced Directives  Does Patient Have a Medical Advance Directive? No Yes Yes No No Yes Yes   Type of Furniture Conservator/restorer;Living will Healthcare Power of Attorney   Living will;Healthcare Power of Attorney Living will  Does patient want to make changes to medical advance directive?      No - Patient declined No - Patient declined   Copy of Healthcare Power of Attorney in Chart?   No - copy requested No - copy requested   Yes - validated most recent copy scanned in chart (See row information)   Would patient like information on creating a medical advance directive? No - Patient declined    No - Patient declined       Data saved with a previous flowsheet row definition    Current Medications (verified) Outpatient Encounter Medications as of 09/25/2024  Medication Sig   acetaminophen  (TYLENOL ) 650 MG CR tablet Take 650 mg by mouth every 8 (eight) hours as needed for pain.   amitriptyline  (ELAVIL ) 50 MG tablet Take 1 tablet (50 mg total) by mouth at bedtime.   Cholecalciferol (VITAMIN D -3 PO) Take 7,000 Int'l Units by mouth daily.   estradiol  (ESTRACE ) 1 MG tablet Take 1 tablet (1 mg total) by mouth daily.   gabapentin  (NEURONTIN ) 300 MG capsule Take 3 capsules (900 mg total) by mouth 3 (three) times daily.   levothyroxine  (SYNTHROID ) 137 MCG tablet TAKE 1 TABLET BY MOUTH DAILY  BEFORE BREAKFAST   losartan  (COZAAR ) 25 MG tablet TAKE 1 TABLET BY MOUTH DAILY ,  TAKE WITH LOSARTAN /HCTZ TABLET  50-12.5 MG   losartan -hydrochlorothiazide  (HYZAAR) 50-12.5 MG tablet TAKE 1 TABLET BY MOUTH DAILY   MAGNESIUM PO Take 500 mg by mouth in the morning and at bedtime.   naproxen  (NAPROSYN ) 500 MG tablet TAKE 1 TABLET BY MOUTH TWICE  DAILY WITH A MEAL   pantoprazole  (PROTONIX ) 40 MG tablet Take 1 tablet (  40 mg total) by mouth 2 (two) times daily.   potassium chloride  SA (KLOR-CON  M) 20 MEQ tablet TAKE 2 TABLETS BY MOUTH IN THE  MORNING   progesterone  (PROMETRIUM ) 200 MG capsule Take 1 capsule (200 mg total) by mouth at bedtime.   triamcinolone  cream (KENALOG ) 0.1 % Apply 1 Application topically 2 (two) times daily.   Pitavastatin  Calcium  1 MG TABS Take 1 tablet (1 mg total) by mouth as directed. Start with 1 pill on Monday, Wednesday, Friday for 2 weeks, then 1 pill qod for 2 weeks, then 1 pill daily. (Patient not taking: Reported on 09/25/2024)   No facility-administered encounter  medications on file as of 09/25/2024.    Allergies (verified) Penicillins and Codeine   History: Past Medical History:  Diagnosis Date   Anxiety    Arthritis    low back   Chronic low back pain    Chronic posterior anal fissure 02/28/2018   Essential hypertension, benign 10/17/2019   External hemorrhoids with pain 02/28/2018   GERD (gastroesophageal reflux disease) 10/17/2019   Grade II internal hemorrhoids    History of adenomatous polyp of colon    11-06-2017   HLD (hyperlipidemia) 10/17/2019   Hot flashes due to menopause 10/17/2019   Inguinal pain 07/13/2021   Rectal bleeding 11/02/2017   Vitamin D  deficiency disease 10/17/2019   Wears glasses    Past Surgical History:  Procedure Laterality Date   BREAST BIOPSY Left 1970s   benign    COLONOSCOPY N/A 11/06/2017   Procedure: COLONOSCOPY;  Surgeon: Harvey Margo CROME, MD;  Location[facility];  Service: Endoscopy;  Laterality: N/A;  1:45pm   HEMORRHOID SURGERY N/A 02/28/2018   Procedure: HEMORRHOIDECTOMY;  Surgeon: Sheldon Standing, MD;  [facility];  Service: General;  Laterality: N/A;   repair anal sphincter     SPHINCTEROTOMY N/A 02/28/2018   Procedure: LATERAL SPHINCTEROTOMY;  Surgeon: Sheldon Standing, MD;  Location: [facility];  Service: General;  Laterality: N/A;   Family History  Problem Relation Age of Onset   Colon cancer Paternal Grandmother    Breast cancer Mother    Hypertension Father    Heart disease Father    Autoimmune disease Sister    Lung disease Sister    Breast cancer Maternal Aunt    Breast cancer Maternal Grandmother    Colon polyps Neg Hx    Social History   Socioeconomic History   Marital status: Widowed    Spouse name: Merilee   Number of children: Not on file   Years of education: 2   Highest education level: High school graduate  Occupational History   Not on file  Tobacco Use   Smoking status: Former    Current packs/day: 0.00    Types:  Cigarettes    Quit date: 12/05/2013    Years since quitting: 10.8   Smokeless tobacco: Never  Vaping Use   Vaping status: Never Used  Substance and Sexual Activity   Alcohol use: No   Drug use: Not Currently   Sexual activity: Yes  Other Topics Concern   Not on file  Social History Narrative   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   Lives with husband   Right handed   Caffeine: 3 cups of coffee   Social Drivers of Health   Financial Resource Strain: Low Risk  (09/25/2024)   Overall Financial Resource Strain (CARDIA)    Difficulty of Paying Living Expenses: Not hard at all  Food Insecurity: No Food Insecurity (09/25/2024)   Hunger Vital Sign    Worried About Running Out of Food in the Last Year: Never true    Ran Out of Food in the Last Year: Never true  Transportation Needs: No Transportation Needs (09/25/2024)   PRAPARE - Administrator, Civil Service (Medical): No    Lack of Transportation (Non-Medical): No  Physical Activity: Inactive (09/25/2024)   Exercise Vital Sign    Days of Exercise per Week: 0 days    Minutes of Exercise per Session: 0 min  Stress: No Stress Concern Present (09/25/2024)   [employer] of Occupational Health - Occupational Stress Questionnaire    Feeling of Stress: Not at all  Social Connections: Socially Isolated (09/25/2024)   Social Connection and Isolation Panel    Frequency of Communication with Friends and Family: More than three times a week    Frequency of Social Gatherings with Friends and Family: More than three times a week    Attends Religious Services: Never    Database Administrator or Organizations: No    Attends Banker Meetings: Never    Marital Status: Widowed    Tobacco Counseling Counseling given: Not Answered    Clinical Intake:  Pre-visit preparation completed: Yes  Pain : No/denies pain     BMI - recorded: 31.93 Nutritional Status: BMI > 30   Obese Nutritional Risks: None Diabetes: No  No results found for: HGBA1C   How often do you need to have someone help you when you read instructions, pamphlets, or other written materials from your doctor or pharmacy?: 1 - Never  Interpreter Needed?: No  Information entered by :: Ellouise Haws, LPN   Activities of Daily Living      09/25/2024   10:19 AM  In your present state of health, do you have any difficulty performing the following activities:  Hearing? 0  Vision? 0  Difficulty concentrating or making decisions? 0  Walking or climbing stairs? 0  Dressing or bathing? 0  Doing errands, shopping? 0  Preparing Food and eating ? N  Using the Toilet? N  In the past six months, have you accidently leaked urine? N  Do you have problems with loss of bowel control? N  Managing your Medications? N  Managing your Finances? N  Housekeeping or managing your Housekeeping? N    Patient Care Team: Lucius Krabbe, NP as PCP - General (Family Medicine) Dann Candyce RAMAN, MD as PCP - Cardiology (Cardiology) Harvey Margo CROME, MD (Inactive) as Consulting Physician (Gastroenterology) Sheldon Standing, MD as Consulting Physician (General Surgery) Cindie Carlin POUR, DO as Consulting Physician (Internal Medicine)   I have updated your Care Teams any recent Medical Services you may have received from other providers in the past year.     Assessment:   This is a routine wellness examination for Zulema.  Hearing/Vision screen Hearing Screening - Comments:: Pt  denies any hearing issues  Vision Screening - Comments:: Wears rx glasses - up to date with routine eye exams with Dr Octavia    Goals Addressed             This Visit's Progress    Patient Stated       Maintain health and activity        Depression Screen      09/25/2024   10:20 AM 09/18/2023    3:32 PM 05/30/2022   11:34 AM 10/15/2021   12:55 PM 10/19/2020  4:35 PM 01/21/2020    2:08 PM  PHQ 2/9 Scores   PHQ - 2 Score 0 1 0 0 0 0  PHQ- 9 Score     0     Fall Risk      09/25/2024   10:23 AM 09/18/2023    3:34 PM 10/17/2022    8:21 AM 05/30/2022   11:36 AM 01/19/2022    3:59 PM  Fall Risk   Falls in the past year? 0 0 0 0 0  Number falls in past yr: 0 0 0 0   Injury with Fall? 0 0 0 0   Risk for fall due to : No Fall Risks Impaired vision No Fall Risks Impaired vision No Fall Risks  Follow up Falls prevention discussed Falls prevention discussed Falls evaluation completed  Falls prevention discussed       Data saved with a previous flowsheet row definition    MEDICARE RISK AT HOME:   Medicare Risk at Home Any stairs in or around the home?: Yes If so, are there any without handrails?: No Home free of loose throw rugs in walkways, pet beds, electrical cords, etc?: Yes Adequate lighting in your home to reduce risk of falls?: Yes Life alert?: No Use of a cane, walker or w/c?: No Grab bars in the bathroom?: No Shower chair or bench in shower?: No Elevated toilet seat or a handicapped toilet?: No  TIMED UP AND GO:  Was the test performed?  No  Cognitive Function: 6CIT completed        09/25/2024   10:24 AM 09/18/2023    3:34 PM 01/21/2020    2:10 PM  6CIT Screen  What Year? 0 points 0 points 0 points  What month? 0 points 0 points 0 points  What time? 0 points 0 points 3 points  Count back from 20 0 points 0 points 0 points  Months in reverse 0 points 0 points 0 points  Repeat phrase 0 points 0 points 0 points  Total Score 0 points 0 points 3 points    Immunizations Immunization History  Administered Date(s) Administered   Fluad Quad(high Dose 65+) 09/24/2019, 09/09/2020, 09/07/2021, 09/19/2022, 09/11/2023   PFIZER(Purple Top)SARS-COV-2 Vaccination 01/14/2020, 02/08/2020, 12/10/2020   PNEUMOCOCCAL CONJUGATE-20 09/11/2023   Pneumococcal Conjugate-13 10/17/2019   Tdap 10/17/2019    Screening Tests Health Maintenance  Topic Date Due   Zoster Vaccines-  Shingrix (1 of 2) Never done   Influenza Vaccine  07/05/2024   COVID-19 Vaccine (4 - 2025-26 season) 08/05/2024   Medicare Annual Wellness (AWV)  09/25/2025   Mammogram  10/05/2025   Colonoscopy  11/07/2027   DTaP/Tdap/Td (2 - Td or Tdap) 10/16/2029   Pneumococcal Vaccine: 50+ Years  Completed   DEXA SCAN  Completed   Hepatitis C Screening  Completed   Meningococcal B Vaccine  Aged Out    Health Maintenance Items Addressed: Vaccines Due: and discussed   Additional Screening:  Vision Screening: Recommended annual ophthalmology exams for early detection of glaucoma and other disorders of the eye. Is the patient up to date with their annual eye exam?  Yes  Who is the provider or what is the name of the office in which the patient attends annual eye exams? Dr Octavia   Dental Screening: Recommended annual dental exams for proper oral hygiene  Community Resource Referral / Chronic Care Management: CRR required this visit?  No   CCM required this visit?  No   Plan:    I have  personally reviewed and noted the following in the patient's chart:   Medical and social history Use of alcohol, tobacco or illicit drugs  Current medications and supplements including opioid prescriptions. Patient is not currently taking opioid prescriptions. Functional ability and status Nutritional status Physical activity Advanced directives List of other physicians Hospitalizations, surgeries, and ER visits in previous 12 months Vitals Screenings to include cognitive, depression, and falls Referrals and appointments  In addition, I have reviewed and discussed with patient certain preventive protocols, quality metrics, and best practice recommendations. A written personalized care plan for preventive services as well as general preventive health recommendations were provided to patient.   Ellouise VEAR Haws, LPN   [id number]   After Visit Summary: (MyChart) Due to this being a telephonic visit, the  after visit summary with patients personalized plan was offered to patient via MyChart   Notes: PCP Follow Up Recommendations: Pt stated it was to expensive to take pitavastatin  calcuim and request for another option

## 2024-10-11 ENCOUNTER — Other Ambulatory Visit: Payer: Self-pay | Admitting: Family

## 2024-10-11 DIAGNOSIS — K219 Gastro-esophageal reflux disease without esophagitis: Secondary | ICD-10-CM

## 2024-10-11 DIAGNOSIS — E039 Hypothyroidism, unspecified: Secondary | ICD-10-CM

## 2024-10-20 ENCOUNTER — Other Ambulatory Visit: Payer: Self-pay | Admitting: Family

## 2024-10-20 DIAGNOSIS — G609 Hereditary and idiopathic neuropathy, unspecified: Secondary | ICD-10-CM

## 2024-11-06 ENCOUNTER — Other Ambulatory Visit (HOSPITAL_COMMUNITY): Payer: Self-pay | Admitting: Family

## 2024-11-06 DIAGNOSIS — Z1231 Encounter for screening mammogram for malignant neoplasm of breast: Secondary | ICD-10-CM

## 2024-11-07 ENCOUNTER — Inpatient Hospital Stay (HOSPITAL_COMMUNITY): Admission: RE | Admit: 2024-11-07 | Discharge: 2024-11-07 | Attending: Family | Admitting: Family

## 2024-11-07 DIAGNOSIS — Z1231 Encounter for screening mammogram for malignant neoplasm of breast: Secondary | ICD-10-CM | POA: Diagnosis present

## 2024-11-13 ENCOUNTER — Telehealth: Payer: Self-pay | Admitting: Radiology

## 2024-11-13 NOTE — Telephone Encounter (Signed)
 Mammography marked for review today.

## 2024-12-03 ENCOUNTER — Other Ambulatory Visit: Payer: Self-pay

## 2024-12-03 ENCOUNTER — Ambulatory Visit
Admission: EM | Admit: 2024-12-03 | Discharge: 2024-12-03 | Disposition: A | Discharge status: Home / Self Care | Attending: Nurse Practitioner | Admitting: Nurse Practitioner

## 2024-12-03 DIAGNOSIS — R000 Tachycardia, unspecified: Secondary | ICD-10-CM

## 2024-12-03 DIAGNOSIS — J208 Acute bronchitis due to other specified organisms: Secondary | ICD-10-CM | POA: Diagnosis not present

## 2024-12-03 LAB — POC SOFIA SARS ANTIGEN FIA: SARS Coronavirus 2 Ag: NEGATIVE

## 2024-12-03 MED ORDER — PROMETHAZINE-DM 6.25-15 MG/5ML PO SYRP: 5.0000 mL | ORAL_SOLUTION | Freq: Four times a day (QID) | ORAL | 0 refills | Status: DC | PRN

## 2024-12-03 MED ORDER — ALBUTEROL SULFATE HFA 108 (90 BASE) MCG/ACT IN AERS: 2.0000 | INHALATION_SPRAY | Freq: Four times a day (QID) | RESPIRATORY_TRACT | 0 refills | Status: AC | PRN

## 2024-12-03 MED ORDER — PREDNISONE 20 MG PO TABS: 40.0000 mg | ORAL_TABLET | Freq: Every day | ORAL | 0 refills | Status: AC

## 2024-12-03 NOTE — Discharge Instructions (Signed)
 The COVID test was negative. Take medication as prescribed. Monitor your heart rate while you are taking the medications.  The albuterol will increase your heart rate along with the prednisone .  If your heart rate remains higher than 110, discontinue the medications immediately and follow-up. You may take over-the-counter Tylenol  as needed for pain, fever, or general discomfort. Recommend the use of a humidifier in your bedroom at nighttime during sleep and sleeping elevated on pillows while symptoms persist. As discussed, your cough may last from days to weeks.  If you continue to have a persistent nagging cough, or better generally feeling well, continue over-the-counter cough medications, fluids, and cough drops.  Seek care if you experience worsening wheezing, shortness of breath, difficulty breathing, or fever. Follow-up as needed.

## 2024-12-03 NOTE — ED Provider Notes (Signed)
 " [facility]    CSN: [id number] Arrival date & time: 12/03/24  1428      History   Chief Complaint Chief Complaint  Patient presents with   Cough    HPI Michele Duncan is a 73 y.o. female.   The history is provided by the patient.   Patient presents for a 5-day history of cough.  She also endorses intermittent wheezing and shortness of breath.  She denies fever, chills, headache, ear pain, difficulty breathing, abdominal pain, nausea, vomiting, diarrhea, or rash.  Patient states she has soreness in her chest and abdomen from the persistent cough.  She denies any obvious close sick contacts.  So far, states she has not taken any medications for her symptoms.  Denies history of smoking, asthma, or COPD.  Patient's heart rate is noted to be 127 during triage.  Patient reports underlying history of anxiety.  States that I am nervous.  Denies shortness of breath, difficulty breathing, lightheadedness, or dizziness.   Past Medical History:  Diagnosis Date   Anxiety    Arthritis    low back   Chronic low back pain    Chronic posterior anal fissure 02/28/2018   Essential hypertension, benign 10/17/2019   External hemorrhoids with pain 02/28/2018   GERD (gastroesophageal reflux disease) 10/17/2019   Grade II internal hemorrhoids    History of adenomatous polyp of colon    11-06-2017   HLD (hyperlipidemia) 10/17/2019   Hot flashes due to menopause 10/17/2019   Inguinal pain 07/13/2021   Rectal bleeding 11/02/2017   Vitamin D  deficiency disease 10/17/2019   Wears glasses     Patient Active Problem List   Diagnosis Date Noted   Obesity (BMI 30-39.9) 12/28/2023   Urinary problem in female 02/21/2023   Hypokalemia due to loss of potassium 01/21/2022   Idiopathic peripheral neuropathy 10/15/2021   Acquired hypothyroidism 10/15/2021   Hormone replacement therapy (HRT) 10/15/2021   Dysesthesia 07/13/2021   Spinal stenosis of lumbar region with neurogenic  claudication 07/13/2021   Essential hypertension, benign 10/17/2019   Vitamin D  deficiency 10/17/2019   HLD (hyperlipidemia) 10/17/2019   GERD (gastroesophageal reflux disease) 10/17/2019    Past Surgical History:  Procedure Laterality Date   BREAST BIOPSY Left 1970s   benign    COLONOSCOPY N/A 11/06/2017   Procedure: COLONOSCOPY;  Surgeon: Harvey Margo CROME, MD;  [facility];  Service: Endoscopy;  Laterality: N/A;  1:45pm   HEMORRHOID SURGERY N/A 02/28/2018   Procedure: HEMORRHOIDECTOMY;  Surgeon: Sheldon Standing, MD;  Location: [facility];  Service: General;  Laterality: N/A;   repair anal sphincter     SPHINCTEROTOMY N/A 02/28/2018   Procedure: LATERAL SPHINCTEROTOMY;  Surgeon: Sheldon Standing, MD;  Location: [facility];  Service: General;  Laterality: N/A;    OB History   No obstetric history on file.      Home Medications    Prior to Admission medications  Medication Sig Start Date End Date Taking? Authorizing Provider  acetaminophen  (TYLENOL ) 650 MG CR tablet Take 650 mg by mouth every 8 (eight) hours as needed for pain.    [provider]  amitriptyline  (ELAVIL ) 50 MG tablet Take 1 tablet (50 mg total) by mouth at bedtime. 06/26/24   Lucius Krabbe, NP  Cholecalciferol (VITAMIN D -3 PO) Take 7,000 Int'l Units by mouth daily.    [provider]  estradiol  (ESTRACE ) 1 MG tablet Take 1 tablet (1 mg total) by mouth daily. 12/28/23  Lucius Krabbe, NP  gabapentin  (NEURONTIN ) 300 MG capsule TAKE 3 CAPSULES BY MOUTH 3 TIMES DAILY 10/21/24   Lucius Krabbe, NP  levothyroxine  (SYNTHROID ) 137 MCG tablet TAKE 1 TABLET BY MOUTH DAILY  BEFORE BREAKFAST 10/11/24   Hudnell, Krabbe, NP  losartan  (COZAAR ) 25 MG tablet TAKE 1 TABLET BY MOUTH DAILY ,  TAKE WITH LOSARTAN /HCTZ TABLET  50-12.5 MG 06/28/24   Hudnell, Krabbe, NP  losartan -hydrochlorothiazide  (HYZAAR) 50-12.5 MG tablet TAKE 1 TABLET BY MOUTH DAILY 06/28/24    Lucius Krabbe, NP  MAGNESIUM PO Take 500 mg by mouth in the morning and at bedtime.    [provider]  naproxen  (NAPROSYN ) 500 MG tablet TAKE 1 TABLET BY MOUTH TWICE  DAILY WITH A MEAL 06/28/24   Hudnell, Krabbe, NP  pantoprazole  (PROTONIX ) 40 MG tablet TAKE 1 TABLET BY MOUTH TWICE  DAILY 10/11/24   Lucius Krabbe, NP  Pitavastatin  Calcium  1 MG TABS Take 1 tablet (1 mg total) by mouth as directed. Start with 1 pill on Monday, Wednesday, Friday for 2 weeks, then 1 pill qod for 2 weeks, then 1 pill daily. Patient not taking: Reported on 09/25/2024 06/26/24   Lucius Krabbe, NP  potassium chloride  SA (KLOR-CON  M) 20 MEQ tablet TAKE 2 TABLETS BY MOUTH IN THE  MORNING 06/28/24   Lucius Krabbe, NP  progesterone  (PROMETRIUM ) 200 MG capsule Take 1 capsule (200 mg total) by mouth at bedtime. 12/28/23   Lucius Krabbe, NP  triamcinolone  cream (KENALOG ) 0.1 % Apply 1 Application topically 2 (two) times daily. 06/26/24   Lucius Krabbe, NP    Family History Family History  Problem Relation Age of Onset   Colon cancer Paternal Grandmother    Breast cancer Mother    Hypertension Father    Heart disease Father    Autoimmune disease Sister    Lung disease Sister    Breast cancer Maternal Aunt    Breast cancer Maternal Grandmother    Colon polyps Neg Hx     Social History Social History[1]   Allergies   Penicillins and Codeine   Review of Systems Review of Systems Per HPI  Physical Exam Triage Vital Signs ED Triage Vitals  Encounter Vitals Group     BP 12/03/24 1506 (!) 146/82     Girls Systolic BP Percentile --      Girls Diastolic BP Percentile --      Boys Systolic BP Percentile --      Boys Diastolic BP Percentile --      Pulse Rate 12/03/24 1506 (!) 127     Resp 12/03/24 1506 18     Temp 12/03/24 1506 98.8 F (37.1 C)     Temp Source 12/03/24 1506 Oral     SpO2 12/03/24 1506 96 %     Weight --      Height --      Head Circumference --       Peak Flow --      Pain Score 12/03/24 1505 0     Pain Loc --      Pain Education --      Exclude from Growth Chart --    No data found.  Updated Vital Signs BP (!) 146/82 (BP Location: Right Arm) Comment: took BP meds at 1pm  Pulse (!) 127   Temp 98.8 F (37.1 C) (Oral)   Resp 18   SpO2 96%   Visual Acuity Right Eye Distance:   Left Eye Distance:   Bilateral Distance:  Right Eye Near:   Left Eye Near:    Bilateral Near:     Physical Exam Vitals and nursing note reviewed.  Constitutional:      General: She is not in acute distress.    Appearance: Normal appearance.  HENT:     Head: Normocephalic.     Right Ear: Tympanic membrane, ear canal and external ear normal.     Left Ear: Tympanic membrane, ear canal and external ear normal.     Nose: Nose normal.     Right Turbinates: Enlarged and swollen.     Left Turbinates: Enlarged and swollen.     Right Sinus: No maxillary sinus tenderness or frontal sinus tenderness.     Left Sinus: No maxillary sinus tenderness or frontal sinus tenderness.     Mouth/Throat:     Lips: Pink.     Mouth: Mucous membranes are moist.     Pharynx: Oropharynx is clear. Uvula midline.  Eyes:     Extraocular Movements: Extraocular movements intact.     Conjunctiva/sclera: Conjunctivae normal.     Pupils: Pupils are equal, round, and reactive to light.  Cardiovascular:     Rate and Rhythm: Regular rhythm. Tachycardia present.     Pulses: Normal pulses.     Heart sounds: Normal heart sounds.  Pulmonary:     Effort: Pulmonary effort is normal. No respiratory distress.     Breath sounds: Normal breath sounds. No stridor. No wheezing, rhonchi or rales.  Abdominal:     General: Bowel sounds are normal.     Palpations: Abdomen is soft.  Musculoskeletal:     Cervical back: Normal range of motion.  Skin:    General: Skin is warm and dry.  Neurological:     General: No focal deficit present.     Mental Status: She is alert and oriented to  person, place, and time.  Psychiatric:        Mood and Affect: Mood normal.        Behavior: Behavior normal.      UC Treatments / Results  Labs (all labs ordered are listed, but only abnormal results are displayed) Labs Reviewed  POC SOFIA SARS ANTIGEN FIA - Normal    EKG: Sinus tachycardia at 109, no STEMI.  Compared to EKGs dated 05/12/2021, 10/12/2020, and 03/24/2018.  Discharge home discharge in 1   Radiology No results found.  Procedures Procedures (including critical care time)  Medications Ordered in UC Medications - No data to display  Initial Impression / Assessment and Plan / UC Course  I have reviewed the triage vital signs and the nursing notes.  Pertinent labs & imaging results that were available during my care of the patient were reviewed by me and considered in my medical decision making (see chart for details).  COVID test is negative.  On exam, the patient's lung sounds are clear throughout, room air sats are at 96%.  The patient is well-appearing, she is in no acute distress, vital signs are stable.  Symptoms are consistent with viral bronchitis.  Will treat with prednisone  40 mg, and albuterol inhaler, and Promethazine  DM for the cough.  Patient was noted to be tachycardic during triage.  Repeat heart rate was 118, EKG performed which shows sinus tachycardia at 109.  Patient was given parameters of when to discontinue the medications prescribed today if her heart rate remains elevated.  In thePatient mother daughter history pdenies chest pain, shortness of breath, or difficulty breathing.  She does report  a prior history of anxiety.  Supportive care recommendations were provided and discussed with the patient to include fluids, rest, over-the-counter analgesics, and use of a humidifier during sleep.  Discussed indications with the patient regarding follow-up.  Patient was in agreement with this plan of care and verbalizes understanding.  All questions were answered.   Patient stable for discharge.    Final Clinical Impressions(s) / UC Diagnoses   Final diagnoses:  None   Discharge Instructions   None    ED Prescriptions   None    PDMP not reviewed this encounter.     [1]  Social History Tobacco Use   Smoking status: Former    Current packs/day: 0.00    Types: Cigarettes    Quit date: 12/05/2013    Years since quitting: 11.0   Smokeless tobacco: Never  Vaping Use   Vaping status: Never Used  Substance Use Topics   Alcohol use: No   Drug use: Not Currently     Gilmer Etta PARAS, NP 12/03/24 1604  "

## 2024-12-03 NOTE — ED Triage Notes (Signed)
 Pt is here with a cough that started 5 days, pt has not taken meds to relieve discomfort.

## 2024-12-27 ENCOUNTER — Ambulatory Visit: Admitting: Family

## 2025-01-02 ENCOUNTER — Ambulatory Visit: Admitting: Family

## 2025-01-09 ENCOUNTER — Encounter: Payer: Self-pay | Admitting: Family

## 2025-01-09 ENCOUNTER — Ambulatory Visit: Admitting: Family

## 2025-01-09 VITALS — BP 144/82 | HR 115 | Temp 98.1°F | Ht 64.0 in | Wt 183.8 lb

## 2025-01-09 DIAGNOSIS — G609 Hereditary and idiopathic neuropathy, unspecified: Secondary | ICD-10-CM

## 2025-01-09 DIAGNOSIS — Z23 Encounter for immunization: Secondary | ICD-10-CM

## 2025-01-09 DIAGNOSIS — I10 Essential (primary) hypertension: Secondary | ICD-10-CM

## 2025-01-09 DIAGNOSIS — Z79890 Hormone replacement therapy (postmenopausal): Secondary | ICD-10-CM

## 2025-01-09 DIAGNOSIS — E782 Mixed hyperlipidemia: Secondary | ICD-10-CM

## 2025-01-09 MED ORDER — ROSUVASTATIN CALCIUM 5 MG PO TABS: 5.0000 mg | ORAL_TABLET | ORAL | 3 refills | Status: AC

## 2025-01-09 MED ORDER — GABAPENTIN 300 MG PO CAPS: 900.0000 mg | ORAL_CAPSULE | Freq: Two times a day (BID) | ORAL | 2 refills | Status: AC

## 2025-01-09 MED ORDER — AMITRIPTYLINE HCL 50 MG PO TABS: 50.0000 mg | ORAL_TABLET | Freq: Every day | ORAL | 3 refills | Status: AC

## 2025-01-09 MED ORDER — PROGESTERONE 200 MG PO CAPS: 200.0000 mg | ORAL_CAPSULE | Freq: Every day | ORAL | 3 refills | Status: AC

## 2025-01-09 MED ORDER — ESTRADIOL 1 MG PO TABS: 1.0000 mg | ORAL_TABLET | Freq: Every day | ORAL | 2 refills | Status: AC

## 2025-01-09 NOTE — Progress Notes (Signed)
 "  Patient ID: Michele Duncan, female    DOB: [birth year], 74 y.o.   MRN: [id number]  Chief Complaint  Patient presents with   Essential hypertension, benign    Follow up   Hormone replacement therapy   idiopathic neuropathy   Hyperlipidemia  Discussed the use of AI scribe software for clinical note transcription with the patient, who gave verbal consent to proceed.  History of Present Illness Michele Duncan is a 74 year old female who presents for medication management.  She has not started Livalo  for hyperlipidemia because it costs over $300 and she lacks a charge card on file. She tried to reach the office for a lower-cost alternative but did not get a response. She has never used a statin and is concerned about both cost and starting this medication. She takes amitriptyline  50 mg at bedtime and can only obtain a 30-day supply due to insurance limits, despite 90-day prescriptions. She uses [facility] because it is cheaper than [facility]. She reduced gabapentin  to 900 mg in the morning and 900 mg at night because she was worried it was affecting her kidneys. She cannot stop it completely due to persistent foot pain. She also takes estradiol , levothyroxine , losartan , losartan  HCTZ, and Protonix .  Assessment & Plan Mixed hyperlipidemia Rosuvastatin  considered due to cost concerns with Livalo .  - Prescribed Rosuvastatin  (Crestor ) at the lowest dose 5mg  with slow titration. - Sent prescription to [facility] pharmacy. - Recheck lipids & CMP in 6 mos  Essential hypertension Blood pressure managed with losartan  and losartan -hydrochlorothiazide , slightly elevated today, lower on 2nd recheck. - Continue both meds daily, no refills needed today.  - F/U 6 mos  HRT Taking Estrace  1mg  qd and Progesterone  200mg  qhs. Denies SE. - Sending refills of both meds to [facility] pharmacy.  Idiopathic peripheral neuropathy Gabapentin  dosage adjusted due to kidney concerns. Taking bid vs tid now. Amitriptyline   continued. - Continue gabapentin  900 mg in the morning and 900 mg at night, refill sent. - Continue amitriptyline  50 mg at bedtime, refill sent to [facility]. - F/U 6mos  Hypothyroidism Levothyroxine  137mcg qd prescription sufficient until year-end. - Continue current levothyroxine  regimen. - F/U in 6 mos  Hormone replacement therapy Estradiol  prescription requires renewal. - Sent prescription for Estrace  1mg  qd  to pharmacy. - F/U in 1 year  Subjective:    Outpatient Medications Prior to Visit  Medication Sig Dispense Refill   acetaminophen  (TYLENOL ) 650 MG CR tablet Take 650 mg by mouth every 8 (eight) hours as needed for pain.     albuterol  (VENTOLIN  HFA) 108 (90 Base) MCG/ACT inhaler Inhale 2 puffs into the lungs every 6 (six) hours as needed. 8 g 0   amitriptyline  (ELAVIL ) 50 MG tablet Take 1 tablet (50 mg total) by mouth at bedtime. 90 tablet 1   Cholecalciferol (VITAMIN D -3 PO) Take 7,000 Int'l Units by mouth daily.     estradiol  (ESTRACE ) 1 MG tablet Take 1 tablet (1 mg total) by mouth daily. 100 tablet 2   gabapentin  (NEURONTIN ) 300 MG capsule TAKE 3 CAPSULES BY MOUTH 3 TIMES DAILY 900 capsule 2   levothyroxine  (SYNTHROID ) 137 MCG tablet TAKE 1 TABLET BY MOUTH DAILY  BEFORE BREAKFAST 100 tablet 2   losartan  (COZAAR ) 25 MG tablet TAKE 1 TABLET BY MOUTH DAILY ,  TAKE WITH LOSARTAN /HCTZ TABLET  50-12.5 MG 100 tablet 2   losartan -hydrochlorothiazide  (HYZAAR) 50-12.5 MG tablet TAKE 1 TABLET BY MOUTH DAILY 100 tablet 2   MAGNESIUM PO Take 500 mg by  mouth in the morning and at bedtime.     naproxen  (NAPROSYN ) 500 MG tablet TAKE 1 TABLET BY MOUTH TWICE  DAILY WITH A MEAL 200 tablet 2   pantoprazole  (PROTONIX ) 40 MG tablet TAKE 1 TABLET BY MOUTH TWICE  DAILY 200 tablet 2   Pitavastatin  Calcium  1 MG TABS Take 1 tablet (1 mg total) by mouth as directed. Start with 1 pill on Monday, Wednesday, Friday for 2 weeks, then 1 pill qod for 2 weeks, then 1 pill daily. 90 tablet 2   potassium  chloride SA (KLOR-CON  M) 20 MEQ tablet TAKE 2 TABLETS BY MOUTH IN THE  MORNING 200 tablet 2   progesterone  (PROMETRIUM ) 200 MG capsule Take 1 capsule (200 mg total) by mouth at bedtime.     triamcinolone  cream (KENALOG ) 0.1 % Apply 1 Application topically 2 (two) times daily. 45 g 2   promethazine -dextromethorphan (PROMETHAZINE -DM) 6.25-15 MG/5ML syrup Take 5 mLs by mouth 4 (four) times daily as needed. 118 mL 0   No facility-administered medications prior to visit.   Past Medical History:  Diagnosis Date   Anxiety    Arthritis    low back   Chronic low back pain    Chronic posterior anal fissure 02/28/2018   Essential hypertension, benign 10/17/2019   External hemorrhoids with pain 02/28/2018   GERD (gastroesophageal reflux disease) 10/17/2019   Grade II internal hemorrhoids    History of adenomatous polyp of colon    11-06-2017   HLD (hyperlipidemia) 10/17/2019   Hot flashes due to menopause 10/17/2019   Inguinal pain 07/13/2021   Rectal bleeding 11/02/2017   Vitamin D  deficiency disease 10/17/2019   Wears glasses    Past Surgical History:  Procedure Laterality Date   BREAST BIOPSY Left 1970s   benign    COLONOSCOPY N/A 11/06/2017   Procedure: COLONOSCOPY;  Surgeon: Harvey Margo CROME, MD;  [facility];  Service: Endoscopy;  Laterality: N/A;  1:45pm   HEMORRHOID SURGERY N/A 02/28/2018   Procedure: HEMORRHOIDECTOMY;  Surgeon: Sheldon Standing, MD;  Location: [facility];  Service: General;  Laterality: N/A;   repair anal sphincter     SPHINCTEROTOMY N/A 02/28/2018   Procedure: LATERAL SPHINCTEROTOMY;  Surgeon: Sheldon Standing, MD;  Location: [facility];  Service: General;  Laterality: N/A;   Allergies[1]    Objective:    Physical Exam Vitals and nursing note reviewed.  Constitutional:      Appearance: Normal appearance. She is obese.  Cardiovascular:     Rate and Rhythm: Normal rate and regular rhythm.  Pulmonary:     Effort:  Pulmonary effort is normal.     Breath sounds: Normal breath sounds.  Musculoskeletal:        General: Normal range of motion.  Skin:    General: Skin is warm and dry.  Neurological:     Mental Status: She is alert.  Psychiatric:        Mood and Affect: Mood normal.        Behavior: Behavior normal.    BP (!) 152/94 (BP Location: Left Arm, Patient Position: Sitting, Cuff Size: Large)   Pulse (!) 115   Temp 98.1 F (36.7 C) (Temporal)   Ht 5' 4 (1.626 m)   Wt 183 lb 12.8 oz (83.4 kg)   SpO2 98%   BMI 31.55 kg/m  Wt Readings from Last 3 Encounters:  01/09/25 183 lb 12.8 oz (83.4 kg)  09/25/24 186 lb (84.4 kg)  06/26/24 186 lb 3.2  oz (84.5 kg)      Avaleigh Decuir, NP     [1]  Allergies Allergen Reactions   Penicillins Shortness Of Breath and Itching    Has patient had a PCN reaction causing immediate rash, facial/tongue/throat swelling, SOB or lightheadedness with hypotension: Yes Has patient had a PCN reaction causing severe rash involving mucus membranes or skin necrosis: No Has patient had a PCN reaction that required hospitalization: No Has patient had a PCN reaction occurring within the last 10 years: No If all of the above answers are NO, then may proceed with Cephalosporin use.    Codeine Itching   "

## 2025-07-10 ENCOUNTER — Ambulatory Visit: Admitting: Family

## 2025-10-06 ENCOUNTER — Ambulatory Visit
